# Patient Record
Sex: Female | Born: 1935 | Race: Black or African American | Hispanic: No | Smoking: Never smoker
Health system: Southern US, Community
[De-identification: ages and names within clinical notes are randomized; demographics above are authoritative.]

## PROBLEM LIST (undated history)

## (undated) DIAGNOSIS — I43 Cardiomyopathy in diseases classified elsewhere: Secondary | ICD-10-CM

## (undated) DIAGNOSIS — A048 Other specified bacterial intestinal infections: Secondary | ICD-10-CM

## (undated) DIAGNOSIS — M1990 Unspecified osteoarthritis, unspecified site: Secondary | ICD-10-CM

## (undated) DIAGNOSIS — I4891 Unspecified atrial fibrillation: Secondary | ICD-10-CM

## (undated) DIAGNOSIS — N289 Disorder of kidney and ureter, unspecified: Secondary | ICD-10-CM

## (undated) DIAGNOSIS — I519 Heart disease, unspecified: Secondary | ICD-10-CM

## (undated) DIAGNOSIS — E7800 Pure hypercholesterolemia, unspecified: Secondary | ICD-10-CM

## (undated) DIAGNOSIS — E785 Hyperlipidemia, unspecified: Secondary | ICD-10-CM

## (undated) DIAGNOSIS — G629 Polyneuropathy, unspecified: Secondary | ICD-10-CM

## (undated) DIAGNOSIS — K559 Vascular disorder of intestine, unspecified: Secondary | ICD-10-CM

## (undated) DIAGNOSIS — I872 Venous insufficiency (chronic) (peripheral): Secondary | ICD-10-CM

## (undated) DIAGNOSIS — K3184 Gastroparesis: Secondary | ICD-10-CM

## (undated) DIAGNOSIS — I10 Essential (primary) hypertension: Secondary | ICD-10-CM

## (undated) DIAGNOSIS — K5792 Diverticulitis of intestine, part unspecified, without perforation or abscess without bleeding: Secondary | ICD-10-CM

## (undated) DIAGNOSIS — E119 Type 2 diabetes mellitus without complications: Secondary | ICD-10-CM

## (undated) DIAGNOSIS — H409 Unspecified glaucoma: Secondary | ICD-10-CM

## (undated) DIAGNOSIS — H3500 Unspecified background retinopathy: Secondary | ICD-10-CM

## (undated) DIAGNOSIS — E11319 Type 2 diabetes mellitus with unspecified diabetic retinopathy without macular edema: Secondary | ICD-10-CM

## (undated) DIAGNOSIS — M8580 Other specified disorders of bone density and structure, unspecified site: Secondary | ICD-10-CM

## (undated) DIAGNOSIS — K5790 Diverticulosis of intestine, part unspecified, without perforation or abscess without bleeding: Secondary | ICD-10-CM

## (undated) DIAGNOSIS — I119 Hypertensive heart disease without heart failure: Secondary | ICD-10-CM

## (undated) DIAGNOSIS — I509 Heart failure, unspecified: Secondary | ICD-10-CM

## (undated) HISTORY — DX: Diverticulosis of intestine, part unspecified, without perforation or abscess without bleeding: K57.90

## (undated) HISTORY — DX: Hyperlipidemia, unspecified: E78.5

## (undated) HISTORY — DX: Unspecified osteoarthritis, unspecified site: M19.90

## (undated) HISTORY — DX: Other specified disorders of bone density and structure, unspecified site: M85.80

## (undated) HISTORY — DX: Unspecified glaucoma: H40.9

## (undated) HISTORY — PX: EXPLORATORY LAPAROTOMY W/ BOWEL RESECTION: SHX1544

## (undated) HISTORY — PX: CATARACT EXTRACTION, BILATERAL: SHX1313

## (undated) HISTORY — DX: Type 2 diabetes mellitus with unspecified diabetic retinopathy without macular edema: E11.319

## (undated) HISTORY — PX: TUBAL LIGATION: SHX77

## (undated) HISTORY — PX: VAGINAL HYSTERECTOMY: SUR661

## (undated) HISTORY — DX: Unspecified background retinopathy: H35.00

## (undated) HISTORY — DX: Diverticulitis of intestine, part unspecified, without perforation or abscess without bleeding: K57.92

## (undated) HISTORY — DX: Polyneuropathy, unspecified: G62.9

## (undated) HISTORY — DX: Disorder of kidney and ureter, unspecified: N28.9

## (undated) HISTORY — DX: Other specified bacterial intestinal infections: A04.8

## (undated) HISTORY — DX: Gastroparesis: K31.84

## (undated) HISTORY — DX: Venous insufficiency (chronic) (peripheral): I87.2

## (undated) HISTORY — DX: Hypertensive heart disease without heart failure: I11.9

## (undated) HISTORY — DX: Cardiomyopathy in diseases classified elsewhere: I43

## (undated) HISTORY — PX: OTHER SURGICAL HISTORY: SHX169

## (undated) HISTORY — PX: COLON SURGERY: SHX602

## (undated) HISTORY — DX: Vascular disorder of intestine, unspecified: K55.9

---

## 2016-07-15 ENCOUNTER — Encounter: Payer: Self-pay | Admitting: Family Medicine

## 2016-09-20 ENCOUNTER — Encounter: Payer: Self-pay | Admitting: Family Medicine

## 2016-09-20 LAB — HM DIABETES EYE EXAM

## 2016-12-23 ENCOUNTER — Ambulatory Visit (HOSPITAL_COMMUNITY)
Admission: EM | Admit: 2016-12-23 | Discharge: 2016-12-23 | Disposition: A | Discharge status: Home / Self Care | Payer: Medicare Other | Attending: Family Medicine | Admitting: Family Medicine

## 2016-12-23 ENCOUNTER — Encounter (HOSPITAL_COMMUNITY): Payer: Self-pay

## 2016-12-23 DIAGNOSIS — R059 Cough, unspecified: Secondary | ICD-10-CM

## 2016-12-23 DIAGNOSIS — J209 Acute bronchitis, unspecified: Secondary | ICD-10-CM

## 2016-12-23 DIAGNOSIS — R05 Cough: Secondary | ICD-10-CM | POA: Diagnosis not present

## 2016-12-23 HISTORY — DX: Essential (primary) hypertension: I10

## 2016-12-23 HISTORY — DX: Pure hypercholesterolemia, unspecified: E78.00

## 2016-12-23 HISTORY — DX: Heart disease, unspecified: I51.9

## 2016-12-23 MED ORDER — AZITHROMYCIN 250 MG PO TABS: 250.0000 mg | ORAL_TABLET | Freq: Every day | ORAL | 0 refills | Status: DC

## 2016-12-23 MED ORDER — PREDNISONE 10 MG PO TABS: 20.0000 mg | ORAL_TABLET | Freq: Every day | ORAL | 0 refills | Status: DC

## 2016-12-23 MED ORDER — BENZONATATE 100 MG PO CAPS: 100.0000 mg | ORAL_CAPSULE | Freq: Three times a day (TID) | ORAL | 0 refills | Status: DC

## 2016-12-23 NOTE — ED Provider Notes (Signed)
CSN: [id number]     Arrival date & time 12/23/16  1046 History   First MD Initiated Contact with Patient 12/23/16 1201     Chief Complaint  Patient presents with  . Cough   (Consider location/radiation/quality/duration/timing/severity/associated sxs/prior Treatment) Patient is having a cough, chills, and sore throat for 2 weeks.   The history is provided by the patient.  Cough  Cough characteristics:  Productive Sputum characteristics:  Yellow Severity:  Moderate Onset quality:  Sudden Duration:  2 weeks Timing:  Constant Progression:  Worsening Chronicity:  New Smoker: yes   Context: upper respiratory infection and weather changes   Relieved by:  Rest Worsened by:  Nothing Ineffective treatments:  None tried Associated symptoms: chills, sinus congestion and wheezing     Past Medical History:  Diagnosis Date  . Diabetes mellitus without complication (HCC)   . Heart disease   . Hypercholesteremia   . Hypertension    History reviewed. No pertinent surgical history. No family history on file. Social History  Substance Use Topics  . Smoking status: Never Smoker  . Smokeless tobacco: Never Used  . Alcohol use No   OB History    No data available     Review of Systems  Constitutional: Positive for chills.  Eyes: Negative.   Respiratory: Positive for cough and wheezing.   Cardiovascular: Negative.   Gastrointestinal: Negative.   Endocrine: Negative.   Genitourinary: Negative.   Musculoskeletal: Negative.   Allergic/Immunologic: Negative.   Neurological: Negative.   Hematological: Negative.   Psychiatric/Behavioral: Negative.     Allergies  Patient has no known allergies.  Home Medications   Prior to Admission medications   Medication Sig Start Date End Date Taking? Authorizing Provider  azithromycin (ZITHROMAX) 250 MG tablet Take 1 tablet (250 mg total) by mouth daily. Take first 2 tablets together, then 1 every day until finished. 12/23/16   Deatra CanterWilliam J  Arnett Duddy, FNP  benzonatate (TESSALON) 100 MG capsule Take 1 capsule (100 mg total) by mouth every 8 (eight) hours. 12/23/16   Deatra CanterWilliam J Lashaunda Schild, FNP  predniSONE (DELTASONE) 10 MG tablet Take 2 tablets (20 mg total) by mouth daily. 12/23/16   Deatra CanterWilliam J Chemeka Filice, FNP   Meds Ordered and Administered this Visit  Medications - No data to display  BP 135/75 (BP Location: Right Arm)   Pulse 79   Temp 98.1 F (36.7 C)   Resp 20   SpO2 99%  No data found.   Physical Exam  Constitutional: She appears well-developed and well-nourished.  HENT:  Head: Normocephalic and atraumatic.  Right Ear: External ear normal.  Left Ear: External ear normal.  Mouth/Throat: Oropharynx is clear and moist.  Eyes: Conjunctivae and EOM are normal. Pupils are equal, round, and reactive to light.  Neck: Normal range of motion. Neck supple.  Cardiovascular: Normal rate, regular rhythm and normal heart sounds.   Pulmonary/Chest: Effort normal. She has wheezes.  Abdominal: Soft. Bowel sounds are normal.  Nursing note and vitals reviewed.   Urgent Care Course     Procedures (including critical care time)  Labs Review Labs Reviewed - No data to display  Imaging Review No results found.   Visual Acuity Review  Right Eye Distance:   Left Eye Distance:   Bilateral Distance:    Right Eye Near:   Left Eye Near:    Bilateral Near:         MDM   1. Cough   2. Acute bronchitis, unspecified organism    Zpak  as directed Prednisone 10mg  2 po qd #10 Tessalon Perles 100mg  one po tid prn #20      Deatra Canter, FNP 12/23/16 1233    Deatra Canter, FNP 12/23/16 1234    Deatra Canter, FNP 12/23/16 1234

## 2016-12-23 NOTE — ED Triage Notes (Signed)
Pt having productive cough, chills, sore throat for about 2 weeks. No fever. Also having wheezing and SOB. Taking tussin.

## 2017-07-28 ENCOUNTER — Encounter: Payer: Self-pay | Admitting: Family Medicine

## 2018-02-26 ENCOUNTER — Encounter: Payer: Self-pay | Admitting: Family Medicine

## 2018-04-05 ENCOUNTER — Encounter: Payer: Self-pay | Admitting: Family Medicine

## 2018-04-16 ENCOUNTER — Encounter: Payer: Self-pay | Admitting: Family Medicine

## 2018-04-16 LAB — HM DIABETES EYE EXAM

## 2018-04-26 ENCOUNTER — Emergency Department (HOSPITAL_COMMUNITY)
Admission: EM | Admit: 2018-04-26 | Discharge: 2018-04-26 | Disposition: A | Discharge status: Home / Self Care | Payer: Medicare Other | Attending: Emergency Medicine | Admitting: Emergency Medicine

## 2018-04-26 ENCOUNTER — Emergency Department (HOSPITAL_COMMUNITY): Payer: Medicare Other

## 2018-04-26 ENCOUNTER — Encounter (HOSPITAL_COMMUNITY): Payer: Self-pay | Admitting: Emergency Medicine

## 2018-04-26 ENCOUNTER — Other Ambulatory Visit: Payer: Self-pay

## 2018-04-26 DIAGNOSIS — I10 Essential (primary) hypertension: Secondary | ICD-10-CM | POA: Insufficient documentation

## 2018-04-26 DIAGNOSIS — K5790 Diverticulosis of intestine, part unspecified, without perforation or abscess without bleeding: Secondary | ICD-10-CM

## 2018-04-26 DIAGNOSIS — K529 Noninfective gastroenteritis and colitis, unspecified: Secondary | ICD-10-CM

## 2018-04-26 DIAGNOSIS — E119 Type 2 diabetes mellitus without complications: Secondary | ICD-10-CM | POA: Insufficient documentation

## 2018-04-26 DIAGNOSIS — R1084 Generalized abdominal pain: Secondary | ICD-10-CM | POA: Insufficient documentation

## 2018-04-26 DIAGNOSIS — R109 Unspecified abdominal pain: Secondary | ICD-10-CM

## 2018-04-26 LAB — COMPREHENSIVE METABOLIC PANEL
ALT: 16 U/L (ref 14–54)
AST: 16 U/L (ref 15–41)
Albumin: 3.3 g/dL — ABNORMAL LOW (ref 3.5–5.0)
Alkaline Phosphatase: 40 U/L (ref 38–126)
Anion gap: 12 (ref 5–15)
BUN: 10 mg/dL (ref 6–20)
CHLORIDE: 99 mmol/L — AB (ref 101–111)
CO2: 26 mmol/L (ref 22–32)
Calcium: 9.3 mg/dL (ref 8.9–10.3)
Creatinine, Ser: 1.24 mg/dL — ABNORMAL HIGH (ref 0.44–1.00)
GFR calc Af Amer: 45 mL/min — ABNORMAL LOW (ref >60)
GFR calc non Af Amer: 39 mL/min — ABNORMAL LOW (ref >60)
GLUCOSE: 193 mg/dL — AB (ref 65–99)
POTASSIUM: 4.1 mmol/L (ref 3.5–5.1)
SODIUM: 137 mmol/L (ref 135–145)
Total Bilirubin: 1.5 mg/dL — ABNORMAL HIGH (ref 0.3–1.2)
Total Protein: 7.2 g/dL (ref 6.5–8.1)

## 2018-04-26 LAB — CBC
HEMATOCRIT: 39.3 % (ref 36.0–46.0)
Hemoglobin: 12.4 g/dL (ref 12.0–15.0)
MCH: 28.1 pg (ref 26.0–34.0)
MCHC: 31.6 g/dL (ref 30.0–36.0)
MCV: 89.1 fL (ref 78.0–100.0)
Platelets: 205 10*3/uL (ref 150–400)
RBC: 4.41 MIL/uL (ref 3.87–5.11)
RDW: 15 % (ref 11.5–15.5)
WBC: 12 10*3/uL — AB (ref 4.0–10.5)

## 2018-04-26 LAB — CBG MONITORING, ED: Glucose-Capillary: 135 mg/dL — ABNORMAL HIGH (ref 65–99)

## 2018-04-26 LAB — LIPASE, BLOOD: LIPASE: 30 U/L (ref 11–51)

## 2018-04-26 MED ORDER — IOHEXOL 300 MG/ML  SOLN
100.0000 mL | Freq: Once | INTRAMUSCULAR | Status: AC
  Administered 2018-04-26: 75 mL via INTRAVENOUS

## 2018-04-26 MED ORDER — CIPROFLOXACIN HCL 500 MG PO TABS: 500.0000 mg | ORAL_TABLET | Freq: Two times a day (BID) | ORAL | 0 refills | Status: AC

## 2018-04-26 MED ORDER — METRONIDAZOLE 500 MG PO TABS
500.0000 mg | ORAL_TABLET | Freq: Two times a day (BID) | ORAL | 0 refills | Status: AC
Start: 2018-04-26 — End: 2018-05-10

## 2018-04-26 MED ORDER — LACTATED RINGERS IV BOLUS
500.0000 mL | Freq: Once | INTRAVENOUS | Status: AC
  Administered 2018-04-26: 500 mL via INTRAVENOUS

## 2018-04-26 NOTE — ED Triage Notes (Signed)
Patient complains of nausea, diarrhea, and abdominal pain since yesterday. Denies vomiting. Reports generalized weakness since after onset of symptoms.

## 2018-04-26 NOTE — ED Notes (Signed)
Patient transported to CT 

## 2018-04-26 NOTE — Progress Notes (Signed)
IV extravasation after injection of 30 ml of saline; in left AC.  Patient had a 20 g Angiocath.  IV removed after injection

## 2018-04-26 NOTE — ED Provider Notes (Signed)
[facility] EMERGENCY DEPARTMENT Provider Note   CSN: [id number] Arrival date & time: 04/26/18  1238   History   Chief Complaint Chief Complaint  Patient presents with  . Abdominal Pain    HPI Chelsea Ward is a 84 y.o. female.  The history is provided by the patient and a relative.  Abdominal Pain   This is a new problem. The current episode started yesterday. The problem occurs constantly. The problem has been gradually worsening. Associated with: diarrhea. The pain is located in the generalized abdominal region. The quality of the pain is cramping. The pain is moderate. Associated symptoms include diarrhea and nausea. Pertinent negatives include fever, vomiting, dysuria, hematuria and arthralgias. Nothing aggravates the symptoms. Nothing relieves the symptoms. Past workup includes surgery (Reports abd surgery last year but unclear what for).    Past Medical History:  Diagnosis Date  . Diabetes mellitus without complication (HCC)   . Heart disease   . Hypercholesteremia   . Hypertension     There are no active problems to display for this patient.   History reviewed. No pertinent surgical history.   OB History   None      Home Medications    Prior to Admission medications   Medication Sig Start Date End Date Taking? Authorizing Provider  azithromycin (ZITHROMAX) 250 MG tablet Take 1 tablet (250 mg total) by mouth daily. Take first 2 tablets together, then 1 every day until finished. 12/23/16   Deatra Canter, FNP  benzonatate (TESSALON) 100 MG capsule Take 1 capsule (100 mg total) by mouth every 8 (eight) hours. 12/23/16   Deatra Canter, FNP  predniSONE (DELTASONE) 10 MG tablet Take 2 tablets (20 mg total) by mouth daily. 12/23/16   Deatra Canter, FNP    Family History No family history on file.  Social History Social History   Tobacco Use  . Smoking status: Never Smoker  . Smokeless tobacco: Never Used  Substance Use Topics  .  Alcohol use: No  . Drug use: Not on file     Allergies   Patient has no known allergies.   Review of Systems Review of Systems  Constitutional: Negative for chills and fever.  HENT: Negative for ear pain and sore throat.   Eyes: Negative for pain and visual disturbance.  Respiratory: Negative for cough and shortness of breath.   Cardiovascular: Negative for chest pain and palpitations.  Gastrointestinal: Positive for abdominal pain, diarrhea and nausea. Negative for vomiting.  Genitourinary: Negative for dysuria and hematuria.  Musculoskeletal: Negative for arthralgias and back pain.  Skin: Negative for color change and rash.  Neurological: Negative for seizures and syncope.  All other systems reviewed and are negative.    Physical Exam Updated Vital Signs BP 107/88   Pulse 98   Temp 97.9 F (36.6 C)   Resp (!) 22   SpO2 96%   Physical Exam  Constitutional: She appears well-developed and well-nourished. No distress.  HENT:  Head: Normocephalic and atraumatic.  Dry mucous membranes  Eyes: Conjunctivae are normal.  Neck: Neck supple.  Cardiovascular: Normal rate and regular rhythm.  No murmur heard. Pulmonary/Chest: Effort normal and breath sounds normal. No respiratory distress.  Abdominal: Soft. There is tenderness.  Mild diffuse abdominal tenderness  Musculoskeletal: She exhibits no edema.  Neurological: She is alert.  Skin: Skin is warm and dry.  Psychiatric: She has a normal mood and affect.  Nursing note and vitals reviewed.    ED Treatments /  Results  Labs (all labs ordered are listed, but only abnormal results are displayed) Labs Reviewed  COMPREHENSIVE METABOLIC PANEL - Abnormal; Notable for the following components:      Result Value   Chloride 99 (*)    Glucose, Bld 193 (*)    Creatinine, Ser 1.24 (*)    Albumin 3.3 (*)    Total Bilirubin 1.5 (*)    GFR calc non Af Amer 39 (*)    GFR calc Af Amer 45 (*)    All other components within normal  limits  CBC - Abnormal; Notable for the following components:   WBC 12.0 (*)    All other components within normal limits  LIPASE, BLOOD  URINALYSIS, ROUTINE W REFLEX MICROSCOPIC    EKG None  Radiology No results found.  Procedures Procedures (including critical care time)  Medications Ordered in ED Medications  lactated ringers bolus 500 mL (500 mLs Intravenous New Bag/Given 04/26/18 1352)     Initial Impression / Assessment and Plan / ED Course  I have reviewed the triage vital signs and the nursing notes.  Pertinent labs & imaging results that were available during my care of the patient were reviewed by me and considered in my medical decision making (see chart for details).    Patient is an 84 year old female with history of hypertension and hyperlipidemia and an unknown abdominal surgery about 1 year ago, who presents with 2 days of diarrhea and generalized abdominal pain.  She also endorses generalized weakness.  She is nauseated but has not had any vomiting.  Here, she is chronically ill-appearing.  She is in no distress.  She has mild diffuse abdominal pain.  Given her age and risk factors, CT abdomen ordered.  Labs are notable for mildly elevated white blood cell count and mildly elevated T bili.  Patient signed out to oncoming team at around 3 PM awaiting CT abdomen and pelvis.  Final Clinical Impressions(s) / ED Diagnoses   Final diagnoses:  Abdominal pain, unspecified abdominal location    ED Discharge Orders    None       Lennette Bihari, MD 04/26/18 1517    Loren Racer, MD 04/27/18 [phone]

## 2018-04-26 NOTE — Progress Notes (Signed)
  Called to CT scan to evaluate left arm for extravasation.  CT tech reports patient was complaining her IV was "stinging" after injection of 30 mL of Saline (NOT Contrast).  The injection was immediately stopped and IV removed.  The patient has some edema of the arm above the AC fossa.  It is soft, non-tender.  She has good ROM in the left hand and fingers.   Good pulses. Neurologically normal.  Recommend ice pack and elevation.  If patient is admitted, will check her arm again tomorrow.  Jermane Brayboy S Kalum Minner PA-C 04/26/2018 4:50 PM

## 2018-04-26 NOTE — ED Provider Notes (Signed)
Assume care from Dr. Alesia Banda at 1500, please see their documentation for complete history and physical.  I have reviewed documentation and agree with previous providers assessment.  Patient is a 84 y.o. with past medical history as below.   Plan is to f/u CT results  Patient CT revealed proctocolitis with diverticulosis.  Will discharge patient with prescription for Cipro Flagyl and instruct her to follow-up for further evaluation and care. Pt given appropriate f/u and return precautions. Pt voiced understanding and is agreeable to discharge at this time.    Past Medical History:  Diagnosis Date  . Diabetes mellitus without complication (HCC)   . Heart disease   . Hypercholesteremia   . Hypertension        Caren Griffins, MD 04/26/18 2354    Gerhard Munch, MD 04/28/18 Perlie Mayo

## 2018-05-15 ENCOUNTER — Emergency Department (HOSPITAL_COMMUNITY): Payer: Medicare Other

## 2018-05-15 ENCOUNTER — Inpatient Hospital Stay (HOSPITAL_COMMUNITY)
Admission: EM | Admit: 2018-05-15 | Discharge: 2018-05-23 | DRG: 371 | Disposition: A | Discharge status: Home / Self Care | Payer: Medicare Other | Attending: Family Medicine | Admitting: Family Medicine

## 2018-05-15 ENCOUNTER — Encounter (HOSPITAL_COMMUNITY): Payer: Self-pay | Admitting: Emergency Medicine

## 2018-05-15 DIAGNOSIS — R0902 Hypoxemia: Secondary | ICD-10-CM

## 2018-05-15 DIAGNOSIS — R531 Weakness: Secondary | ICD-10-CM | POA: Diagnosis not present

## 2018-05-15 DIAGNOSIS — A0472 Enterocolitis due to Clostridium difficile, not specified as recurrent: Secondary | ICD-10-CM | POA: Diagnosis present

## 2018-05-15 DIAGNOSIS — E119 Type 2 diabetes mellitus without complications: Secondary | ICD-10-CM

## 2018-05-15 DIAGNOSIS — R6510 Systemic inflammatory response syndrome (SIRS) of non-infectious origin without acute organ dysfunction: Secondary | ICD-10-CM | POA: Diagnosis present

## 2018-05-15 DIAGNOSIS — E785 Hyperlipidemia, unspecified: Secondary | ICD-10-CM | POA: Diagnosis present

## 2018-05-15 DIAGNOSIS — I493 Ventricular premature depolarization: Secondary | ICD-10-CM | POA: Diagnosis present

## 2018-05-15 DIAGNOSIS — N182 Chronic kidney disease, stage 2 (mild): Secondary | ICD-10-CM | POA: Diagnosis present

## 2018-05-15 DIAGNOSIS — E860 Dehydration: Secondary | ICD-10-CM | POA: Diagnosis present

## 2018-05-15 DIAGNOSIS — E1122 Type 2 diabetes mellitus with diabetic chronic kidney disease: Secondary | ICD-10-CM | POA: Diagnosis present

## 2018-05-15 DIAGNOSIS — I130 Hypertensive heart and chronic kidney disease with heart failure and stage 1 through stage 4 chronic kidney disease, or unspecified chronic kidney disease: Secondary | ICD-10-CM | POA: Diagnosis present

## 2018-05-15 DIAGNOSIS — K529 Noninfective gastroenteritis and colitis, unspecified: Secondary | ICD-10-CM

## 2018-05-15 DIAGNOSIS — Z833 Family history of diabetes mellitus: Secondary | ICD-10-CM

## 2018-05-15 DIAGNOSIS — R000 Tachycardia, unspecified: Secondary | ICD-10-CM | POA: Diagnosis not present

## 2018-05-15 DIAGNOSIS — I5023 Acute on chronic systolic (congestive) heart failure: Secondary | ICD-10-CM | POA: Diagnosis present

## 2018-05-15 DIAGNOSIS — R197 Diarrhea, unspecified: Secondary | ICD-10-CM | POA: Diagnosis present

## 2018-05-15 LAB — CBC WITH DIFFERENTIAL/PLATELET
ABS IMMATURE GRANULOCYTES: 0.1 10*3/uL (ref 0.0–0.1)
BASOS PCT: 0 %
Basophils Absolute: 0 10*3/uL (ref 0.0–0.1)
Eosinophils Absolute: 0 10*3/uL (ref 0.0–0.7)
Eosinophils Relative: 0 %
HCT: 40.9 % (ref 36.0–46.0)
HEMOGLOBIN: 12.5 g/dL (ref 12.0–15.0)
Immature Granulocytes: 1 %
Lymphocytes Relative: 7 %
Lymphs Abs: 0.9 10*3/uL (ref 0.7–4.0)
MCH: 28.5 pg (ref 26.0–34.0)
MCHC: 30.6 g/dL (ref 30.0–36.0)
MCV: 93.2 fL (ref 78.0–100.0)
Monocytes Absolute: 0.7 10*3/uL (ref 0.1–1.0)
Monocytes Relative: 5 %
Neutro Abs: 10.6 10*3/uL — ABNORMAL HIGH (ref 1.7–7.7)
Neutrophils Relative %: 87 %
PLATELETS: 191 10*3/uL (ref 150–400)
RBC: 4.39 MIL/uL (ref 3.87–5.11)
RDW: 16.1 % — ABNORMAL HIGH (ref 11.5–15.5)
WBC: 12.2 10*3/uL — ABNORMAL HIGH (ref 4.0–10.5)

## 2018-05-15 LAB — BASIC METABOLIC PANEL
Anion gap: 14 (ref 5–15)
BUN: 11 mg/dL (ref 6–20)
CHLORIDE: 103 mmol/L (ref 101–111)
CO2: 21 mmol/L — AB (ref 22–32)
CREATININE: 1.16 mg/dL — AB (ref 0.44–1.00)
Calcium: 8.3 mg/dL — ABNORMAL LOW (ref 8.9–10.3)
GFR calc Af Amer: 49 mL/min — ABNORMAL LOW (ref >60)
GFR calc non Af Amer: 42 mL/min — ABNORMAL LOW (ref >60)
Glucose, Bld: 176 mg/dL — ABNORMAL HIGH (ref 65–99)
Potassium: 3.4 mmol/L — ABNORMAL LOW (ref 3.5–5.1)
Sodium: 138 mmol/L (ref 135–145)

## 2018-05-15 LAB — I-STAT CG4 LACTIC ACID, ED: LACTIC ACID, VENOUS: 1.53 mmol/L (ref 0.5–1.9)

## 2018-05-15 LAB — I-STAT TROPONIN, ED: TROPONIN I, POC: 0.03 ng/mL (ref 0.00–0.08)

## 2018-05-15 LAB — I-STAT CHEM 8, ED
BUN: 11 mg/dL (ref 6–20)
Calcium, Ion: 0.9 mmol/L — ABNORMAL LOW (ref 1.15–1.40)
Chloride: 103 mmol/L (ref 101–111)
Creatinine, Ser: 1 mg/dL (ref 0.44–1.00)
GLUCOSE: 187 mg/dL — AB (ref 65–99)
HCT: 39 % (ref 36.0–46.0)
Hemoglobin: 13.3 g/dL (ref 12.0–15.0)
Potassium: 3.3 mmol/L — ABNORMAL LOW (ref 3.5–5.1)
SODIUM: 139 mmol/L (ref 135–145)
TCO2: 23 mmol/L (ref 22–32)

## 2018-05-15 LAB — CBG MONITORING, ED: Glucose-Capillary: 141 mg/dL — ABNORMAL HIGH (ref 65–99)

## 2018-05-15 MED ORDER — SODIUM CHLORIDE 0.9 % IV BOLUS
500.0000 mL | Freq: Once | INTRAVENOUS | Status: AC
  Administered 2018-05-15: 500 mL via INTRAVENOUS

## 2018-05-15 NOTE — ED Triage Notes (Signed)
Pt presents from home with [facility] for generalized weakness that began at around 4-5p; family states she was normal prior to this time; pt now c/o n/v; per family currrenly at baseline

## 2018-05-15 NOTE — ED Provider Notes (Signed)
[facility]   CSN: [id number] Arrival date & time: 05/15/18  2126     History   Chief Complaint Chief Complaint  Patient presents with  . Weakness    HPI Chelsea Ward is a 84 y.o. female.  Patient is a 84 year old female who presents with generalized weakness.  She has a history of diabetes, hypertension, hyperlipidemia.  She was recently seen at the end of May for abdominal pain and diagnosed with proctocolitis and diverticulitis.  She was started on Cipro and Flagyl.  She recently finished her course of those medications.  She was doing well until today.  Her daughter states that she was doing fine earlier this morning but this evening started having some chills and generalized weakness.  She was unable to stand unassisted.  She has had some episodes of vomiting which started since she has arrived in the emergency room.  She is complaining of some abdominal pain which also started today.  She denies any recent diarrhea.  She felt hot but they did not check her temperature.  She denies any urinary symptoms.     Past Medical History:  Diagnosis Date  . Diabetes mellitus without complication (HCC)   . Heart disease   . Hypercholesteremia   . Hypertension     There are no active problems to display for this patient.   History reviewed. No pertinent surgical history.   OB History   None      Home Medications    Prior to Admission medications   Medication Sig Start Date End Date Taking? Authorizing Provider  amoxicillin-clavulanate (AUGMENTIN) 875-125 MG tablet Take 1 tablet by mouth 2 (two) times daily. 04/11/18   [provider]  aspirin EC 81 MG tablet Take 81 mg by mouth daily.    [provider]  azithromycin (ZITHROMAX) 250 MG tablet Take 1 tablet (250 mg total) by mouth daily. Take first 2 tablets together, then 1 every day until finished. Patient not taking: Reported on 04/26/2018 12/23/16    Deatra Canterxford, William J, FNP  benzonatate (TESSALON) 100 MG capsule Take 1 capsule (100 mg total) by mouth every 8 (eight) hours. Patient not taking: Reported on 04/26/2018 12/23/16   Deatra Canterxford, William J, FNP  ezetimibe (ZETIA) 10 MG tablet Take 10 mg by mouth daily. 01/29/18   [provider]  furosemide (LASIX) 20 MG tablet Take 20 mg by mouth daily.    [provider]  JANUVIA 100 MG tablet Take 100 mg by mouth daily. 03/02/18   [provider]  Omega-3 Fatty Acids (FISH OIL) 500 MG CAPS Take 1 capsule by mouth 2 (two) times daily.    [provider]  omeprazole (PRILOSEC) 20 MG capsule Take 20 mg by mouth 2 (two) times daily.    [provider]  predniSONE (DELTASONE) 10 MG tablet Take 2 tablets (20 mg total) by mouth daily. Patient not taking: Reported on 04/26/2018 12/23/16   Deatra Canterxford, William J, FNP    Family History History reviewed. No pertinent family history.  Social History Social History   Tobacco Use  . Smoking status: Never Smoker  . Smokeless tobacco: Never Used  Substance Use Topics  . Alcohol use: No  . Drug use: Not on file     Allergies   Patient has no known allergies.   Review of Systems Review of Systems  Constitutional: Positive for fatigue and fever. Negative for chills and diaphoresis.  HENT: Negative for congestion, rhinorrhea and  sneezing.   Eyes: Negative.   Respiratory: Positive for cough. Negative for chest tightness and shortness of breath.   Cardiovascular: Negative for chest pain and leg swelling.  Gastrointestinal: Positive for abdominal pain, nausea and vomiting. Negative for blood in stool and diarrhea.  Genitourinary: Negative for difficulty urinating, flank pain, frequency and hematuria.  Musculoskeletal: Negative for arthralgias and back pain.  Skin: Negative for rash.  Neurological: Negative for dizziness, speech difficulty, weakness, numbness and headaches.     Physical Exam Updated Vital Signs BP  (!) 156/67   Pulse 94   Temp (!) 102.5 F (39.2 C) (Rectal)   Resp 20   SpO2 96%   Physical Exam  Constitutional: She is oriented to person, place, and time. She appears well-developed and well-nourished. She appears distressed.  HENT:  Head: Normocephalic and atraumatic.  Eyes: Pupils are equal, round, and reactive to light.  Neck: Normal range of motion. Neck supple.  Cardiovascular: Normal rate, regular rhythm and normal heart sounds.  Pulmonary/Chest: Effort normal and breath sounds normal. No respiratory distress. She has no wheezes. She has no rales. She exhibits no tenderness.  Abdominal: Soft. Bowel sounds are normal. There is tenderness (Mild generalized tenderness). There is no rebound and no guarding.  Musculoskeletal: Normal range of motion. She exhibits no edema.  Lymphadenopathy:    She has no cervical adenopathy.  Neurological: She is alert and oriented to person, place, and time.  Skin: Skin is warm and dry. No rash noted.  Psychiatric: She has a normal mood and affect.     ED Treatments / Results  Labs (all labs ordered are listed, but only abnormal results are displayed) Labs Reviewed  CBC WITH DIFFERENTIAL/PLATELET - Abnormal; Notable for the following components:      Result Value   WBC 12.2 (*)    RDW 16.1 (*)    Neutro Abs 10.6 (*)    All other components within normal limits  CBG MONITORING, ED - Abnormal; Notable for the following components:   Glucose-Capillary 141 (*)    All other components within normal limits  I-STAT CHEM 8, ED - Abnormal; Notable for the following components:   Potassium 3.3 (*)    Glucose, Bld 187 (*)    Calcium, Ion 0.90 (*)    All other components within normal limits  CULTURE, BLOOD (ROUTINE X 2)  CULTURE, BLOOD (ROUTINE X 2)  BASIC METABOLIC PANEL  URINALYSIS, ROUTINE W REFLEX MICROSCOPIC  I-STAT TROPONIN, ED  I-STAT CG4 LACTIC ACID, ED  I-STAT CG4 LACTIC ACID, ED    EKG EKG Interpretation  Date/Time:  Tuesday  May 15 2018 22:56:08 EDT Ventricular Rate:  93 PR Interval:    QRS Duration: 86 QT Interval:  324 QTC Calculation: 403 R Axis:   -36 Text Interpretation:  Sinus tachycardia Multiform ventricular premature complexes Abnormal R-wave progression, early transition Left ventricular hypertrophy since last tracing no significant change Confirmed by Rolan Bucco [phone]) on 05/15/2018 11:36:15 PM   Radiology Dg Chest 2 View  Result Date: 05/15/2018 CLINICAL DATA:  Generalized weakness. EXAM: CHEST - 2 VIEW COMPARISON:  None. FINDINGS: Mild cardiomegaly. The hila and mediastinum are unremarkable. No pulmonary nodules or masses. Mild opacity in left base is favored to be atelectasis. No suspicious infiltrate. No overt edema. IMPRESSION: Mild atelectasis in the left base. Electronically Signed   By: Gerome Sam III M.D   On: 05/15/2018 22:34    Procedures Procedures (including critical care time)  Medications Ordered in ED Medications  sodium chloride 0.9 % bolus 500 mL (500 mLs Intravenous New Bag/Given 05/15/18 2331)     Initial Impression / Assessment and Plan / ED Course  I have reviewed the triage vital signs and the nursing notes.  Pertinent labs & imaging results that were available during my care of the patient were reviewed by me and considered in my medical decision making (see chart for details).     Patient is a 84 year old female who presents with fever, generalized weakness and vomiting with abdominal pain.  Chest x-ray is clear without evidence of pneumonia.  Her temperature is 102.5.  Her lactate is normal.  She does not have other sirs criteria for sepsis.  Her urinalysis and remainder of her blood work are still pending.  CT scan of her abdomen pelvis has been ordered and is pending.  Dr. Judd Lien to take over care and follow-up on results.  Final Clinical Impressions(s) / ED Diagnoses   Final diagnoses:  None    ED Discharge Orders    None       Rolan Bucco,  MD 05/15/18 2354

## 2018-05-16 ENCOUNTER — Emergency Department (HOSPITAL_COMMUNITY): Payer: Medicare Other

## 2018-05-16 ENCOUNTER — Encounter (HOSPITAL_COMMUNITY): Payer: Self-pay | Admitting: Internal Medicine

## 2018-05-16 ENCOUNTER — Other Ambulatory Visit: Payer: Self-pay

## 2018-05-16 DIAGNOSIS — N182 Chronic kidney disease, stage 2 (mild): Secondary | ICD-10-CM | POA: Diagnosis present

## 2018-05-16 DIAGNOSIS — R531 Weakness: Secondary | ICD-10-CM | POA: Diagnosis present

## 2018-05-16 DIAGNOSIS — E860 Dehydration: Secondary | ICD-10-CM | POA: Diagnosis present

## 2018-05-16 DIAGNOSIS — E119 Type 2 diabetes mellitus without complications: Secondary | ICD-10-CM

## 2018-05-16 DIAGNOSIS — R0600 Dyspnea, unspecified: Secondary | ICD-10-CM | POA: Diagnosis not present

## 2018-05-16 DIAGNOSIS — I493 Ventricular premature depolarization: Secondary | ICD-10-CM | POA: Diagnosis present

## 2018-05-16 DIAGNOSIS — A09 Infectious gastroenteritis and colitis, unspecified: Secondary | ICD-10-CM

## 2018-05-16 DIAGNOSIS — R6510 Systemic inflammatory response syndrome (SIRS) of non-infectious origin without acute organ dysfunction: Secondary | ICD-10-CM | POA: Diagnosis present

## 2018-05-16 DIAGNOSIS — E785 Hyperlipidemia, unspecified: Secondary | ICD-10-CM | POA: Diagnosis present

## 2018-05-16 DIAGNOSIS — Z833 Family history of diabetes mellitus: Secondary | ICD-10-CM | POA: Diagnosis not present

## 2018-05-16 DIAGNOSIS — A0472 Enterocolitis due to Clostridium difficile, not specified as recurrent: Secondary | ICD-10-CM | POA: Diagnosis present

## 2018-05-16 DIAGNOSIS — I5023 Acute on chronic systolic (congestive) heart failure: Secondary | ICD-10-CM | POA: Diagnosis present

## 2018-05-16 DIAGNOSIS — R000 Tachycardia, unspecified: Secondary | ICD-10-CM | POA: Diagnosis not present

## 2018-05-16 DIAGNOSIS — R197 Diarrhea, unspecified: Secondary | ICD-10-CM

## 2018-05-16 DIAGNOSIS — I130 Hypertensive heart and chronic kidney disease with heart failure and stage 1 through stage 4 chronic kidney disease, or unspecified chronic kidney disease: Secondary | ICD-10-CM | POA: Diagnosis present

## 2018-05-16 DIAGNOSIS — K529 Noninfective gastroenteritis and colitis, unspecified: Secondary | ICD-10-CM | POA: Diagnosis not present

## 2018-05-16 DIAGNOSIS — E1122 Type 2 diabetes mellitus with diabetic chronic kidney disease: Secondary | ICD-10-CM | POA: Diagnosis present

## 2018-05-16 LAB — URINALYSIS, ROUTINE W REFLEX MICROSCOPIC
BILIRUBIN URINE: NEGATIVE
Glucose, UA: 50 mg/dL — AB
Hgb urine dipstick: NEGATIVE
KETONES UR: 20 mg/dL — AB
Nitrite: NEGATIVE
Protein, ur: 30 mg/dL — AB
Specific Gravity, Urine: 1.042 — ABNORMAL HIGH (ref 1.005–1.030)
pH: 5 (ref 5.0–8.0)

## 2018-05-16 LAB — HEPATIC FUNCTION PANEL
ALT: 27 U/L (ref 14–54)
AST: 30 U/L (ref 15–41)
Albumin: 2.9 g/dL — ABNORMAL LOW (ref 3.5–5.0)
Alkaline Phosphatase: 25 U/L — ABNORMAL LOW (ref 38–126)
Bilirubin, Direct: 0.1 mg/dL (ref 0.1–0.5)
Indirect Bilirubin: 0.7 mg/dL (ref 0.3–0.9)
Total Bilirubin: 0.8 mg/dL (ref 0.3–1.2)
Total Protein: 6.7 g/dL (ref 6.5–8.1)

## 2018-05-16 LAB — GLUCOSE, CAPILLARY: Glucose-Capillary: 158 mg/dL — ABNORMAL HIGH (ref 65–99)

## 2018-05-16 LAB — CBC WITH DIFFERENTIAL/PLATELET
Abs Immature Granulocytes: 0 K/uL (ref 0.0–0.1)
Basophils Absolute: 0 K/uL (ref 0.0–0.1)
Basophils Relative: 0 %
Eosinophils Absolute: 0.2 K/uL (ref 0.0–0.7)
Eosinophils Relative: 2 %
HCT: 36.2 % (ref 36.0–46.0)
Hemoglobin: 11.5 g/dL — ABNORMAL LOW (ref 12.0–15.0)
Immature Granulocytes: 0 %
Lymphocytes Relative: 9 %
Lymphs Abs: 0.9 K/uL (ref 0.7–4.0)
MCH: 28.7 pg (ref 26.0–34.0)
MCHC: 31.8 g/dL (ref 30.0–36.0)
MCV: 90.3 fL (ref 78.0–100.0)
Monocytes Absolute: 0.8 K/uL (ref 0.1–1.0)
Monocytes Relative: 9 %
Neutro Abs: 7.4 K/uL (ref 1.7–7.7)
Neutrophils Relative %: 80 %
Platelets: 179 K/uL (ref 150–400)
RBC: 4.01 MIL/uL (ref 3.87–5.11)
RDW: 15.9 % — ABNORMAL HIGH (ref 11.5–15.5)
WBC: 9.3 K/uL (ref 4.0–10.5)

## 2018-05-16 LAB — TSH: TSH: 1.232 u[IU]/mL (ref 0.350–4.500)

## 2018-05-16 LAB — C DIFFICILE QUICK SCREEN W PCR REFLEX
C DIFFICILE (CDIFF) TOXIN: POSITIVE — AB
C Diff antigen: POSITIVE — AB
C Diff interpretation: DETECTED

## 2018-05-16 LAB — BASIC METABOLIC PANEL
Anion gap: 14 (ref 5–15)
BUN: 9 mg/dL (ref 6–20)
CO2: 23 mmol/L (ref 22–32)
Calcium: 8.1 mg/dL — ABNORMAL LOW (ref 8.9–10.3)
Chloride: 103 mmol/L (ref 101–111)
Creatinine, Ser: 1.08 mg/dL — ABNORMAL HIGH (ref 0.44–1.00)
GFR calc Af Amer: 53 mL/min — ABNORMAL LOW (ref >60)
GFR calc non Af Amer: 46 mL/min — ABNORMAL LOW (ref >60)
Glucose, Bld: 212 mg/dL — ABNORMAL HIGH (ref 65–99)
Potassium: 3.3 mmol/L — ABNORMAL LOW (ref 3.5–5.1)
Sodium: 140 mmol/L (ref 135–145)

## 2018-05-16 LAB — LACTIC ACID, PLASMA
LACTIC ACID, VENOUS: 1.6 mmol/L (ref 0.5–1.9)
Lactic Acid, Venous: 1.8 mmol/L (ref 0.5–1.9)

## 2018-05-16 LAB — TROPONIN I: Troponin I: 0.08 ng/mL

## 2018-05-16 MED ORDER — VANCOMYCIN 50 MG/ML ORAL SOLUTION
125.0000 mg | Freq: Four times a day (QID) | ORAL | Status: DC
  Administered 2018-05-16 – 2018-05-23 (×28): 125 mg via ORAL
  Filled 2018-05-16 (×33): qty 2.5

## 2018-05-16 MED ORDER — ONDANSETRON HCL 4 MG PO TABS: 4.0000 mg | ORAL_TABLET | Freq: Four times a day (QID) | ORAL | Status: DC | PRN

## 2018-05-16 MED ORDER — SODIUM CHLORIDE 0.9 % IV SOLN
INTRAVENOUS | Status: DC
  Administered 2018-05-16 (×2): via INTRAVENOUS

## 2018-05-16 MED ORDER — CIPROFLOXACIN IN D5W 400 MG/200ML IV SOLN
400.0000 mg | Freq: Once | INTRAVENOUS | Status: AC
  Administered 2018-05-16: 400 mg via INTRAVENOUS
  Filled 2018-05-16: qty 200

## 2018-05-16 MED ORDER — METRONIDAZOLE IN NACL 5-0.79 MG/ML-% IV SOLN
500.0000 mg | Freq: Three times a day (TID) | INTRAVENOUS | Status: DC
  Administered 2018-05-16: 500 mg via INTRAVENOUS
  Filled 2018-05-16: qty 100

## 2018-05-16 MED ORDER — METRONIDAZOLE IN NACL 5-0.79 MG/ML-% IV SOLN
500.0000 mg | Freq: Once | INTRAVENOUS | Status: AC
  Administered 2018-05-16: 500 mg via INTRAVENOUS
  Filled 2018-05-16: qty 100

## 2018-05-16 MED ORDER — CIPROFLOXACIN IN D5W 400 MG/200ML IV SOLN: 400.0000 mg | Freq: Two times a day (BID) | INTRAVENOUS | Status: DC

## 2018-05-16 MED ORDER — PANTOPRAZOLE SODIUM 40 MG PO TBEC
40.0000 mg | DELAYED_RELEASE_TABLET | Freq: Every day | ORAL | Status: DC
  Administered 2018-05-16: 40 mg via ORAL
  Filled 2018-05-16: qty 1

## 2018-05-16 MED ORDER — ONDANSETRON HCL 4 MG/2ML IJ SOLN: 4.0000 mg | Freq: Four times a day (QID) | INTRAMUSCULAR | Status: DC | PRN

## 2018-05-16 MED ORDER — ACETAMINOPHEN 325 MG PO TABS
650.0000 mg | ORAL_TABLET | Freq: Four times a day (QID) | ORAL | Status: DC | PRN
  Administered 2018-05-16 – 2018-05-22 (×5): 650 mg via ORAL
  Filled 2018-05-16 (×5): qty 2

## 2018-05-16 MED ORDER — OMEGA-3-ACID ETHYL ESTERS 1 G PO CAPS
1.0000 g | ORAL_CAPSULE | Freq: Every day | ORAL | Status: DC
  Administered 2018-05-16 – 2018-05-23 (×8): 1 g via ORAL
  Filled 2018-05-16 (×8): qty 1

## 2018-05-16 MED ORDER — ONDANSETRON HCL 4 MG/2ML IJ SOLN
4.0000 mg | Freq: Once | INTRAMUSCULAR | Status: AC
  Administered 2018-05-16: 4 mg via INTRAVENOUS
  Filled 2018-05-16: qty 2

## 2018-05-16 MED ORDER — ENOXAPARIN SODIUM 40 MG/0.4ML ~~LOC~~ SOLN
40.0000 mg | Freq: Every day | SUBCUTANEOUS | Status: DC
  Administered 2018-05-16 – 2018-05-23 (×8): 40 mg via SUBCUTANEOUS
  Filled 2018-05-16 (×8): qty 0.4

## 2018-05-16 MED ORDER — ACETAMINOPHEN 650 MG RE SUPP: 650.0000 mg | Freq: Four times a day (QID) | RECTAL | Status: DC | PRN

## 2018-05-16 MED ORDER — EZETIMIBE 10 MG PO TABS
10.0000 mg | ORAL_TABLET | Freq: Every day | ORAL | Status: DC
  Administered 2018-05-16 – 2018-05-23 (×8): 10 mg via ORAL
  Filled 2018-05-16 (×8): qty 1

## 2018-05-16 MED ORDER — ASPIRIN EC 81 MG PO TBEC
81.0000 mg | DELAYED_RELEASE_TABLET | Freq: Every day | ORAL | Status: DC
  Administered 2018-05-16 – 2018-05-23 (×8): 81 mg via ORAL
  Filled 2018-05-16 (×8): qty 1

## 2018-05-16 MED ORDER — IOHEXOL 300 MG/ML  SOLN
100.0000 mL | Freq: Once | INTRAMUSCULAR | Status: AC | PRN
  Administered 2018-05-16: 100 mL via INTRAVENOUS

## 2018-05-16 MED ORDER — LINAGLIPTIN 5 MG PO TABS
5.0000 mg | ORAL_TABLET | Freq: Every day | ORAL | Status: DC
  Administered 2018-05-16 – 2018-05-23 (×8): 5 mg via ORAL
  Filled 2018-05-16 (×8): qty 1

## 2018-05-16 NOTE — ED Notes (Signed)
Pt Son Chelsea Ward [phone]

## 2018-05-16 NOTE — ED Notes (Signed)
Heart Healthy/Carb Modified diet dinner tray ordered. 

## 2018-05-16 NOTE — ED Notes (Signed)
Amion text sent to admitting about positive c-diff result

## 2018-05-16 NOTE — ED Notes (Signed)
Did a complete bed change, due to incontinent episode of feces.

## 2018-05-16 NOTE — H&P (Signed)
History and Physical    Chelsea Ward WUJ:[id number] DOB: 1933-11-28 DOA: 05/15/2018  PCP: Patient, No Pcp Per  Patient coming from: Home.  Chief Complaint: Weakness.  HPI: [name] is a 84 y.o. female with history of diabetes mellitus type 2, hyperlipidemia presents to the ER with complaints of having weakness with some subjective feeling of fever chills and abdominal pain.  Patient had come to the ER on Apr 26, 2018 3 weeks ago with abdominal pain and diarrhea at the time was diagnosed with colitis and was placed on Flagyl and Cipro and discharged home.  Patient states since discharge patient diarrhea stopped in 4 days.  Patient felt better following that.  Last 24 hours patient started developing weakness with abdominal discomfort mostly in the left lower quadrant and subjective feeling of fever chills.  Denies any chest pain or shortness of breath.  ED Course: In the ER patient had 4 episodes of watery diarrhea.  CT abdomen and pelvis done shows chronic colitis.  Patient was febrile tachycardic and was placed on IV fluids but culture sent and started on Cipro and Flagyl.  Review of Systems: As per HPI, rest all negative.   Past Medical History:  Diagnosis Date  . Diabetes mellitus without complication (HCC)   . Heart disease   . Hypercholesteremia   . Hypertension     Past Surgical History:  Procedure Laterality Date  . COLON SURGERY    . EYE SURGERY       reports that she has never smoked. She has never used smokeless tobacco. She reports that she does not drink alcohol. Her drug history is not on file.  No Known Allergies  Family History  Problem Relation Age of Onset  . Diabetes Mellitus II Son     Prior to Admission medications   Medication Sig Start Date End Date Taking? Authorizing Provider  aspirin EC 81 MG tablet Take 81 mg by mouth daily.   Yes [provider]  ezetimibe (ZETIA) 10 MG tablet Take 10 mg by mouth daily. 01/29/18  Yes [provider]  furosemide (LASIX) 20 MG tablet Take 20 mg by mouth daily.   Yes [provider]  JANUVIA 100 MG tablet Take 100 mg by mouth daily. 03/02/18  Yes [provider]  Omega-3 Fatty Acids (FISH OIL) 500 MG CAPS Take 1 capsule by mouth 2 (two) times daily.   Yes [provider]  omeprazole (PRILOSEC) 20 MG capsule Take 20 mg by mouth 2 (two) times daily.   Yes [provider]  azithromycin (ZITHROMAX) 250 MG tablet Take 1 tablet (250 mg total) by mouth daily. Take first 2 tablets together, then 1 every day until finished. Patient not taking: Reported on 04/26/2018 12/23/16   Deatra Canter, FNP  benzonatate (TESSALON) 100 MG capsule Take 1 capsule (100 mg total) by mouth every 8 (eight) hours. Patient not taking: Reported on 04/26/2018 12/23/16   Deatra Canter, FNP  predniSONE (DELTASONE) 10 MG tablet Take 2 tablets (20 mg total) by mouth daily. Patient not taking: Reported on 04/26/2018 12/23/16   Deatra Canter, FNP    Physical Exam: Vitals:   05/16/18 0230 05/16/18 0245 05/16/18 0300 05/16/18 0456  BP:  (!) 148/81 135/82 (!) 144/92  Pulse: (!) 118 (!) 110 89 (!) 102  Resp: (!) 22 20 (!) 22 (!) 24  Temp:      TempSrc:      SpO2: 97% 96% 94% 94%  Constitutional: Moderately built and nourished. Vitals:   05/16/18 0230 05/16/18 0245 05/16/18 0300 05/16/18 0456  BP:  (!) 148/81 135/82 (!) 144/92  Pulse: (!) 118 (!) 110 89 (!) 102  Resp: (!) 22 20 (!) 22 (!) 24  Temp:      TempSrc:      SpO2: 97% 96% 94% 94%   Eyes: Anicteric no pallor. ENMT: No discharge from the ears eyes nose or mouth. Neck: No mass felt.  No JVD appreciated. Respiratory: No rhonchi or crepitations. Cardiovascular: S1-S2 heard no murmurs appreciated.  Abdomen: Mild tenderness in the left lower quadrant.  No guarding or rigidity. Musculoskeletal: No edema.  No joint effusion. Skin: No rash. Neurologic: Alert awake oriented to time place and person.  Moves  all extremities. Psychiatric: Appears normal.  Normal affect.   Labs on Admission: I have personally reviewed following labs and imaging studies  CBC: Recent Labs  Lab 05/15/18 2327 05/15/18 2333  WBC 12.2*  --   NEUTROABS 10.6*  --   HGB 12.5 13.3  HCT 40.9 39.0  MCV 93.2  --   PLT 191  --    Basic Metabolic Panel: Recent Labs  Lab 05/15/18 2327 05/15/18 2333  NA 138 139  K 3.4* 3.3*  CL 103 103  CO2 21*  --   GLUCOSE 176* 187*  BUN 11 11  CREATININE 1.16* 1.00  CALCIUM 8.3*  --    GFR: CrCl cannot be calculated (Unknown ideal weight.). Liver Function Tests: No results for input(s): AST, ALT, ALKPHOS, BILITOT, PROT, ALBUMIN in the last 168 hours. No results for input(s): LIPASE, AMYLASE in the last 168 hours. No results for input(s): AMMONIA in the last 168 hours. Coagulation Profile: No results for input(s): INR, PROTIME in the last 168 hours. Cardiac Enzymes: No results for input(s): CKTOTAL, CKMB, CKMBINDEX, TROPONINI in the last 168 hours. BNP (last 3 results) No results for input(s): PROBNP in the last 8760 hours. HbA1C: No results for input(s): HGBA1C in the last 72 hours. CBG: Recent Labs  Lab 05/15/18 2145  GLUCAP 141*   Lipid Profile: No results for input(s): CHOL, HDL, LDLCALC, TRIG, CHOLHDL, LDLDIRECT in the last 72 hours. Thyroid Function Tests: No results for input(s): TSH, T4TOTAL, FREET4, T3FREE, THYROIDAB in the last 72 hours. Anemia Panel: No results for input(s): VITAMINB12, FOLATE, FERRITIN, TIBC, IRON, RETICCTPCT in the last 72 hours. Urine analysis:    Component Value Date/Time   COLORURINE AMBER (A) 05/16/2018 0311   APPEARANCEUR HAZY (A) 05/16/2018 0311   LABSPEC 1.042 (H) 05/16/2018 0311   PHURINE 5.0 05/16/2018 0311   GLUCOSEU 50 (A) 05/16/2018 0311   HGBUR NEGATIVE 05/16/2018 0311   BILIRUBINUR NEGATIVE 05/16/2018 0311   KETONESUR 20 (A) 05/16/2018 0311   PROTEINUR 30 (A) 05/16/2018 0311   NITRITE NEGATIVE 05/16/2018  0311   LEUKOCYTESUR MODERATE (A) 05/16/2018 0311   Sepsis Labs: @LABRCNTIP (procalcitonin:4,lacticidven:4) )No results found for this or any previous visit (from the past 240 hour(s)).   Radiological Exams on Admission: Dg Chest 2 View  Result Date: 05/15/2018 CLINICAL DATA:  Generalized weakness. EXAM: CHEST - 2 VIEW COMPARISON:  None. FINDINGS: Mild cardiomegaly. The hila and mediastinum are unremarkable. No pulmonary nodules or masses. Mild opacity in left base is favored to be atelectasis. No suspicious infiltrate. No overt edema. IMPRESSION: Mild atelectasis in the left base. Electronically Signed   By: Gerome Samavid  Williams III M.D   On: 05/15/2018 22:34   Ct Abdomen Pelvis W Contrast  Result Date: 05/16/2018  CLINICAL DATA:  Generalized weakness starting at 4-5 p.m. EXAM: CT ABDOMEN AND PELVIS WITH CONTRAST TECHNIQUE: Multidetector CT imaging of the abdomen and pelvis was performed using the standard protocol following bolus administration of intravenous contrast. CONTRAST:  OMNIPAQUE IOHEXOL 300 MG/ML  SOLN COMPARISON:  04/26/2018 FINDINGS: Lower chest: Mild cardiomegaly without pericardial effusion or thickening. Dependent atelectasis at the lung bases. Hepatobiliary: Calcifications in the right hepatic dome compatible with old granulomatous disease. No biliary dilatation or space-occupying mass. Tiny too small to characterize hypodensity in the left hepatic lobe consistent with a cyst or hemangioma. This measures 5 mm. Gallbladder is free of stones and wall thickening. No biliary dilatation is noted. Pancreas: Normal Spleen: Normal Adrenals/Urinary Tract: Normal bilateral adrenal glands. Minimal cortical thinning and scarring of the upper poles of both kidneys. Tiny too small to characterize hypodensities within both kidneys are statistically consistent with cysts. No enhancing renal mass lesions, nephrolithiasis nor obstructive uropathy. Nondistended urinary bladder with partial opacification  of the ureter presumably from excretion of IV contrast. Stomach/Bowel: Decompressed stomach. Mild fluid-filled distention of jejunal loops in the left hemiabdomen may represent a mild enteritis. No mechanical bowel obstruction is seen. Patent small bowel anastomosis at the level of the umbilicus. The colon is decompressed in appearance and there is scattered colonic diverticulosis without acute diverticulitis noted. Once again there is mild diffuse transmural thickening of the sigmoid and rectum with moderate fecal retention compatible with chronic mild proctocolitis. Vascular/Lymphatic: Moderate aortoiliac and branch vessel atherosclerosis without adenopathy. Reproductive: Status post hysterectomy. No adnexal masses. Other: No abdominal wall hernia or abnormality. No abdominopelvic ascites. Musculoskeletal: Thoracolumbar spondylosis and facet arthropathy. No aggressive osseous lesions. IMPRESSION: 1. 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 2. Cardiomegaly without active pulmonary disease. 3. Left-sided colonic diverticulosis without acute diverticulitis. 4. Hepatic granulomata at the dome of the right hepatic lobe. 5. Tiny too small to characterize hepatic and renal cysts as before. Electronically Signed   By: Tollie Eth M.D.   On: 05/16/2018 02:21    EKG: Independently reviewed.  Sinus tachycardia with multiple PVCs.  Assessment/Plan Active Problems:   SIRS (systemic inflammatory response syndrome) (HCC)   Diarrhea   Diabetes mellitus type 2 in nonobese (HCC)   HLD (hyperlipidemia)    1. SIRS with possible developing sepsis likely source could be intra-abdominal colitis seen in the CAT scan with patient having persistent diarrhea -patient was on antibiotics prior to last ER visit for eye surgery.  Will check stool for C. difficile and GI pathogen panel.  Continue with hydration Cipro and Flagyl check lactic acid levels follow blood cultures.  UA is pending. 2. Diabetes mellitus  type 2 on Januvia. 3. Hyperlipidemia on Zetia. 4. Chronic kidney disease stage II.  Follow metabolic panel.   DVT prophylaxis: Lovenox. Code Status: Full code. Family Communication: Patient's son. Disposition Plan: Home. Consults called: None. Admission status: Inpatient.     Eduard Clos MD [facility] Pager [phone].  If 7PM-7AM, please contact night-coverage [email or web address] Password [facility]  05/16/2018, 5:16 AM

## 2018-05-16 NOTE — ED Notes (Addendum)
Pt has loose stool, cleaned pt up and fresh pads under pt.

## 2018-05-16 NOTE — ED Notes (Signed)
Lunch tray ordered 

## 2018-05-16 NOTE — ED Notes (Signed)
Attempted to call report

## 2018-05-16 NOTE — Progress Notes (Signed)
Patient seen and examined, admitted earlier this morning by Dr. Toniann FailKakrakandy  Chelsea Ward is an 84 year old African-American female with history of type 2 diabetes, hypertension, dyslipidemia was seen in the emergency room on 5/23 with abdominal pain and diarrhea, found to have colitis discharged home on oral ciprofloxacin and Flagyl with modest clinical improvement, she completed her antibiotic course a few days ago with recurrence of diarrhea presented to the ER overnight with 5+ episodes of diarrhea, abdominal pain, fever, tachycardia, C. Difficile PCR toxin positive. 1. C. Difficile colitis -Start oral vancomycin -Stop ciprofloxacin and Flagyl -IV fluids, supportive care -DC Protonix  Rest of her chronic medical problems are stable  Chelsea CovePreetha Wretha Laris, MD

## 2018-05-17 DIAGNOSIS — A0472 Enterocolitis due to Clostridium difficile, not specified as recurrent: Secondary | ICD-10-CM | POA: Diagnosis present

## 2018-05-17 DIAGNOSIS — K529 Noninfective gastroenteritis and colitis, unspecified: Secondary | ICD-10-CM

## 2018-05-17 LAB — BASIC METABOLIC PANEL
Anion gap: 12 (ref 5–15)
BUN: 13 mg/dL (ref 6–20)
CHLORIDE: 103 mmol/L (ref 101–111)
CO2: 24 mmol/L (ref 22–32)
CREATININE: 1.11 mg/dL — AB (ref 0.44–1.00)
Calcium: 7.9 mg/dL — ABNORMAL LOW (ref 8.9–10.3)
GFR calc Af Amer: 52 mL/min — ABNORMAL LOW (ref >60)
GFR calc non Af Amer: 45 mL/min — ABNORMAL LOW (ref >60)
Glucose, Bld: 122 mg/dL — ABNORMAL HIGH (ref 65–99)
Potassium: 2.8 mmol/L — ABNORMAL LOW (ref 3.5–5.1)
Sodium: 139 mmol/L (ref 135–145)

## 2018-05-17 LAB — GLUCOSE, CAPILLARY
GLUCOSE-CAPILLARY: 130 mg/dL — AB (ref 65–99)
GLUCOSE-CAPILLARY: 120 mg/dL — AB (ref 65–99)
Glucose-Capillary: 112 mg/dL — ABNORMAL HIGH (ref 65–99)
Glucose-Capillary: 161 mg/dL — ABNORMAL HIGH (ref 65–99)

## 2018-05-17 MED ORDER — POTASSIUM CHLORIDE CRYS ER 20 MEQ PO TBCR
40.0000 meq | EXTENDED_RELEASE_TABLET | ORAL | Status: AC
  Administered 2018-05-17 (×3): 40 meq via ORAL
  Filled 2018-05-17 (×3): qty 2

## 2018-05-17 MED ORDER — POLYVINYL ALCOHOL 1.4 % OP SOLN
1.0000 [drp] | Freq: Two times a day (BID) | OPHTHALMIC | Status: DC
  Administered 2018-05-18 – 2018-05-23 (×12): 1 [drp] via OPHTHALMIC
  Filled 2018-05-17: qty 15

## 2018-05-17 NOTE — Progress Notes (Signed)
Patient still having multiple loose bowel movements.

## 2018-05-17 NOTE — Plan of Care (Signed)
  Problem: Education: Goal: Knowledge of General Education information will improve Outcome: Progressing   Problem: Health Behavior/Discharge Planning: Goal: Ability to manage health-related needs will improve Outcome: Progressing   Problem: Activity: Goal: Risk for activity intolerance will decrease Outcome: Progressing   Problem: Nutrition: Goal: Adequate nutrition will be maintained Outcome: Progressing   Problem: Elimination: Goal: Will not experience complications related to bowel motility Outcome: Progressing   Problem: Safety: Goal: Ability to remain free from injury will improve Outcome: Progressing   Problem: Skin Integrity: Goal: Risk for impaired skin integrity will decrease Outcome: Progressing

## 2018-05-17 NOTE — Progress Notes (Signed)
PROGRESS NOTE    Chelsea Ward  OZH:[id number] DOB: 1933-01-19 DOA: 05/15/2018 PCP: Patient, No Pcp Per  Brief Narrative:Chelsea Ward is an 84 year old African-American female with history of type 2 diabetes, hypertension, dyslipidemia was seen in the emergency room on 5/23 with abdominal pain and diarrhea, found to have colitis discharged home on oral ciprofloxacin and Flagyl with modest clinical improvement, she completed her antibiotic course a few days ago with recurrence of diarrhea presented to the ER  with 5+ episodes of diarrhea, abdominal pain, fever, tachycardia, C. Difficile PCR toxin positive.  Assessment & Plan:   C. Difficile colitis -continue oral vancomycin-day 2 -continues to have diarrhea today -Stopped ciprofloxacin and Flagyl -supportive care -stopped Protonix  Diabetes mellitus type 2  -continuing at Linagliptin, CBG stable  Hyperlipidemia -Continue Zetia.  Chronic kidney disease stage II -stable, creatinine at baseline  DVT prophylaxis:Lovenox Code Status: full code Family Communication:per family at bedside Disposition Plan:home in 24-48 hours   Consultants:      Procedures:   Antimicrobials:    Subjective: -continues to have diarrhea, starting to feel little better today  Objective: Vitals:   05/16/18 2127 05/16/18 2129 05/17/18 0538 05/17/18 1145  BP:  140/65 116/60 (!) 114/59  Pulse:  81 98 79  Resp:  18 18   Temp:  99.8 F (37.7 C) 98.2 F (36.8 C) (!) 97.5 F (36.4 C)  TempSrc:  Oral Oral Oral  SpO2:  95% 96% 98%  Weight: 67.8 kg (149 lb 8 oz)  67.8 kg (149 lb 8 oz)   Height: 5\' 4"  (1.626 m)       Intake/Output Summary (Last 24 hours) at 05/17/2018 1358 Last data filed at 05/17/2018 0600 Gross per 24 hour  Intake 2560 ml  Output 2 ml  Net 2558 ml   Filed Weights   05/16/18 2127 05/17/18 0538  Weight: 67.8 kg (149 lb 8 oz) 67.8 kg (149 lb 8 oz)    Examination:  General exam: Appears calm and comfortable  Respiratory  system: Clear to auscultation. Respiratory effort normal. Cardiovascular system: S1 & S2 heard, RRR. No JVD, murmurs Gastrointestinal system: Abdomen is nondistended, soft and  Less tender.Normal bowel sounds heard. Central nervous system: Alert and oriented. No focal neurological deficits. Extremities: Symmetric 5 x 5 power. Skin: No rashes, lesions or ulcers Psychiatry: Judgement and insight appear normal. Mood & affect appropriate.     Data Reviewed:   CBC: Recent Labs  Lab 05/15/18 2327 05/15/18 2333 05/16/18 0543  WBC 12.2*  --  9.3  NEUTROABS 10.6*  --  7.4  HGB 12.5 13.3 11.5*  HCT 40.9 39.0 36.2  MCV 93.2  --  90.3  PLT 191  --  179   Basic Metabolic Panel: Recent Labs  Lab 05/15/18 2327 05/15/18 2333 05/16/18 0543 05/17/18 0623  NA 138 139 140 139  K 3.4* 3.3* 3.3* 2.8*  CL 103 103 103 103  CO2 21*  --  23 24  GLUCOSE 176* 187* 212* 122*  BUN 11 11 9 13   CREATININE 1.16* 1.00 1.08* 1.11*  CALCIUM 8.3*  --  8.1* 7.9*   GFR: Estimated Creatinine Clearance: 36.3 mL/min (A) (by C-G formula based on SCr of 1.11 mg/dL (H)). Liver Function Tests: Recent Labs  Lab 05/16/18 0543  AST 30  ALT 27  ALKPHOS 25*  BILITOT 0.8  PROT 6.7  ALBUMIN 2.9*   No results for input(s): LIPASE, AMYLASE in the last 168 hours. No results for input(s): AMMONIA in the last  168 hours. Coagulation Profile: No results for input(s): INR, PROTIME in the last 168 hours. Cardiac Enzymes: Recent Labs  Lab 05/16/18 0543  TROPONINI 0.08*   BNP (last 3 results) No results for input(s): PROBNP in the last 8760 hours. HbA1C: No results for input(s): HGBA1C in the last 72 hours. CBG: Recent Labs  Lab 05/15/18 2145 05/16/18 2144 05/17/18 0744 05/17/18 1143  GLUCAP 141* 158* 112* 161*   Lipid Profile: No results for input(s): CHOL, HDL, LDLCALC, TRIG, CHOLHDL, LDLDIRECT in the last 72 hours. Thyroid Function Tests: Recent Labs    05/16/18 0544  TSH 1.232   Anemia  Panel: No results for input(s): VITAMINB12, FOLATE, FERRITIN, TIBC, IRON, RETICCTPCT in the last 72 hours. Urine analysis:    Component Value Date/Time   COLORURINE AMBER (A) 05/16/2018 0311   APPEARANCEUR HAZY (A) 05/16/2018 0311   LABSPEC 1.042 (H) 05/16/2018 0311   PHURINE 5.0 05/16/2018 0311   GLUCOSEU 50 (A) 05/16/2018 0311   HGBUR NEGATIVE 05/16/2018 0311   BILIRUBINUR NEGATIVE 05/16/2018 0311   KETONESUR 20 (A) 05/16/2018 0311   PROTEINUR 30 (A) 05/16/2018 0311   NITRITE NEGATIVE 05/16/2018 0311   LEUKOCYTESUR MODERATE (A) 05/16/2018 0311   Sepsis Labs: @LABRCNTIP (procalcitonin:4,lacticidven:4)  ) Recent Results (from the past 240 hour(s))  Culture, blood (routine x 2)     Status: None (Preliminary result)   Collection Time: 05/15/18 11:37 PM  Result Value Ref Range Status   Specimen Description BLOOD RIGHT HAND  Final   Special Requests   Final    BOTTLES DRAWN AEROBIC AND ANAEROBIC Blood Culture adequate volume   Culture   Final    NO GROWTH 1 DAY Performed at [facility], [address] [place], [place] [address]    Report Status PENDING  Incomplete  Culture, blood (routine x 2)     Status: None (Preliminary result)   Collection Time: 05/16/18  3:54 AM  Result Value Ref Range Status   Specimen Description BLOOD RIGHT ARM  Final   Special Requests   Final    BOTTLES DRAWN AEROBIC ONLY Blood Culture adequate volume   Culture   Final    NO GROWTH 1 DAY Performed at [facility], [address] [place], [place] [address]    Report Status PENDING  Incomplete  C difficile quick scan w PCR reflex     Status: Abnormal   Collection Time: 05/16/18  9:37 AM  Result Value Ref Range Status   C Diff antigen POSITIVE (A) NEGATIVE Final   C Diff toxin POSITIVE (A) NEGATIVE Final   C Diff interpretation Toxin producing C. difficile detected.  Final    Comment: CRITICAL RESULT CALLED TO, READ BACK BY AND VERIFIED WITH: Micah FlesherK. Pelkey RN 10:45 05/16/18  ([email or web address]) Performed at [facility], [address] [place], [place] [address]          Radiology Studies: Dg Chest 2 View  Result Date: 05/15/2018 CLINICAL DATA:  Generalized weakness. EXAM: CHEST - 2 VIEW COMPARISON:  None. FINDINGS: Mild cardiomegaly. The hila and mediastinum are unremarkable. No pulmonary nodules or masses. Mild opacity in left base is favored to be atelectasis. No suspicious infiltrate. No overt edema. IMPRESSION: Mild atelectasis in the left base. Electronically Signed   By: Gerome Samavid  Williams III M.D   On: 05/15/2018 22:34   Ct Abdomen Pelvis W Contrast  Result Date: 05/16/2018 CLINICAL DATA:  Generalized weakness starting at 4-5 p.m. EXAM: CT ABDOMEN AND PELVIS WITH CONTRAST  TECHNIQUE: Multidetector CT imaging of the abdomen and pelvis was performed using the standard protocol following bolus administration of intravenous contrast. CONTRAST:  OMNIPAQUE IOHEXOL 300 MG/ML  SOLN COMPARISON:  04/26/2018 FINDINGS: Lower chest: Mild cardiomegaly without pericardial effusion or thickening. Dependent atelectasis at the lung bases. Hepatobiliary: Calcifications in the right hepatic dome compatible with old granulomatous disease. No biliary dilatation or space-occupying mass. Tiny too small to characterize hypodensity in the left hepatic lobe consistent with a cyst or hemangioma. This measures 5 mm. Gallbladder is free of stones and wall thickening. No biliary dilatation is noted. Pancreas: Normal Spleen: Normal Adrenals/Urinary Tract: Normal bilateral adrenal glands. Minimal cortical thinning and scarring of the upper poles of both kidneys. Tiny too small to characterize hypodensities within both kidneys are statistically consistent with cysts. No enhancing renal mass lesions, nephrolithiasis nor obstructive uropathy. Nondistended urinary bladder with partial opacification of the ureter presumably from excretion of IV contrast. Stomach/Bowel: Decompressed stomach. Mild  fluid-filled distention of jejunal loops in the left hemiabdomen may represent a mild enteritis. No mechanical bowel obstruction is seen. Patent small bowel anastomosis at the level of the umbilicus. The colon is decompressed in appearance and there is scattered colonic diverticulosis without acute diverticulitis noted. Once again there is mild diffuse transmural thickening of the sigmoid and rectum with moderate fecal retention compatible with chronic mild proctocolitis. Vascular/Lymphatic: Moderate aortoiliac and branch vessel atherosclerosis without adenopathy. Reproductive: Status post hysterectomy. No adnexal masses. Other: No abdominal wall hernia or abnormality. No abdominopelvic ascites. Musculoskeletal: Thoracolumbar spondylosis and facet arthropathy. No aggressive osseous lesions. IMPRESSION: 1. 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 2. Cardiomegaly without active pulmonary disease. 3. Left-sided colonic diverticulosis without acute diverticulitis. 4. Hepatic granulomata at the dome of the right hepatic lobe. 5. Tiny too small to characterize hepatic and renal cysts as before. Electronically Signed   By: Tollie Eth M.D.   On: 05/16/2018 02:21        Scheduled Meds: . aspirin EC  81 mg Oral Daily  . enoxaparin (LOVENOX) injection  40 mg Subcutaneous Daily  . ezetimibe  10 mg Oral Daily  . linagliptin  5 mg Oral Daily  . omega-3 acid ethyl esters  1 g Oral Daily  . vancomycin  125 mg Oral QID   Continuous Infusions:   LOS: 1 day    Time spent:    Zannie Cove, MD [facility] Page via [email or web address], password [email or web address] After 7PM please contact night-coverage  05/17/2018, 1:58 PM

## 2018-05-17 NOTE — Plan of Care (Signed)
  Problem: Health Behavior/Discharge Planning: Goal: Ability to manage health-related needs will improve Outcome: Progressing   Problem: Clinical Measurements: Goal: Ability to maintain clinical measurements within normal limits will improve Outcome: Progressing   Problem: Elimination: Goal: Will not experience complications related to bowel motility Outcome: Progressing   Problem: Pain Managment: Goal: General experience of comfort will improve Outcome: Progressing   Problem: Skin Integrity: Goal: Risk for impaired skin integrity will decrease Outcome: Progressing

## 2018-05-17 NOTE — Care Management Note (Signed)
Case Management Note  Patient Details  Name: Lorina RabonOdessa Calandro MRN: [id number] Date of Birth: June 17, 1933  Subjective/Objective:      SIRS             Action/Plan: Patient lives at home; has private insurance with [facility] [employer] with prescription drug coverage; CM following for progression of care.  Expected Discharge Date:  05/16/18               Expected Discharge Plan:  Home/Self Care  Discharge planning Services  CM Consult  Status of Service:  In process, will continue to follow  Reola MosherChandler, Trana Ressler L, RN,MHA,BSN [phone] 05/17/2018, 3:38 PM

## 2018-05-18 LAB — GASTROINTESTINAL PANEL BY PCR, STOOL (REPLACES STOOL CULTURE)
Adenovirus F40/41: NOT DETECTED
Astrovirus: NOT DETECTED
CYCLOSPORA CAYETANENSIS: NOT DETECTED
Campylobacter species: NOT DETECTED
Cryptosporidium: NOT DETECTED
Entamoeba histolytica: NOT DETECTED
Enteroaggregative E coli (EAEC): DETECTED — AB
Enteropathogenic E coli (EPEC): NOT DETECTED
Enterotoxigenic E coli (ETEC): NOT DETECTED
Giardia lamblia: NOT DETECTED
Norovirus GI/GII: NOT DETECTED
Plesimonas shigelloides: NOT DETECTED
Rotavirus A: NOT DETECTED
SALMONELLA SPECIES: NOT DETECTED
SAPOVIRUS (I, II, IV, AND V): NOT DETECTED
SHIGA LIKE TOXIN PRODUCING E COLI (STEC): NOT DETECTED
SHIGELLA/ENTEROINVASIVE E COLI (EIEC): NOT DETECTED
VIBRIO SPECIES: NOT DETECTED
Vibrio cholerae: NOT DETECTED
YERSINIA ENTEROCOLITICA: NOT DETECTED

## 2018-05-18 LAB — GLUCOSE, CAPILLARY
GLUCOSE-CAPILLARY: 100 mg/dL — AB (ref 65–99)
Glucose-Capillary: 130 mg/dL — ABNORMAL HIGH (ref 65–99)
Glucose-Capillary: 119 mg/dL — ABNORMAL HIGH (ref 65–99)
Glucose-Capillary: 125 mg/dL — ABNORMAL HIGH (ref 65–99)

## 2018-05-18 LAB — BASIC METABOLIC PANEL
Anion gap: 8 (ref 5–15)
BUN: 11 mg/dL (ref 6–20)
CHLORIDE: 108 mmol/L (ref 101–111)
CO2: 24 mmol/L (ref 22–32)
Calcium: 8 mg/dL — ABNORMAL LOW (ref 8.9–10.3)
Creatinine, Ser: 0.98 mg/dL (ref 0.44–1.00)
GFR calc Af Amer: >60 mL/min (ref >60)
GFR calc non Af Amer: 52 mL/min — ABNORMAL LOW (ref >60)
Glucose, Bld: 89 mg/dL (ref 65–99)
Potassium: 4.3 mmol/L (ref 3.5–5.1)
SODIUM: 140 mmol/L (ref 135–145)

## 2018-05-18 MED ORDER — FUROSEMIDE 10 MG/ML IJ SOLN
20.0000 mg | Freq: Once | INTRAMUSCULAR | Status: AC
  Administered 2018-05-18: 20 mg via INTRAVENOUS
  Filled 2018-05-18: qty 2

## 2018-05-18 MED ORDER — POTASSIUM CHLORIDE CRYS ER 20 MEQ PO TBCR
40.0000 meq | EXTENDED_RELEASE_TABLET | Freq: Two times a day (BID) | ORAL | Status: AC
  Administered 2018-05-18 (×2): 40 meq via ORAL
  Filled 2018-05-18 (×2): qty 2

## 2018-05-18 NOTE — Progress Notes (Signed)
Occupational Therapy Note  Full OT eval completed, full eval to follow.  Recommend 24 hour supervision and HHOT at discharge.  Jeani HawkingWendi Demitra Danley, OTR/L [phone]

## 2018-05-18 NOTE — Progress Notes (Signed)
Pt continues to have discomfort of GI symptoms but is mobile enough to potentially go home.  However if her family cannot care for her may need SNF stay.  Her plan is to continue on with acute therapy for strength and gait, continue short walks as at home and progress to hopefully return there with HHPT to see for same.        05/17/18 2200  PT Visit Information  Last PT Received On 05/17/18  Assistance Needed +1  History of Present Illness 84 yo female was admitted after having SIRS with sepsis, Flat troponin, atelectasis and pericardial effusion.  Has (+) c-diff tox screen, and will be sent to SNF if family cannot care for her.  PMHx:  DM, HTN, CKD 2  Subjective Data  Patient Stated Goal none stated  Precautions  Precautions Fall (teleemtry)  Restrictions  Weight Bearing Restrictions No  Pain Assessment  Pain Assessment No/denies pain  Cognition  Arousal/Alertness Awake/alert  Behavior During Therapy WFL for tasks assessed/performed  Overall Cognitive Status Within Functional Limits for tasks assessed  General Comments pt is familiar with most history and son is there for any details  Bed Mobility  Overal bed mobility Needs Assistance  Bed Mobility Supine to Sit;Sit to Supine  Supine to sit Min assist  Sit to supine Mod assist  Transfers  Overall transfer level Modified independent  Equipment used Rolling walker (2 wheeled);1 person hand held assist  General transfer comment reminders to use bed to push up  Ambulation/Gait  Ambulation/Gait assistance Min assist  Gait Distance (Feet) 30 Feet  Assistive device Rolling walker (2 wheeled)  Gait Pattern/deviations Step-through pattern;Decreased stride length;Wide base of support;Trunk flexed  Gait velocity reduced  Gait velocity interpretation <1.8 ft/sec, indicate of risk for recurrent falls  Balance  Overall balance assessment Needs assistance  Sitting-balance support Feet supported  Sitting balance-Leahy Scale Fair   Standing balance support Bilateral upper extremity supported;During functional activity  Standing balance-Leahy Scale Poor  Exercises  Exercises Other exercises (Pt had 4- to 4 LE strength)  PT - End of Session  Equipment Utilized During Treatment Gait belt  Activity Tolerance Patient tolerated treatment well;Patient limited by fatigue  Patient left in bed;with call bell/phone within reach;with bed alarm set  Nurse Communication Mobility status;Other (comment) (GI issues)   PT - Assessment/Plan  PT Visit Diagnosis Unsteadiness on feet (R26.81);Other abnormalities of gait and mobility (R26.89);Muscle weakness (generalized) (M62.81);Ataxic gait (R26.0);Difficulty in walking, not elsewhere classified (R26.2);Adult, failure to thrive (R62.7)  PT Frequency (ACUTE ONLY) Min 3X/week  Follow Up Recommendations Home health PT (providing family can care for her)  PT equipment Rolling walker with 5" wheels  AM-PAC PT "6 Clicks" Daily Activity Outcome Measure  Difficulty turning over in bed (including adjusting bedclothes, sheets and blankets)? 1  Difficulty moving from lying on back to sitting on the side of the bed?  1  Difficulty sitting down on and standing up from a chair with arms (e.g., wheelchair, bedside commode, etc,.)? 1  Help needed moving to and from a bed to chair (including a wheelchair)? 3  Help needed walking in hospital room? 3  Help needed climbing 3-5 steps with a railing?  2  6 Click Score 11  Mobility G Code  CL  Acute Rehab PT Goals  PT Goal Formulation With patient  Time For Goal Achievement 05/31/18  Potential to Achieve Goals Good  PT Time Calculation  PT Start Time (ACUTE ONLY) 1425  PT Stop Time (ACUTE ONLY)  1458  PT Time Calculation (min) (ACUTE ONLY) 33 min  PT G-Codes **NOT FOR INPATIENT CLASS**  Functional Assessment Tool Used AM-PAC 6 Clicks Basic Mobility  PT General Charges  $$ ACUTE PT VISIT 1 Visit  PT Evaluation  $PT Eval Moderate Complexity 1 Mod   PT Treatments  $Gait Training 8-22 mins    Chelsea Ward, PT MS Acute Rehab Dept. Number: [facility] [id number] and [facility] [phone]

## 2018-05-18 NOTE — Progress Notes (Signed)
11:300am- Patient's HR 150's non sustained while working with OT.

## 2018-05-18 NOTE — Progress Notes (Signed)
Physical Therapy Treatment Patient Details Name: Chelsea RabonOdessa Ward MRN: [id number] DOB: 1933/10/14 Today's Date: 05/18/2018    History of Present Illness 84 yo female was admitted after having SIRS with sepsis, Flat troponin, atelectasis and pericardial effusion.  Has (+) c-diff tox screen, and will be sent to SNF if family cannot care for her.  PMHx:  DM, HTN, CKD 2    PT Comments    Pt was seen for ther exercises and declined gait due to fatigue from having just returned to bed.  Her plan is to continue mobility and progress gait and balance skills as able to decrease fall risk.  Her hope is to return home with son, and will continue to require HHPT intervention.  Will plan to focus on these goals as planned.  Follow Up Recommendations  Home health PT     Equipment Recommendations  Rolling walker with 5" wheels    Recommendations for Other Services       Precautions / Restrictions Precautions Precautions: Fall Restrictions Weight Bearing Restrictions: No    Mobility  Bed Mobility                  Transfers                    Ambulation/Gait             General Gait Details: declined   Stairs             Wheelchair Mobility    Modified Rankin (Stroke Patients Only)       Balance Overall balance assessment: Needs assistance Sitting-balance support: Feet supported Sitting balance-Leahy Scale: Fair                                      Cognition Arousal/Alertness: Awake/alert Behavior During Therapy: WFL for tasks assessed/performed Overall Cognitive Status: No family/caregiver present to determine baseline cognitive functioning                                        Exercises General Exercises - Lower Extremity Ankle Circles/Pumps: AAROM;Both;5 reps Quad Sets: AROM;Both;15 reps Gluteal Sets: AROM;Both;15 reps Heel Slides: AROM;Left;AAROM;15 reps Hip ABduction/ADduction: AROM;Both;20 reps Straight  Leg Raises: AAROM;Both;10 reps    General Comments        Pertinent Vitals/Pain Pain Assessment: Faces Faces Pain Scale: Hurts little more Pain Location: abdomen Pain Descriptors / Indicators: Discomfort;Cramping Pain Intervention(s): Limited activity within patient's tolerance;Monitored during session;Premedicated before session;Repositioned;Patient requesting pain meds-RN notified;Relaxation    Home Living                      Prior Function            PT Goals (current goals can now be found in the care plan section) Acute Rehab PT Goals Patient Stated Goal: to go home with son  PT Goal Formulation: With patient    Frequency    Min 3X/week      PT Plan Current plan remains appropriate    Co-evaluation              AM-PAC PT "6 Clicks" Daily Activity  Outcome Measure  Difficulty turning over in bed (including adjusting bedclothes, sheets and blankets)?: Unable Difficulty moving from lying on back to sitting on the side of  the bed? : Unable Difficulty sitting down on and standing up from a chair with arms (e.g., wheelchair, bedside commode, etc,.)?: Unable Help needed moving to and from a bed to chair (including a wheelchair)?: A Little Help needed walking in hospital room?: A Little Help needed climbing 3-5 steps with a railing? : A Little 6 Click Score: 12    End of Session   Activity Tolerance: Patient tolerated treatment well Patient left: in bed;with call bell/phone within reach;with bed alarm set Nurse Communication: Mobility status PT Visit Diagnosis: Unsteadiness on feet (R26.81);Other abnormalities of gait and mobility (R26.89);Muscle weakness (generalized) (M62.81);Ataxic gait (R26.0);Difficulty in walking, not elsewhere classified (R26.2);Adult, failure to thrive (R62.7)     Time: 1610-9604 PT Time Calculation (min) (ACUTE ONLY): 16 min  Charges:  $Therapeutic Exercise: 8-22 mins                    G Codes:  Functional Assessment  Tool Used: AM-PAC 6 Clicks Basic Mobility     Ivar Drape 05/18/2018, 10:28 PM   Samul Dada, PT MS Acute Rehab Dept. Number: [facility] [id number] and [facility] [phone]

## 2018-05-18 NOTE — Evaluation (Signed)
Occupational Therapy Evaluation Patient Details Name: Chelsea Ward MRN: [id number] DOB: 1933/01/01 Today's Date: 05/18/2018    History of Present Illness 84 yo female was admitted after having SIRS with sepsis, Flat troponin, atelectasis and pericardial effusion.  Has (+) c-diff tox screen, and will be sent to SNF if family cannot care for her.  PMHx:  DM, HTN, CKD 2   Clinical Impression   Pt admitted with above. She demonstrates the below listed deficits and will benefit from continued OT to maximize safety and independence with BADLs.  Pt presents to OT with generalized weakness, decreased activity tolerance, impaired balance. She is slow to process info and to initiate activity.  She currently requires min guard - min A for ADLs.  She reports she lives with son, who is able to provide necessary level of assist at discharge.  Pt with dizziness with sit to stand - see below re; BP readings.   RN alerted OT to increased HR.  Recommend 24 hour assist at discharge.        Follow Up Recommendations  Home health OT;Supervision/Assistance - 24 hour    Equipment Recommendations  None recommended by OT    Recommendations for Other Services       Precautions / Restrictions Precautions Precautions: Fall      Mobility Bed Mobility Overal bed mobility: Needs Assistance Bed Mobility: Supine to Sit     Supine to sit: Min guard;HOB elevated     General bed mobility comments: requires increased time and cues to progress   Transfers Overall transfer level: Needs assistance Equipment used: Rolling walker (2 wheeled) Transfers: Sit to/from [employer] Sit to Stand: Min guard Stand pivot transfers: Min guard       General transfer comment: pt with posterior bias when moving sit to stand - leans back on heels, but is able to self correct when given opportunity     Balance Overall balance assessment: Needs assistance Sitting-balance support: Feet supported Sitting  balance-Leahy Scale: Fair     Standing balance support: Single extremity supported;During functional activity Standing balance-Leahy Scale: Poor Standing balance comment: reliant on UE support                            ADL either performed or assessed with clinical judgement   ADL Overall ADL's : Needs assistance/impaired Eating/Feeding: Independent   Grooming: Wash/dry hands;Oral care;Min guard;Standing   Upper Body Bathing: Set up;Sitting   Lower Body Bathing: Minimal assistance;Sit to/from stand Lower Body Bathing Details (indicate cue type and reason): difficulty accessing feet  Upper Body Dressing : Set up;Sitting   Lower Body Dressing: Moderate assistance;Sit to/from stand Lower Body Dressing Details (indicate cue type and reason): unable to don Rt sock due to abdominal cramping  Toilet Transfer: Min guard;Ambulation;Comfort height toilet;Grab bars;RW   Toileting- Clothing Manipulation and Hygiene: Minimal assistance;Sit to/from stand Toileting - Clothing Manipulation Details (indicate cue type and reason): assist for thoroughness      Functional mobility during ADLs: Min guard;Rolling walker       Vision         Perception     Praxis      Pertinent Vitals/Pain Pain Assessment: Faces Faces Pain Scale: Hurts little more Pain Location: abdomen  Pain Descriptors / Indicators: Cramping;Grimacing;Guarding Pain Intervention(s): Monitored during session;Repositioned;Limited activity within patient's tolerance     Hand Dominance Right   Extremity/Trunk Assessment Upper Extremity Assessment Upper Extremity Assessment: Overall WFL for tasks  assessed   Lower Extremity Assessment Lower Extremity Assessment: Defer to PT evaluation   Cervical / Trunk Assessment Cervical / Trunk Assessment: Normal   Communication Communication Communication: HOH   Cognition Arousal/Alertness: Awake/alert Behavior During Therapy: WFL for tasks  assessed/performed Overall Cognitive Status: No family/caregiver present to determine baseline cognitive functioning                                 General Comments: Pt is slow to respond and to engage  Unsure of her baseline, and if this may be due to hearing loss    General Comments  HR to 143 with activity.  Pt with complaint of dizziness when standing. BP seated 119/73; standing 107/75.  RN aware     Exercises     Shoulder Instructions      Home Living Family/patient expects to be discharged to:: Private residence Living Arrangements: Children Available Help at Discharge: Family Type of Home: House Home Access: Stairs to enter Secretary/administrator of Steps: 6 Entrance Stairs-Rails: Right;Left Home Layout: One level     Bathroom Shower/Tub: Chief Strategy Officer: Standard     Home Equipment: Environmental consultant - 4 wheels;Cane - single point;Grab bars - tub/shower;Grab bars - toilet   Additional Comments: Pt lives with son.  Per pt report, son is with her most of the day      Prior Functioning/Environment Level of Independence: Independent with assistive device(s);Needs assistance        Comments: Pt reports she ambulates with a SPC.  Son assists with transfers into tub and occasionally with socks/stockings.  She reports she goes to church on sundays and bible study on [address].         OT Problem List: Decreased strength;Decreased activity tolerance;Impaired balance (sitting and/or standing);Decreased knowledge of use of DME or AE      OT Treatment/Interventions: Self-care/ADL training;Therapeutic exercise;DME and/or AE instruction;Therapeutic activities;Balance training;Patient/family education    OT Goals(Current goals can be found in the care plan section) Acute Rehab OT Goals Patient Stated Goal: to go home with son  OT Goal Formulation: With patient Time For Goal Achievement: 06/01/18 Potential to Achieve Goals: Good ADL Goals Pt Will  Perform Grooming: with supervision;standing Pt Will Perform Upper Body Bathing: with set-up;with supervision;sitting Pt Will Perform Lower Body Bathing: with supervision;sit to/from stand Pt Will Perform Upper Body Dressing: with supervision;standing Pt Will Perform Lower Body Dressing: with supervision;sit to/from stand Pt Will Transfer to Toilet: with supervision;ambulating;bedside commode;grab bars Pt Will Perform Toileting - Clothing Manipulation and hygiene: sit to/from stand Pt Will Perform Tub/Shower Transfer: with min guard assist;ambulating;shower seat;rolling walker;grab bars  OT Frequency: Min 2X/week   Barriers to D/C:            Co-evaluation              AM-PAC PT "6 Clicks" Daily Activity     Outcome Measure Help from another person eating meals?: None Help from another person taking care of personal grooming?: A Little Help from another person toileting, which includes using toliet, bedpan, or urinal?: A Little Help from another person bathing (including washing, rinsing, drying)?: A Little Help from another person to put on and taking off regular upper body clothing?: A Little Help from another person to put on and taking off regular lower body clothing?: A Little 6 Click Score: 19   End of Session Equipment Utilized During Treatment: Rolling walker  Activity Tolerance: Patient tolerated treatment well Patient left: in chair;with call bell/phone within reach  OT Visit Diagnosis: Unsteadiness on feet (R26.81)                Time: [id number] OT Time Calculation (min): 37 min Charges:  OT General Charges $OT Visit: 1 Visit OT Evaluation $OT Eval Moderate Complexity: 1 Mod OT Treatments $Self Care/Home Management : 8-22 mins G-Codes:     Reynolds AmericanWendi Joleen Stuckert, OTR/L [phone]   Jeani HawkingConarpe, Daleiza Bacchi M 05/18/2018, 4:09 PM

## 2018-05-18 NOTE — Progress Notes (Signed)
PROGRESS NOTE    Rhiannon Sassaman  ZOX:[id number] DOB: Jan 22, 1933 DOA: 05/15/2018 PCP: Patient, No Pcp Per  Brief Narrative:Ms. Kindel is an 84 year old African-American female with history of type 2 diabetes, hypertension, dyslipidemia was seen in the emergency room on 5/23 with abdominal pain and diarrhea, found to have colitis discharged home on oral ciprofloxacin and Flagyl with modest clinical improvement, she completed her antibiotic course a few days ago with recurrence of diarrhea presented to the ER  with 5+ episodes of diarrhea, abdominal pain, fever, tachycardia, C. Difficile PCR toxin positive.  Assessment & Plan:   C. Difficile colitis -continue oral vancomycin-day 3 -continues to have some diarrhea however severity is improving -with Cipro and Flagyl were discontinued on admission, GI pathogen panel positive for anteroapical irrigated Escherichia coli- clinical significance of this is unclear in the current setting, called and d/w ID Dr.Comer -likley false positive will not Rx this -supportive care -stopped Protonix  Diabetes mellitus type 2  -continuing at Linagliptin, CBG stable  Hyperlipidemia -Continue Zetia.  Chronic kidney disease stage II -stable, creatinine at baseline  Volume overloaded -likely iatrogenic from IV fluid administration due to diarrhea and dehydration initially -Lasix 1 today with potassium -Monitor clinical response  DVT prophylaxis:Lovenox Code Status: full code Family Communication:per family at bedside Disposition Plan:home in 24-48 hours   Consultants:      Procedures:   Antimicrobials:    Subjective: -continues to have diarrhea, starting to feel little better today  Objective: Vitals:   05/18/18 0525 05/18/18 0621 05/18/18 0830 05/18/18 1124  BP: 137/77  138/86 103/63  Pulse: 72  72 83  Resp: 20  14   Temp: 98.3 F (36.8 C)   (!) 97.5 F (36.4 C)  TempSrc: Oral   Oral  SpO2: 99%   100%  Weight:  65.9 kg (145 lb 4.5  oz)    Height:        Intake/Output Summary (Last 24 hours) at 05/18/2018 1340 Last data filed at 05/18/2018 1224 Gross per 24 hour  Intake 560 ml  Output 1027 ml  Net -467 ml   Filed Weights   05/16/18 2127 05/17/18 0538 05/18/18 0621  Weight: 67.8 kg (149 lb 8 oz) 67.8 kg (149 lb 8 oz) 65.9 kg (145 lb 4.5 oz)    Examination:  General exam: Appears calm and comfortable  Respiratory system: Clear to auscultation. Respiratory effort normal. Cardiovascular system: S1 & S2 heard, RRR. No JVD, murmurs Gastrointestinal system: Abdomen is nondistended, soft and  Less tender.Normal bowel sounds heard. Central nervous system: Alert and oriented. No focal neurological deficits. Extremities: Symmetric 5 x 5 power. Skin: No rashes, lesions or ulcers Psychiatry: Judgement and insight appear normal. Mood & affect appropriate.     Data Reviewed:   CBC: Recent Labs  Lab 05/15/18 2327 05/15/18 2333 05/16/18 0543  WBC 12.2*  --  9.3  NEUTROABS 10.6*  --  7.4  HGB 12.5 13.3 11.5*  HCT 40.9 39.0 36.2  MCV 93.2  --  90.3  PLT 191  --  179   Basic Metabolic Panel: Recent Labs  Lab 05/15/18 2327 05/15/18 2333 05/16/18 0543 05/17/18 0623 05/18/18 0513  NA 138 139 140 139 140  K 3.4* 3.3* 3.3* 2.8* 4.3  CL 103 103 103 103 108  CO2 21*  --  23 24 24   GLUCOSE 176* 187* 212* 122* 89  BUN 11 11 9 13 11   CREATININE 1.16* 1.00 1.08* 1.11* 0.98  CALCIUM 8.3*  --  8.1* 7.9*  8.0*   GFR: Estimated Creatinine Clearance: 40.6 mL/min (by C-G formula based on SCr of 0.98 mg/dL). Liver Function Tests: Recent Labs  Lab 05/16/18 0543  AST 30  ALT 27  ALKPHOS 25*  BILITOT 0.8  PROT 6.7  ALBUMIN 2.9*   No results for input(s): LIPASE, AMYLASE in the last 168 hours. No results for input(s): AMMONIA in the last 168 hours. Coagulation Profile: No results for input(s): INR, PROTIME in the last 168 hours. Cardiac Enzymes: Recent Labs  Lab 05/16/18 0543  TROPONINI 0.08*   BNP (last 3  results) No results for input(s): PROBNP in the last 8760 hours. HbA1C: No results for input(s): HGBA1C in the last 72 hours. CBG: Recent Labs  Lab 05/17/18 1143 05/17/18 1651 05/17/18 2123 05/18/18 0727 05/18/18 1117  GLUCAP 161* 130* 120* 100* 130*   Lipid Profile: No results for input(s): CHOL, HDL, LDLCALC, TRIG, CHOLHDL, LDLDIRECT in the last 72 hours. Thyroid Function Tests: Recent Labs    05/16/18 0544  TSH 1.232   Anemia Panel: No results for input(s): VITAMINB12, FOLATE, FERRITIN, TIBC, IRON, RETICCTPCT in the last 72 hours. Urine analysis:    Component Value Date/Time   COLORURINE AMBER (A) 05/16/2018 0311   APPEARANCEUR HAZY (A) 05/16/2018 0311   LABSPEC 1.042 (H) 05/16/2018 0311   PHURINE 5.0 05/16/2018 0311   GLUCOSEU 50 (A) 05/16/2018 0311   HGBUR NEGATIVE 05/16/2018 0311   BILIRUBINUR NEGATIVE 05/16/2018 0311   KETONESUR 20 (A) 05/16/2018 0311   PROTEINUR 30 (A) 05/16/2018 0311   NITRITE NEGATIVE 05/16/2018 0311   LEUKOCYTESUR MODERATE (A) 05/16/2018 0311   Sepsis Labs: @LABRCNTIP (procalcitonin:4,lacticidven:4)  ) Recent Results (from the past 240 hour(s))  Culture, blood (routine x 2)     Status: None (Preliminary result)   Collection Time: 05/15/18 11:37 PM  Result Value Ref Range Status   Specimen Description BLOOD RIGHT HAND  Final   Special Requests   Final    BOTTLES DRAWN AEROBIC AND ANAEROBIC Blood Culture adequate volume   Culture   Final    NO GROWTH 1 DAY Performed at [facility], [address] [place], [place] [address]    Report Status PENDING  Incomplete  Culture, blood (routine x 2)     Status: None (Preliminary result)   Collection Time: 05/16/18  3:54 AM  Result Value Ref Range Status   Specimen Description BLOOD RIGHT ARM  Final   Special Requests   Final    BOTTLES DRAWN AEROBIC ONLY Blood Culture adequate volume   Culture   Final    NO GROWTH 1 DAY Performed at [facility], [address],  [place], [place] [address]    Report Status PENDING  Incomplete  C difficile quick scan w PCR reflex     Status: Abnormal   Collection Time: 05/16/18  9:37 AM  Result Value Ref Range Status   C Diff antigen POSITIVE (A) NEGATIVE Final   C Diff toxin POSITIVE (A) NEGATIVE Final   C Diff interpretation Toxin producing C. difficile detected.  Final    Comment: CRITICAL RESULT CALLED TO, READ BACK BY AND VERIFIED WITH: Micah Flesher RN 10:45 05/16/18 ([email or web address]) Performed at [facility], [address] [place], [place] [address]   Gastrointestinal Panel by PCR , Stool     Status: Abnormal   Collection Time: 05/16/18  9:37 AM  Result Value Ref Range Status   Campylobacter species NOT DETECTED NOT DETECTED Final   Plesimonas shigelloides NOT  DETECTED NOT DETECTED Final   Salmonella species NOT DETECTED NOT DETECTED Final   Yersinia enterocolitica NOT DETECTED NOT DETECTED Final   Vibrio species NOT DETECTED NOT DETECTED Final   Vibrio cholerae NOT DETECTED NOT DETECTED Final   Enteroaggregative E coli (EAEC) DETECTED (A) NOT DETECTED Final    Comment: RESULT CALLED TO, READ BACK BY AND VERIFIED WITH: GRAVWES SISON AT 1040 ON 05/18/2018 JJB    Enteropathogenic E coli (EPEC) NOT DETECTED NOT DETECTED Final   Enterotoxigenic E coli (ETEC) NOT DETECTED NOT DETECTED Final   Shiga like toxin producing E coli (STEC) NOT DETECTED NOT DETECTED Final   Shigella/Enteroinvasive E coli (EIEC) NOT DETECTED NOT DETECTED Final   Cryptosporidium NOT DETECTED NOT DETECTED Final   Cyclospora cayetanensis NOT DETECTED NOT DETECTED Final   Entamoeba histolytica NOT DETECTED NOT DETECTED Final   Giardia lamblia NOT DETECTED NOT DETECTED Final   Adenovirus F40/41 NOT DETECTED NOT DETECTED Final   Astrovirus NOT DETECTED NOT DETECTED Final   Norovirus GI/GII NOT DETECTED NOT DETECTED Final   Rotavirus A NOT DETECTED NOT DETECTED Final   Sapovirus (I, II, IV, and V) NOT DETECTED NOT DETECTED Final    Comment:  Performed at [facility], [address] [place], [place] [address]         Radiology Studies: No results found.      Scheduled Meds: . aspirin EC  81 mg Oral Daily  . enoxaparin (LOVENOX) injection  40 mg Subcutaneous Daily  . ezetimibe  10 mg Oral Daily  . linagliptin  5 mg Oral Daily  . omega-3 acid ethyl esters  1 g Oral Daily  . polyvinyl alcohol  1 drop Both Eyes BID  . potassium chloride  40 mEq Oral BID  . vancomycin  125 mg Oral QID   Continuous Infusions:   LOS: 2 days    Time spent: 35min    Zannie CovePreetha Laterra Lubinski, MD [facility] Page via [email or web address], password [email or web address] After 7PM please contact night-coverage  05/18/2018, 1:40 PM

## 2018-05-18 NOTE — Progress Notes (Signed)
Patient and son says she uses 2 eye drops at home, son brought in home eye drops. NP paged.

## 2018-05-18 NOTE — Progress Notes (Signed)
CRITICAL VALUE ALERT  Critical Value:  GI Panel (+) Date & Time Notied:  05/18/18 1045am Provider Notified: Dr. Jomarie LongsJoseph Orders Received/Actions taken: no new orders

## 2018-05-19 LAB — BASIC METABOLIC PANEL
Anion gap: 7 (ref 5–15)
BUN: 11 mg/dL (ref 6–20)
CALCIUM: 8 mg/dL — AB (ref 8.9–10.3)
CO2: 25 mmol/L (ref 22–32)
CREATININE: 0.96 mg/dL (ref 0.44–1.00)
Chloride: 106 mmol/L (ref 101–111)
GFR calc non Af Amer: 53 mL/min — ABNORMAL LOW (ref >60)
GLUCOSE: 121 mg/dL — AB (ref 65–99)
Potassium: 4.8 mmol/L (ref 3.5–5.1)
Sodium: 138 mmol/L (ref 135–145)

## 2018-05-19 LAB — GLUCOSE, CAPILLARY
GLUCOSE-CAPILLARY: 122 mg/dL — AB (ref 65–99)
GLUCOSE-CAPILLARY: 144 mg/dL — AB (ref 65–99)
Glucose-Capillary: 114 mg/dL — ABNORMAL HIGH (ref 65–99)
Glucose-Capillary: 130 mg/dL — ABNORMAL HIGH (ref 65–99)

## 2018-05-19 LAB — CBC
HEMATOCRIT: 34 % — AB (ref 36.0–46.0)
Hemoglobin: 10.5 g/dL — ABNORMAL LOW (ref 12.0–15.0)
MCH: 27.6 pg (ref 26.0–34.0)
MCHC: 30.9 g/dL (ref 30.0–36.0)
MCV: 89.2 fL (ref 78.0–100.0)
Platelets: 183 10*3/uL (ref 150–400)
RBC: 3.81 MIL/uL — ABNORMAL LOW (ref 3.87–5.11)
RDW: 15.8 % — AB (ref 11.5–15.5)
WBC: 8 10*3/uL (ref 4.0–10.5)

## 2018-05-19 MED ORDER — GUAIFENESIN 100 MG/5ML PO SOLN
5.0000 mL | ORAL | Status: DC | PRN
  Administered 2018-05-19 (×2): 100 mg via ORAL
  Filled 2018-05-19 (×2): qty 5

## 2018-05-19 NOTE — Progress Notes (Signed)
PROGRESS NOTE    Chelsea Ward  ZOX:[id number] DOB: 1933-06-12 DOA: 05/15/2018 PCP: Patient, No Pcp Per  Brief Narrative:Ms. Barajas is an 84 year old African-American female with history of type 2 diabetes, hypertension, dyslipidemia was seen in the emergency room on 5/23 with abdominal pain and diarrhea, found to have colitis discharged home on oral ciprofloxacin and Flagyl with modest clinical improvement, she completed her antibiotic course a few days ago with recurrence of diarrhea presented to the ER  with 5+ episodes of diarrhea, abdominal pain, fever, tachycardia, C. Difficile PCR toxin positive.  Assessment & Plan:   C. Difficile colitis -Slowly improving on oral vancomycin day 4, continues to have diffuse pain and cramps -GI pathogen panel positive for enteroaggregative Escherichia coli- clinical significance of this is unclear in the current setting, called and d/w ID Dr.Comer -likley false positive will not Rx this -continue supportive care -stopped Protonix -ambulate, physical therapy  Diabetes mellitus type 2  -continuing at Linagliptin, CBG stable  Hyperlipidemia -Continue Zetia.  Chronic kidney disease stage II -stable, creatinine at baseline  Volume overloaded -likely iatrogenic from IV fluid administration due to diarrhea and dehydration initially -improved after Lasix, off IV fluids  DVT prophylaxis:Lovenox Code Status: full code Family Communication:son at bedside Disposition Plan:home tomorrow if stable   Consultants:      Procedures:   Antimicrobials:    Subjective: -continues to have some abdominal cramps and diarrhea, overall improving, breathing much better  Objective: Vitals:   05/18/18 1942 05/19/18 0553 05/19/18 0921 05/19/18 1229  BP: 134/89 (!) 146/94 126/87 114/78  Pulse: 78 76 90 67  Resp: 18 18 14 18   Temp: 98.4 F (36.9 C) 98 F (36.7 C)  98.2 F (36.8 C)  TempSrc: Oral Oral  Oral  SpO2: 100% 100% 100% 100%  Weight:  66.4 kg  (146 lb 6.2 oz)    Height:        Intake/Output Summary (Last 24 hours) at 05/19/2018 1239 Last data filed at 05/18/2018 2200 Gross per 24 hour  Intake 300 ml  Output 200 ml  Net 100 ml   Filed Weights   05/17/18 0538 05/18/18 0621 05/19/18 0553  Weight: 67.8 kg (149 lb 8 oz) 65.9 kg (145 lb 4.5 oz) 66.4 kg (146 lb 6.2 oz)    Examination:  General exam: Appears calm and comfortable  Respiratory system: Clear to auscultation. Respiratory effort normal. Cardiovascular system: S1 & S2 heard, RRR. No JVD, murmurs Gastrointestinal system: Abdomen is nondistended, soft and  Less tender.Normal bowel sounds heard. Central nervous system: Alert and oriented. No focal neurological deficits. Extremities: Symmetric 5 x 5 power. Skin: No rashes, lesions or ulcers Psychiatry: Judgement and insight appear normal. Mood & affect appropriate.     Data Reviewed:   CBC: Recent Labs  Lab 05/15/18 2327 05/15/18 2333 05/16/18 0543 05/19/18 0350  WBC 12.2*  --  9.3 8.0  NEUTROABS 10.6*  --  7.4  --   HGB 12.5 13.3 11.5* 10.5*  HCT 40.9 39.0 36.2 34.0*  MCV 93.2  --  90.3 89.2  PLT 191  --  179 183   Basic Metabolic Panel: Recent Labs  Lab 05/15/18 2327 05/15/18 2333 05/16/18 0543 05/17/18 0623 05/18/18 0513 05/19/18 0350  NA 138 139 140 139 140 138  K 3.4* 3.3* 3.3* 2.8* 4.3 4.8  CL 103 103 103 103 108 106  CO2 21*  --  23 24 24 25   GLUCOSE 176* 187* 212* 122* 89 121*  BUN 11 11 9  13  11 11  CREATININE 1.16* 1.00 1.08* 1.11* 0.98 0.96  CALCIUM 8.3*  --  8.1* 7.9* 8.0* 8.0*   GFR: Estimated Creatinine Clearance: 41.6 mL/min (by C-G formula based on SCr of 0.96 mg/dL). Liver Function Tests: Recent Labs  Lab 05/16/18 0543  AST 30  ALT 27  ALKPHOS 25*  BILITOT 0.8  PROT 6.7  ALBUMIN 2.9*   No results for input(s): LIPASE, AMYLASE in the last 168 hours. No results for input(s): AMMONIA in the last 168 hours. Coagulation Profile: No results for input(s): INR, PROTIME in  the last 168 hours. Cardiac Enzymes: Recent Labs  Lab 05/16/18 0543  TROPONINI 0.08*   BNP (last 3 results) No results for input(s): PROBNP in the last 8760 hours. HbA1C: No results for input(s): HGBA1C in the last 72 hours. CBG: Recent Labs  Lab 05/18/18 1117 05/18/18 1645 05/18/18 2117 05/19/18 0737 05/19/18 1227  GLUCAP 130* 119* 125* 114* 130*   Lipid Profile: No results for input(s): CHOL, HDL, LDLCALC, TRIG, CHOLHDL, LDLDIRECT in the last 72 hours. Thyroid Function Tests: No results for input(s): TSH, T4TOTAL, FREET4, T3FREE, THYROIDAB in the last 72 hours. Anemia Panel: No results for input(s): VITAMINB12, FOLATE, FERRITIN, TIBC, IRON, RETICCTPCT in the last 72 hours. Urine analysis:    Component Value Date/Time   COLORURINE AMBER (A) 05/16/2018 0311   APPEARANCEUR HAZY (A) 05/16/2018 0311   LABSPEC 1.042 (H) 05/16/2018 0311   PHURINE 5.0 05/16/2018 0311   GLUCOSEU 50 (A) 05/16/2018 0311   HGBUR NEGATIVE 05/16/2018 0311   BILIRUBINUR NEGATIVE 05/16/2018 0311   KETONESUR 20 (A) 05/16/2018 0311   PROTEINUR 30 (A) 05/16/2018 0311   NITRITE NEGATIVE 05/16/2018 0311   LEUKOCYTESUR MODERATE (A) 05/16/2018 0311   Sepsis Labs: @LABRCNTIP (procalcitonin:4,lacticidven:4)  ) Recent Results (from the past 240 hour(s))  Culture, blood (routine x 2)     Status: None (Preliminary result)   Collection Time: 05/15/18 11:37 PM  Result Value Ref Range Status   Specimen Description BLOOD RIGHT HAND  Final   Special Requests   Final    BOTTLES DRAWN AEROBIC AND ANAEROBIC Blood Culture adequate volume   Culture   Final    NO GROWTH 3 DAYS Performed at [facility], [address] [place], [place] [address]    Report Status PENDING  Incomplete  Culture, blood (routine x 2)     Status: None (Preliminary result)   Collection Time: 05/16/18  3:54 AM  Result Value Ref Range Status   Specimen Description BLOOD RIGHT ARM  Final   Special Requests   Final    BOTTLES  DRAWN AEROBIC ONLY Blood Culture adequate volume   Culture   Final    NO GROWTH 3 DAYS Performed at [facility], [address] [place], [place] [address]    Report Status PENDING  Incomplete  C difficile quick scan w PCR reflex     Status: Abnormal   Collection Time: 05/16/18  9:37 AM  Result Value Ref Range Status   C Diff antigen POSITIVE (A) NEGATIVE Final   C Diff toxin POSITIVE (A) NEGATIVE Final   C Diff interpretation Toxin producing C. difficile detected.  Final    Comment: CRITICAL RESULT CALLED TO, READ BACK BY AND VERIFIED WITH: Micah Flesher RN 10:45 05/16/18 ([email or web address]) Performed at [facility], [address], [place], [place] [address]   Gastrointestinal Panel by PCR , Stool     Status: Abnormal   Collection Time: 05/16/18  9:37  AM  Result Value Ref Range Status   Campylobacter species NOT DETECTED NOT DETECTED Final   Plesimonas shigelloides NOT DETECTED NOT DETECTED Final   Salmonella species NOT DETECTED NOT DETECTED Final   Yersinia enterocolitica NOT DETECTED NOT DETECTED Final   Vibrio species NOT DETECTED NOT DETECTED Final   Vibrio cholerae NOT DETECTED NOT DETECTED Final   Enteroaggregative E coli (EAEC) DETECTED (A) NOT DETECTED Final    Comment: RESULT CALLED TO, READ BACK BY AND VERIFIED WITH: GRAVWES SISON AT 1040 ON 05/18/2018 JJB    Enteropathogenic E coli (EPEC) NOT DETECTED NOT DETECTED Final   Enterotoxigenic E coli (ETEC) NOT DETECTED NOT DETECTED Final   Shiga like toxin producing E coli (STEC) NOT DETECTED NOT DETECTED Final   Shigella/Enteroinvasive E coli (EIEC) NOT DETECTED NOT DETECTED Final   Cryptosporidium NOT DETECTED NOT DETECTED Final   Cyclospora cayetanensis NOT DETECTED NOT DETECTED Final   Entamoeba histolytica NOT DETECTED NOT DETECTED Final   Giardia lamblia NOT DETECTED NOT DETECTED Final   Adenovirus F40/41 NOT DETECTED NOT DETECTED Final   Astrovirus NOT DETECTED NOT DETECTED Final   Norovirus GI/GII NOT DETECTED  NOT DETECTED Final   Rotavirus A NOT DETECTED NOT DETECTED Final   Sapovirus (I, II, IV, and V) NOT DETECTED NOT DETECTED Final    Comment: Performed at [facility], [address] [place], [place] [address]         Radiology Studies: No results found.      Scheduled Meds: . aspirin EC  81 mg Oral Daily  . enoxaparin (LOVENOX) injection  40 mg Subcutaneous Daily  . ezetimibe  10 mg Oral Daily  . linagliptin  5 mg Oral Daily  . omega-3 acid ethyl esters  1 g Oral Daily  . polyvinyl alcohol  1 drop Both Eyes BID  . vancomycin  125 mg Oral QID   Continuous Infusions:   LOS: 3 days    Time spent: 25min    Zannie CovePreetha Gethsemane Fischler, MD [facility] Page via [email or web address], password [email or web address] After 7PM please contact night-coverage  05/19/2018, 12:39 PM

## 2018-05-20 LAB — CBC
HCT: 36.1 % (ref 36.0–46.0)
Hemoglobin: 11.2 g/dL — ABNORMAL LOW (ref 12.0–15.0)
MCH: 27.9 pg (ref 26.0–34.0)
MCHC: 31 g/dL (ref 30.0–36.0)
MCV: 90 fL (ref 78.0–100.0)
PLATELETS: 202 10*3/uL (ref 150–400)
RBC: 4.01 MIL/uL (ref 3.87–5.11)
RDW: 15.7 % — AB (ref 11.5–15.5)
WBC: 8.3 10*3/uL (ref 4.0–10.5)

## 2018-05-20 LAB — GLUCOSE, CAPILLARY
GLUCOSE-CAPILLARY: 116 mg/dL — AB (ref 65–99)
Glucose-Capillary: 132 mg/dL — ABNORMAL HIGH (ref 65–99)
Glucose-Capillary: 135 mg/dL — ABNORMAL HIGH (ref 65–99)

## 2018-05-20 LAB — BASIC METABOLIC PANEL
ANION GAP: 10 (ref 5–15)
BUN: 11 mg/dL (ref 6–20)
CALCIUM: 8.2 mg/dL — AB (ref 8.9–10.3)
CO2: 24 mmol/L (ref 22–32)
Chloride: 106 mmol/L (ref 101–111)
Creatinine, Ser: 0.95 mg/dL (ref 0.44–1.00)
GFR, EST NON AFRICAN AMERICAN: 54 mL/min — AB (ref >60)
GLUCOSE: 123 mg/dL — AB (ref 65–99)
Potassium: 4.2 mmol/L (ref 3.5–5.1)
Sodium: 140 mmol/L (ref 135–145)

## 2018-05-20 MED ORDER — POTASSIUM CHLORIDE CRYS ER 20 MEQ PO TBCR
20.0000 meq | EXTENDED_RELEASE_TABLET | Freq: Once | ORAL | Status: AC
  Administered 2018-05-20: 20 meq via ORAL
  Filled 2018-05-20: qty 1

## 2018-05-20 MED ORDER — FUROSEMIDE 10 MG/ML IJ SOLN
20.0000 mg | Freq: Once | INTRAMUSCULAR | Status: AC
  Administered 2018-05-20: 20 mg via INTRAVENOUS
  Filled 2018-05-20: qty 2

## 2018-05-20 MED ORDER — HYOSCYAMINE SULFATE ER 0.375 MG PO TB12
0.3750 mg | ORAL_TABLET | Freq: Two times a day (BID) | ORAL | Status: AC
  Administered 2018-05-20 (×2): 0.375 mg via ORAL
  Filled 2018-05-20 (×2): qty 1

## 2018-05-20 NOTE — Care Management Note (Addendum)
Case Management Note  Patient Details  Name: Chelsea RabonOdessa Ward MRN: [id number] Date of Birth: [birth year]  Subjective/Objective:   Pt presents for SIRS.  Pt from home with son who is available 24/7.  Pt son drives her to appointments and pharmacy.  Pt has not used HH and has no DME.                Action/Plan: Pt requests RW as recommended by PT.  [facility]  Pt has no preference of HH agency.  [facility]  [facility]   Expected Discharge Date:  05/16/18               Expected Discharge Plan:  Home/Self Care  In-House Referral:     Discharge planning Services  CM Consult  Post Acute Care Choice:  Durable Medical Equipment, Home Health Choice offered to:  Patient  DME Arranged:  [place] [facility] DME Agency:  [facility]  HH Arranged[facility]  [facility]  [facility]  Status of Service:  Completed, signed off  If discussed at Long Length of Stay Meetings, dates discussed:    Additional Comments: 1700- Spoke with son, Chelsea Ward, regarding PCP.  Chelsea Ward states that patient's MD is in [place] as they have not yet found one in [place] since the patient's move.  MD is Dr[facility] Lawernce [facility]byn Brewer at [facility]- [phone].  Info relayed to [place] [facility].    Deveron FurlongAshley  Orlandis Sanden, RN 05/20/2018, 11:41 AM

## 2018-05-20 NOTE — Progress Notes (Signed)
PROGRESS NOTE    Chelsea Ward  ZOX:[id number] DOB: 1933-01-30 DOA: 05/15/2018 PCP: Patient, No Pcp Per  Brief Narrative:Ms. Hokenson is an 84 year old African-American female with history of type 2 diabetes, hypertension, dyslipidemia was seen in the emergency room on 5/23 with abdominal pain and diarrhea, found to have colitis discharged home on oral ciprofloxacin and Flagyl with modest clinical improvement, she completed her antibiotic course a few days ago with recurrence of diarrhea presented to the ER  with 5+ episodes of diarrhea, abdominal pain, fever, tachycardia, C. Difficile PCR toxin positive.  Assessment & Plan:   C. Difficile colitis -very slow improvement, continue oral vancomycin day 5, will add hyoscyamine for cramps -GI pathogen panel positive for enteroaggregative Escherichia coli- clinical significance of this is unclear in the current setting, called and d/w ID Dr.Comer -likley false positive will not Rx this -levsin, antiemetics -stopped Protonix -ambulate, physical therapy  Diabetes mellitus type 2  -continuing at Linagliptin, CBG stable  Hyperlipidemia -Continue Zetia.  Chronic kidney disease stage II -stable, creatinine at baseline  Volume overloaded -likely iatrogenic from IV fluid administration due to diarrhea and dehydration initially -was given single dose of IV Lasix 2 days ago, gave another dose now  DVT prophylaxis:Lovenox Code Status: full code Family Communication:son at bedside Disposition Plan: hopefully home 6/17 if diarrhea and abdominal symptoms improving  Consultants:      Procedures:   Antimicrobials:    Subjective: -reports productive cough, continues to have diarrhea and abdominal cramps but slowly improving  Objective: Vitals:   05/19/18 1229 05/19/18 1938 05/20/18 0604 05/20/18 1155  BP: 114/78 119/66 139/73 130/87  Pulse: 67 66 76 66  Resp: 18 18 18 18   Temp: 98.2 F (36.8 C) 98.2 F (36.8 C) 98 F (36.7 C) 98 F  (36.7 C)  TempSrc: Oral Oral Oral Oral  SpO2: 100% 100% 100% 99%  Weight:   64.8 kg (142 lb 13.7 oz)   Height:        Intake/Output Summary (Last 24 hours) at 05/20/2018 1157 Last data filed at 05/20/2018 0500 Gross per 24 hour  Intake 360 ml  Output 1002 ml  Net -642 ml   Filed Weights   05/18/18 0621 05/19/18 0553 05/20/18 0604  Weight: 65.9 kg (145 lb 4.5 oz) 66.4 kg (146 lb 6.2 oz) 64.8 kg (142 lb 13.7 oz)    Examination:  General exam: Appears calm and comfortable, no distress Respiratory system: fine basilar crackles Cardiovascular system: S1 & S2 heard, RRR. No JVD, murmurs Gastrointestinal system: Abdomen is nondistended, soft and  Less tender.Normal bowel sounds heard. Central nervous system: Alert and oriented. No focal neurological deficits. Extremities: Symmetric 5 x 5 power. Skin: No rashes, lesions or ulcers Psychiatry: Judgement and insight appear normal. Mood & affect appropriate.     Data Reviewed:   CBC: Recent Labs  Lab 05/15/18 2327 05/15/18 2333 05/16/18 0543 05/19/18 0350 05/20/18 0825  WBC 12.2*  --  9.3 8.0 8.3  NEUTROABS 10.6*  --  7.4  --   --   HGB 12.5 13.3 11.5* 10.5* 11.2*  HCT 40.9 39.0 36.2 34.0* 36.1  MCV 93.2  --  90.3 89.2 90.0  PLT 191  --  179 183 202   Basic Metabolic Panel: Recent Labs  Lab 05/16/18 0543 05/17/18 0623 05/18/18 0513 05/19/18 0350 05/20/18 0825  NA 140 139 140 138 140  K 3.3* 2.8* 4.3 4.8 4.2  CL 103 103 108 106 106  CO2 23 24 24 25  24  GLUCOSE 212* 122* 89 121* 123*  BUN 9 13 11 11 11   CREATININE 1.08* 1.11* 0.98 0.96 0.95  CALCIUM 8.1* 7.9* 8.0* 8.0* 8.2*   GFR: Estimated Creatinine Clearance: 38.7 mL/min (by C-G formula based on SCr of 0.95 mg/dL). Liver Function Tests: Recent Labs  Lab 05/16/18 0543  AST 30  ALT 27  ALKPHOS 25*  BILITOT 0.8  PROT 6.7  ALBUMIN 2.9*   No results for input(s): LIPASE, AMYLASE in the last 168 hours. No results for input(s): AMMONIA in the last 168  hours. Coagulation Profile: No results for input(s): INR, PROTIME in the last 168 hours. Cardiac Enzymes: Recent Labs  Lab 05/16/18 0543  TROPONINI 0.08*   BNP (last 3 results) No results for input(s): PROBNP in the last 8760 hours. HbA1C: No results for input(s): HGBA1C in the last 72 hours. CBG: Recent Labs  Lab 05/19/18 1227 05/19/18 1611 05/19/18 2100 05/20/18 0722 05/20/18 1156  GLUCAP 130* 122* 144* 132* 135*   Lipid Profile: No results for input(s): CHOL, HDL, LDLCALC, TRIG, CHOLHDL, LDLDIRECT in the last 72 hours. Thyroid Function Tests: No results for input(s): TSH, T4TOTAL, FREET4, T3FREE, THYROIDAB in the last 72 hours. Anemia Panel: No results for input(s): VITAMINB12, FOLATE, FERRITIN, TIBC, IRON, RETICCTPCT in the last 72 hours. Urine analysis:    Component Value Date/Time   COLORURINE AMBER (A) 05/16/2018 0311   APPEARANCEUR HAZY (A) 05/16/2018 0311   LABSPEC 1.042 (H) 05/16/2018 0311   PHURINE 5.0 05/16/2018 0311   GLUCOSEU 50 (A) 05/16/2018 0311   HGBUR NEGATIVE 05/16/2018 0311   BILIRUBINUR NEGATIVE 05/16/2018 0311   KETONESUR 20 (A) 05/16/2018 0311   PROTEINUR 30 (A) 05/16/2018 0311   NITRITE NEGATIVE 05/16/2018 0311   LEUKOCYTESUR MODERATE (A) 05/16/2018 0311   Sepsis Labs: @LABRCNTIP (procalcitonin:4,lacticidven:4)  ) Recent Results (from the past 240 hour(s))  Culture, blood (routine x 2)     Status: None (Preliminary result)   Collection Time: 05/15/18 11:37 PM  Result Value Ref Range Status   Specimen Description BLOOD RIGHT HAND  Final   Special Requests   Final    BOTTLES DRAWN AEROBIC AND ANAEROBIC Blood Culture adequate volume   Culture   Final    NO GROWTH 4 DAYS Performed at [facility], [address] [place], [place] [address]    Report Status PENDING  Incomplete  Culture, blood (routine x 2)     Status: None (Preliminary result)   Collection Time: 05/16/18  3:54 AM  Result Value Ref Range Status   Specimen  Description BLOOD RIGHT ARM  Final   Special Requests   Final    BOTTLES DRAWN AEROBIC ONLY Blood Culture adequate volume   Culture   Final    NO GROWTH 4 DAYS Performed at [facility], [address] [place], [place] [address]    Report Status PENDING  Incomplete  C difficile quick scan w PCR reflex     Status: Abnormal   Collection Time: 05/16/18  9:37 AM  Result Value Ref Range Status   C Diff antigen POSITIVE (A) NEGATIVE Final   C Diff toxin POSITIVE (A) NEGATIVE Final   C Diff interpretation Toxin producing C. difficile detected.  Final    Comment: CRITICAL RESULT CALLED TO, READ BACK BY AND VERIFIED WITH: Micah Flesher RN 10:45 05/16/18 ([email or web address]) Performed at [facility], [address] [place], [place] [address]   Gastrointestinal Panel by PCR , Stool     Status: Abnormal  Collection Time: 05/16/18  9:37 AM  Result Value Ref Range Status   Campylobacter species NOT DETECTED NOT DETECTED Final   Plesimonas shigelloides NOT DETECTED NOT DETECTED Final   Salmonella species NOT DETECTED NOT DETECTED Final   Yersinia enterocolitica NOT DETECTED NOT DETECTED Final   Vibrio species NOT DETECTED NOT DETECTED Final   Vibrio cholerae NOT DETECTED NOT DETECTED Final   Enteroaggregative E coli (EAEC) DETECTED (A) NOT DETECTED Final    Comment: RESULT CALLED TO, READ BACK BY AND VERIFIED WITH: GRAVWES SISON AT 1040 ON 05/18/2018 JJB    Enteropathogenic E coli (EPEC) NOT DETECTED NOT DETECTED Final   Enterotoxigenic E coli (ETEC) NOT DETECTED NOT DETECTED Final   Shiga like toxin producing E coli (STEC) NOT DETECTED NOT DETECTED Final   Shigella/Enteroinvasive E coli (EIEC) NOT DETECTED NOT DETECTED Final   Cryptosporidium NOT DETECTED NOT DETECTED Final   Cyclospora cayetanensis NOT DETECTED NOT DETECTED Final   Entamoeba histolytica NOT DETECTED NOT DETECTED Final   Giardia lamblia NOT DETECTED NOT DETECTED Final   Adenovirus F40/41 NOT DETECTED NOT DETECTED Final    Astrovirus NOT DETECTED NOT DETECTED Final   Norovirus GI/GII NOT DETECTED NOT DETECTED Final   Rotavirus A NOT DETECTED NOT DETECTED Final   Sapovirus (I, II, IV, and V) NOT DETECTED NOT DETECTED Final    Comment: Performed at [facility], [address] [place] [address] [place], [place] [address]         Radiology Studies: No results found.      Scheduled Meds: . aspirin EC  81 mg Oral Daily  . enoxaparin (LOVENOX) injection  40 mg Subcutaneous Daily  . ezetimibe  10 mg Oral Daily  . furosemide  20 mg Intravenous Once  . hyoscyamine  0.375 mg Oral Q12H  . linagliptin  5 mg Oral Daily  . omega-3 acid ethyl esters  1 g Oral Daily  . polyvinyl alcohol  1 drop Both Eyes BID  . potassium chloride  20 mEq Oral Once  . vancomycin  125 mg Oral QID   Continuous Infusions:   LOS: 4 days    Time spent: 25min    Zannie CovePreetha Manning Luna, MD [facility] Page via [email or web address], password [email or web address] After 7PM please contact night-coverage  05/20/2018, 11:57 AM

## 2018-05-20 NOTE — Plan of Care (Signed)
  Problem: Health Behavior/Discharge Planning: Goal: Ability to manage health-related needs will improve Outcome: Adequate for Discharge   

## 2018-05-21 ENCOUNTER — Inpatient Hospital Stay (HOSPITAL_COMMUNITY): Payer: Medicare Other

## 2018-05-21 LAB — GLUCOSE, CAPILLARY
GLUCOSE-CAPILLARY: 126 mg/dL — AB (ref 65–99)
GLUCOSE-CAPILLARY: 144 mg/dL — AB (ref 65–99)
GLUCOSE-CAPILLARY: 107 mg/dL — AB (ref 65–99)
Glucose-Capillary: 139 mg/dL — ABNORMAL HIGH (ref 65–99)
Glucose-Capillary: 124 mg/dL — ABNORMAL HIGH (ref 65–99)

## 2018-05-21 LAB — CBC
HEMATOCRIT: 35.8 % — AB (ref 36.0–46.0)
Hemoglobin: 11.3 g/dL — ABNORMAL LOW (ref 12.0–15.0)
MCH: 27.9 pg (ref 26.0–34.0)
MCHC: 31.6 g/dL (ref 30.0–36.0)
MCV: 88.4 fL (ref 78.0–100.0)
Platelets: 216 10*3/uL (ref 150–400)
RBC: 4.05 MIL/uL (ref 3.87–5.11)
RDW: 15.7 % — ABNORMAL HIGH (ref 11.5–15.5)
WBC: 9.2 10*3/uL (ref 4.0–10.5)

## 2018-05-21 LAB — BASIC METABOLIC PANEL
Anion gap: 9 (ref 5–15)
BUN: 11 mg/dL (ref 6–20)
CALCIUM: 8.4 mg/dL — AB (ref 8.9–10.3)
CHLORIDE: 105 mmol/L (ref 101–111)
CO2: 26 mmol/L (ref 22–32)
CREATININE: 1.03 mg/dL — AB (ref 0.44–1.00)
GFR calc Af Amer: 57 mL/min — ABNORMAL LOW (ref >60)
GFR calc non Af Amer: 49 mL/min — ABNORMAL LOW (ref >60)
GLUCOSE: 135 mg/dL — AB (ref 65–99)
Potassium: 4.3 mmol/L (ref 3.5–5.1)
Sodium: 140 mmol/L (ref 135–145)

## 2018-05-21 LAB — CULTURE, BLOOD (ROUTINE X 2)
Culture: NO GROWTH
Culture: NO GROWTH
Special Requests: ADEQUATE
Special Requests: ADEQUATE

## 2018-05-21 LAB — TSH: TSH: 2.472 u[IU]/mL (ref 0.350–4.500)

## 2018-05-21 LAB — T4, FREE: Free T4: 1.35 ng/dL (ref 0.82–1.77)

## 2018-05-21 MED ORDER — METOPROLOL TARTRATE 25 MG PO TABS
25.0000 mg | ORAL_TABLET | Freq: Two times a day (BID) | ORAL | Status: DC
  Administered 2018-05-21 – 2018-05-22 (×4): 25 mg via ORAL
  Filled 2018-05-21 (×4): qty 1

## 2018-05-21 MED ORDER — FUROSEMIDE 40 MG PO TABS
40.0000 mg | ORAL_TABLET | Freq: Every day | ORAL | Status: DC
  Administered 2018-05-21 – 2018-05-22 (×2): 40 mg via ORAL
  Filled 2018-05-21 (×2): qty 1

## 2018-05-21 MED ORDER — HYDROCORTISONE 0.5 % EX CREA
1.0000 "application " | TOPICAL_CREAM | Freq: Four times a day (QID) | CUTANEOUS | Status: DC | PRN
  Administered 2018-05-22: 1 via TOPICAL
  Filled 2018-05-21 (×2): qty 28.35

## 2018-05-21 NOTE — Progress Notes (Addendum)
PROGRESS NOTE    Chelsea Ward  ZOX:[id number] DOB: Nov 09, 1933 DOA: 05/15/2018 PCP: Patient, No Pcp Per  Brief Narrative:Chelsea Ward is an 84 year old African-American female with history of type 2 diabetes, hypertension, dyslipidemia was seen in the emergency room on 5/23 with abdominal pain and diarrhea, found to have colitis discharged home on oral ciprofloxacin and Flagyl with modest clinical improvement, she completed her antibiotic course a few days ago with recurrence of diarrhea presented to the ER  with 5+ episodes of diarrhea, abdominal pain, fever, tachycardia, C. Difficile PCR toxin positive.  Assessment & Plan:   C. Difficile colitis -very slow improvement, continue oral vancomycin day 6 -GI pathogen panel positive for enteroaggregative Escherichia coli- clinical significance of this is unclear in the current setting, called and d/w ID Dr.Comer -likley false positive will not Rx this -continue supportive care, antiemetics -stopped Protonix -ambulate, physical therapy  Tachycardia -overnight has remained tachycardic, no chest pain, dyspnea or hypoxia -EKG with sinus tachycardia and PVCs -Check chest x-ray today, TSH, ECHO  Diabetes mellitus type 2  -continuing at Linagliptin, CBG stable  Hyperlipidemia -Continue Zetia.  Chronic kidney disease stage II -stable, creatinine at baseline  Acute and chronic CHF -was volume overloaded 2 days ago, given IV Lasix 2 days -Appears euvolemic now -Check echocardiogram, cardiac history unknown, no records in our system and very poor health literacy, son is unable to give me any information, apparently was hospitalized in [place] for CHF many times -Start oral Lasix today, add beta blocker -appears euvolemic now, this -request records from [facility] in [place]  DVT prophylaxis:Lovenox Code Status: full code Family Communication:No family at bedside Disposition Plan: home in 1-2 days pending evaluation of CHF,  improvement in tachycardia  Consultants:      Procedures:   Antimicrobials:  Antibiotics Given (last 72 hours)    Date/Time Action Medication Dose   05/18/18 1656 Given   vancomycin (VANCOCIN) 50 mg/mL oral solution 125 mg 125 mg   05/18/18 2209 Given   vancomycin (VANCOCIN) 50 mg/mL oral solution 125 mg 125 mg   05/19/18 0917 Given   vancomycin (VANCOCIN) 50 mg/mL oral solution 125 mg 125 mg   05/19/18 1351 Given   vancomycin (VANCOCIN) 50 mg/mL oral solution 125 mg 125 mg   05/19/18 1634 Given   vancomycin (VANCOCIN) 50 mg/mL oral solution 125 mg 125 mg   05/19/18 2253 Given   vancomycin (VANCOCIN) 50 mg/mL oral solution 125 mg 125 mg   05/20/18 1031 Given   vancomycin (VANCOCIN) 50 mg/mL oral solution 125 mg 125 mg   05/20/18 1306 Given   vancomycin (VANCOCIN) 50 mg/mL oral solution 125 mg 125 mg   05/20/18 1804 Given   vancomycin (VANCOCIN) 50 mg/mL oral solution 125 mg 125 mg   05/20/18 2309 Given   vancomycin (VANCOCIN) 50 mg/mL oral solution 125 mg 125 mg   05/21/18 1118 Given   vancomycin (VANCOCIN) 50 mg/mL oral solution 125 mg 125 mg      Subjective: -complains of cough, slight dizziness, no chest pain, no shortness of breath  Objective: Vitals:   05/21/18 0509 05/21/18 0514 05/21/18 0909 05/21/18 1231  BP:  104/83 120/70 111/77  Pulse:  83 (!) 54 89  Resp:  18    Temp:  98.4 F (36.9 C)  98.1 F (36.7 C)  TempSrc:  Oral  Oral  SpO2:  99% 98% 100%  Weight: 63.6 kg (140 lb 4.8 oz)     Height:  Intake/Output Summary (Last 24 hours) at 05/21/2018 1359 Last data filed at 05/21/2018 1004 Gross per 24 hour  Intake 600 ml  Output 750 ml  Net -150 ml   Filed Weights   05/19/18 0553 05/20/18 0604 05/21/18 0509  Weight: 66.4 kg (146 lb 6.2 oz) 64.8 kg (142 lb 13.7 oz) 63.6 kg (140 lb 4.8 oz)    Examination:  Gen: Awake, Alert, Oriented X 3, no distress HEENT: PERRLA, Neck supple, no JVD Lungs: good air movement, few rhonchi at the left CVS:  RRR,No Gallops,Rubs or new Murmurs Abd: soft, Non tender, non distended, BS present Extremities: No Cyanosis, Clubbing or edema Skin: no new rashes Psychiatry: Judgement and insight appear normal. Mood & affect appropriate.     Data Reviewed:   CBC: Recent Labs  Lab 05/15/18 2327 05/15/18 2333 05/16/18 0543 05/19/18 0350 05/20/18 0825 05/21/18 0453  WBC 12.2*  --  9.3 8.0 8.3 9.2  NEUTROABS 10.6*  --  7.4  --   --   --   HGB 12.5 13.3 11.5* 10.5* 11.2* 11.3*  HCT 40.9 39.0 36.2 34.0* 36.1 35.8*  MCV 93.2  --  90.3 89.2 90.0 88.4  PLT 191  --  179 183 202 216   Basic Metabolic Panel: Recent Labs  Lab 05/17/18 0623 05/18/18 0513 05/19/18 0350 05/20/18 0825 05/21/18 0453  NA 139 140 138 140 140  K 2.8* 4.3 4.8 4.2 4.3  CL 103 108 106 106 105  CO2 24 24 25 24 26   GLUCOSE 122* 89 121* 123* 135*  BUN 13 11 11 11 11   CREATININE 1.11* 0.98 0.96 0.95 1.03*  CALCIUM 7.9* 8.0* 8.0* 8.2* 8.4*   GFR: Estimated Creatinine Clearance: 35.7 mL/min (A) (by C-G formula based on SCr of 1.03 mg/dL (H)). Liver Function Tests: Recent Labs  Lab 05/16/18 0543  AST 30  ALT 27  ALKPHOS 25*  BILITOT 0.8  PROT 6.7  ALBUMIN 2.9*   No results for input(s): LIPASE, AMYLASE in the last 168 hours. No results for input(s): AMMONIA in the last 168 hours. Coagulation Profile: No results for input(s): INR, PROTIME in the last 168 hours. Cardiac Enzymes: Recent Labs  Lab 05/16/18 0543  TROPONINI 0.08*   BNP (last 3 results) No results for input(s): PROBNP in the last 8760 hours. HbA1C: No results for input(s): HGBA1C in the last 72 hours. CBG: Recent Labs  Lab 05/20/18 0722 05/20/18 1156 05/20/18 1618 05/21/18 0805 05/21/18 1227  GLUCAP 132* 135* 116* 139* 124*   Lipid Profile: No results for input(s): CHOL, HDL, LDLCALC, TRIG, CHOLHDL, LDLDIRECT in the last 72 hours. Thyroid Function Tests: No results for input(s): TSH, T4TOTAL, FREET4, T3FREE, THYROIDAB in the last 72  hours. Anemia Panel: No results for input(s): VITAMINB12, FOLATE, FERRITIN, TIBC, IRON, RETICCTPCT in the last 72 hours. Urine analysis:    Component Value Date/Time   COLORURINE AMBER (A) 05/16/2018 0311   APPEARANCEUR HAZY (A) 05/16/2018 0311   LABSPEC 1.042 (H) 05/16/2018 0311   PHURINE 5.0 05/16/2018 0311   GLUCOSEU 50 (A) 05/16/2018 0311   HGBUR NEGATIVE 05/16/2018 0311   BILIRUBINUR NEGATIVE 05/16/2018 0311   KETONESUR 20 (A) 05/16/2018 0311   PROTEINUR 30 (A) 05/16/2018 0311   NITRITE NEGATIVE 05/16/2018 0311   LEUKOCYTESUR MODERATE (A) 05/16/2018 0311   Sepsis Labs: @LABRCNTIP (procalcitonin:4,lacticidven:4)  ) Recent Results (from the past 240 hour(s))  Culture, blood (routine x 2)     Status: None   Collection Time: 05/15/18 11:37 PM  Result Value  Ref Range Status   Specimen Description BLOOD RIGHT HAND  Final   Special Requests   Final    BOTTLES DRAWN AEROBIC AND ANAEROBIC Blood Culture adequate volume   Culture   Final    NO GROWTH 5 DAYS Performed at [facility], [address] [place], [place] [address]    Report Status 05/21/2018 FINAL  Final  Culture, blood (routine x 2)     Status: None   Collection Time: 05/16/18  3:54 AM  Result Value Ref Range Status   Specimen Description BLOOD RIGHT ARM  Final   Special Requests   Final    BOTTLES DRAWN AEROBIC ONLY Blood Culture adequate volume   Culture   Final    NO GROWTH 5 DAYS Performed at [facility], [address], [place], [place] [address]    Report Status 05/21/2018 FINAL  Final  C difficile quick scan w PCR reflex     Status: Abnormal   Collection Time: 05/16/18  9:37 AM  Result Value Ref Range Status   C Diff antigen POSITIVE (A) NEGATIVE Final   C Diff toxin POSITIVE (A) NEGATIVE Final   C Diff interpretation Toxin producing C. difficile detected.  Final    Comment: CRITICAL RESULT CALLED TO, READ BACK BY AND VERIFIED WITH: Micah Flesher RN 10:45 05/16/18 ([email or web address]) Performed at [facility], [address], [place], [place] [address]   Gastrointestinal Panel by PCR , Stool     Status: Abnormal   Collection Time: 05/16/18  9:37 AM  Result Value Ref Range Status   Campylobacter species NOT DETECTED NOT DETECTED Final   Plesimonas shigelloides NOT DETECTED NOT DETECTED Final   Salmonella species NOT DETECTED NOT DETECTED Final   Yersinia enterocolitica NOT DETECTED NOT DETECTED Final   Vibrio species NOT DETECTED NOT DETECTED Final   Vibrio cholerae NOT DETECTED NOT DETECTED Final   Enteroaggregative E coli (EAEC) DETECTED (A) NOT DETECTED Final    Comment: RESULT CALLED TO, READ BACK BY AND VERIFIED WITH: GRAVWES SISON AT 1040 ON 05/18/2018 JJB    Enteropathogenic E coli (EPEC) NOT DETECTED NOT DETECTED Final   Enterotoxigenic E coli (ETEC) NOT DETECTED NOT DETECTED Final   Shiga like toxin producing E coli (STEC) NOT DETECTED NOT DETECTED Final   Shigella/Enteroinvasive E coli (EIEC) NOT DETECTED NOT DETECTED Final   Cryptosporidium NOT DETECTED NOT DETECTED Final   Cyclospora cayetanensis NOT DETECTED NOT DETECTED Final   Entamoeba histolytica NOT DETECTED NOT DETECTED Final   Giardia lamblia NOT DETECTED NOT DETECTED Final   Adenovirus F40/41 NOT DETECTED NOT DETECTED Final   Astrovirus NOT DETECTED NOT DETECTED Final   Norovirus GI/GII NOT DETECTED NOT DETECTED Final   Rotavirus A NOT DETECTED NOT DETECTED Final   Sapovirus (I, II, IV, and V) NOT DETECTED NOT DETECTED Final    Comment: Performed at [facility], [address], [place], [place] [address]         Radiology Studies: Dg Chest 2 View  Result Date: 05/21/2018 CLINICAL DATA:  Hypoxia, follow-up, diabetes mellitus, hypertension, heart disease EXAM: CHEST - 2 VIEW COMPARISON:  05/15/2018 FINDINGS: Enlargement of cardiac silhouette. Atherosclerotic calcification aorta. Mediastinal contours and pulmonary vascularity normal. LEFT basilar subsegmental atelectasis. Lungs otherwise clear.  No pulmonary infiltrate, pleural effusion or pneumothorax. Bones demineralized. IMPRESSION: Enlargement of cardiac silhouette with LEFT basilar subsegmental atelectasis. Electronically Signed   By: Ulyses Southward M.D.   On: 05/21/2018 13:28  Scheduled Meds: . aspirin EC  81 mg Oral Daily  . enoxaparin (LOVENOX) injection  40 mg Subcutaneous Daily  . ezetimibe  10 mg Oral Daily  . linagliptin  5 mg Oral Daily  . metoprolol tartrate  25 mg Oral BID  . omega-3 acid ethyl esters  1 g Oral Daily  . polyvinyl alcohol  1 drop Both Eyes BID  . vancomycin  125 mg Oral QID   Continuous Infusions:   LOS: 5 days    Time spent:    Zannie Cove, MD [facility] Page via [email or web address], password [email or web address] After 7PM please contact night-coverage  05/21/2018, 1:59 PM

## 2018-05-21 NOTE — Progress Notes (Signed)
Physical Therapy Treatment Patient Details Name: Chelsea Ward MRN: [id number] DOB: [birth year] Today's Date: 05/21/2018    History of Present Illness 84 yo female was admitted after having SIRS with sepsis, Flat troponin, atelectasis and pericardial effusion.  Has (+) c-diff tox screen, and will be sent to SNF if family cannot care for her.  PMHx:  DM, HTN, CKD 2    PT Comments    Pt distance limited by tachycardia and dizziness. She ambulated 40 ft in hall with RW and min guard for safety. Will continue to follow acutely to maximize pt's activity tolerance and safety with mobility.    Follow Up Recommendations  Home health PT     Equipment Recommendations  Rolling walker with 5" wheels    Recommendations for Other Services       Precautions / Restrictions Precautions Precautions: Fall Restrictions Weight Bearing Restrictions: No    Mobility  Bed Mobility Overal bed mobility: Needs Assistance Bed Mobility: Supine to Sit;Sit to Supine     Supine to sit: HOB elevated;Supervision Sit to supine: Min assist;HOB elevated   General bed mobility comments: requires increased time and cues to progress. Assist for LE management on return to supine.  Transfers Overall transfer level: Needs assistance Equipment used: Rolling walker (2 wheeled) Transfers: Sit to/from Stand Sit to Stand: Min guard         General transfer comment: min guard for safety to rise from EOB. Cues for hand placement.  Ambulation/Gait Ambulation/Gait assistance: Min guard Gait Distance (Feet): 60 Feet(5440ft, 3510ft x2 ) Assistive device: Rolling walker (2 wheeled) Gait Pattern/deviations: Step-through pattern;Decreased stride length;Wide base of support;Trunk flexed Gait velocity: reduced   General Gait Details: pt ambulated to and from bathroom for toileting and agreeable to continue ambulation in hall after seated rest break. In hall pt's HR increased from mid 110's to 151. On return to room, pt with  c/o dizziness.   Stairs             Wheelchair Mobility    Modified Rankin (Stroke Patients Only)       Balance Overall balance assessment: Needs assistance Sitting-balance support: Feet supported Sitting balance-Leahy Scale: Fair     Standing balance support: Single extremity supported;During functional activity Standing balance-Leahy Scale: Poor Standing balance comment: reliant on UE support                             Cognition Arousal/Alertness: Awake/alert Behavior During Therapy: WFL for tasks assessed/performed Overall Cognitive Status: No family/caregiver present to determine baseline cognitive functioning                                 General Comments: Pt is slow to respond and to engage  Unsure of her baseline, and if this may be due to hearing loss       Exercises      General Comments General comments (skin integrity, edema, etc.): Pt able to stand with RW to support for peri care. Unable to reach behind to wipe.      Pertinent Vitals/Pain Pain Assessment: Faces Faces Pain Scale: Hurts a little bit Pain Location: abdomen Pain Descriptors / Indicators: Discomfort;Cramping Pain Intervention(s): Monitored during session;Limited activity within patient's tolerance    Home Living                      Prior Function  PT Goals (current goals can now be found in the care plan section) Acute Rehab PT Goals Patient Stated Goal: to go home with son  PT Goal Formulation: With patient Time For Goal Achievement: 05/31/18 Potential to Achieve Goals: Good Progress towards PT goals: Progressing toward goals    Frequency    Min 3X/week      PT Plan Current plan remains appropriate    Co-evaluation              AM-PAC PT "6 Clicks" Daily Activity  Outcome Measure  Difficulty turning over in bed (including adjusting bedclothes, sheets and blankets)?: A Little Difficulty moving from lying on  back to sitting on the side of the bed? : Unable Difficulty sitting down on and standing up from a chair with arms (e.g., wheelchair, bedside commode, etc,.)?: Unable Help needed moving to and from a bed to chair (including a wheelchair)?: A Little Help needed walking in hospital room?: A Little Help needed climbing 3-5 steps with a railing? : A Little 6 Click Score: 14    End of Session Equipment Utilized During Treatment: Gait belt Activity Tolerance: Patient tolerated treatment well Patient left: in bed;with call bell/phone within reach Nurse Communication: Mobility status PT Visit Diagnosis: Unsteadiness on feet (R26.81);Other abnormalities of gait and mobility (R26.89);Muscle weakness (generalized) (M62.81);Ataxic gait (R26.0);Difficulty in walking, not elsewhere classified (R26.2);Adult, failure to thrive (R62.7)     Time: 8657-8469 PT Time Calculation (min) (ACUTE ONLY): 26 min  Charges:  $Gait Training: 8-22 mins $Therapeutic Activity: 8-22 mins                    G Codes:       Chelsea Ward, Chelsea Ward Pager [phone] Acute Rehab   Chelsea Ward 05/21/2018, 4:09 PM

## 2018-05-21 NOTE — Progress Notes (Signed)
Doctor placed order for lasix.  Provided education regarding restarting lasix with patient.  Per patient- doctor had stop lasix due to heart issue.  Paged doctor to find out if I should give lasix or not.

## 2018-05-21 NOTE — Progress Notes (Signed)
PT Cancellation Note  Patient Details Name: Chelsea Ward MRN: [id number] DOB: 08-19-1933   Cancelled Treatment:    Reason Eval/Treat Not Completed: Patient at procedure or test/unavailable. Pt in x-ray. Will check back as time allows.  Kallie LocksHannah Emmerie Battaglia, PTA Pager [phone] Acute Rehab   Sheral ApleyHannah E Sima Lindenberger 05/21/2018, 1:08 PM

## 2018-05-22 ENCOUNTER — Inpatient Hospital Stay (HOSPITAL_COMMUNITY): Payer: Medicare Other

## 2018-05-22 DIAGNOSIS — R0600 Dyspnea, unspecified: Secondary | ICD-10-CM

## 2018-05-22 LAB — BASIC METABOLIC PANEL
ANION GAP: 9 (ref 5–15)
BUN: 12 mg/dL (ref 6–20)
CALCIUM: 8.3 mg/dL — AB (ref 8.9–10.3)
CO2: 26 mmol/L (ref 22–32)
Chloride: 103 mmol/L (ref 101–111)
Creatinine, Ser: 1.11 mg/dL — ABNORMAL HIGH (ref 0.44–1.00)
GFR calc Af Amer: 52 mL/min — ABNORMAL LOW (ref >60)
GFR calc non Af Amer: 45 mL/min — ABNORMAL LOW (ref >60)
Glucose, Bld: 128 mg/dL — ABNORMAL HIGH (ref 65–99)
POTASSIUM: 3.8 mmol/L (ref 3.5–5.1)
Sodium: 138 mmol/L (ref 135–145)

## 2018-05-22 LAB — ECHOCARDIOGRAM COMPLETE
Height: 64 in
Weight: 2235.2 oz

## 2018-05-22 LAB — GLUCOSE, CAPILLARY
GLUCOSE-CAPILLARY: 173 mg/dL — AB (ref 65–99)
GLUCOSE-CAPILLARY: 172 mg/dL — AB (ref 65–99)
Glucose-Capillary: 124 mg/dL — ABNORMAL HIGH (ref 65–99)

## 2018-05-22 LAB — CBC
HEMATOCRIT: 35.8 % — AB (ref 36.0–46.0)
Hemoglobin: 11.1 g/dL — ABNORMAL LOW (ref 12.0–15.0)
MCH: 27.8 pg (ref 26.0–34.0)
MCHC: 31 g/dL (ref 30.0–36.0)
MCV: 89.5 fL (ref 78.0–100.0)
PLATELETS: 214 10*3/uL (ref 150–400)
RBC: 4 MIL/uL (ref 3.87–5.11)
RDW: 15.8 % — AB (ref 11.5–15.5)
WBC: 9.9 10*3/uL (ref 4.0–10.5)

## 2018-05-22 MED ORDER — FUROSEMIDE 20 MG PO TABS: 20.0000 mg | ORAL_TABLET | Freq: Every day | ORAL | Status: DC

## 2018-05-22 NOTE — Care Management Important Message (Signed)
Important Message  Patient Details  Name: Chelsea RabonOdessa Ward MRN: [id number] Date of Birth: [birth year]   Medicare Important Message Given:  Yes    Lawerance Sabalebbie Jood Retana, RN 05/22/2018, 9:52 AM

## 2018-05-22 NOTE — Progress Notes (Signed)
Patient still feeling dizzy/lightheaded & SOB once up and ambulating.  Standing BP 106/74, HR 100, O2 97 RA.   Had similar episode with PT.   Paged MD

## 2018-05-22 NOTE — Progress Notes (Signed)
  Echocardiogram 2D Echocardiogram has been performed.  Roosvelt MaserLane, Savina Olshefski F 05/22/2018, 3:26 PM

## 2018-05-22 NOTE — Progress Notes (Signed)
Occupational Therapy Treatment Patient Details Name: Chelsea RabonOdessa Ward MRN: [id number] DOB: 02-Dec-1933 Today's Date: 05/22/2018    History of present illness 84 yo female was admitted after having SIRS with sepsis, Flat troponin, atelectasis and pericardial effusion.  Has (+) c-diff tox screen, and will be sent to SNF if family cannot care for her.  PMHx:  DM, HTN, CKD 2   OT comments  Pt progressing towards established OT goals. Pt performing grooming at sink with close supervision for safety. Pt reporting feeling light headed and needs to use restroom. Pt requiring Min Guard-Min A for toilet transfer and Min Guard A for toilet hygiene. Pt continue to present with decreased activity tolerance as seen by fatigue and weakness. Continue to recommend dc with HHOT and will continue to follow acutely as admitted.    Follow Up Recommendations  Home health OT;Supervision/Assistance - 24 hour    Equipment Recommendations  None recommended by OT    Recommendations for Other Services      Precautions / Restrictions Precautions Precautions: Fall Restrictions Weight Bearing Restrictions: No       Mobility Bed Mobility Overal bed mobility: Needs Assistance Bed Mobility: Supine to Sit;Sit to Supine     Supine to sit: Supervision;HOB elevated Sit to supine: Supervision;HOB elevated   General bed mobility comments: supervision for safety. increased time  Transfers Overall transfer level: Needs assistance Equipment used: Rolling walker (2 wheeled) Transfers: Sit to/from Stand Sit to Stand: Supervision         General transfer comment: supervision for safety    Balance Overall balance assessment: Needs assistance Sitting-balance support: Feet supported Sitting balance-Leahy Scale: Fair     Standing balance support: Single extremity supported;During functional activity Standing balance-Leahy Scale: Fair Standing balance comment: Able to maintain standing without UE support                            ADL either performed or assessed with clinical judgement   ADL Overall ADL's : Needs assistance/impaired     Grooming: Wash/dry face;Wash/dry hands;Oral care;Set up;Supervision/safety;Standing Grooming Details (indicate cue type and reason): Pt performing grooming tasks at sink with supervision for safety. Pt requiring increased time for processing and is slow moving.                  Toilet Transfer: Minimal assistance;Regular Toilet;RW;Grab bars;Ambulation Toilet Transfer Details (indicate cue type and reason): Min A to power up into standing from toilet surface Toileting- Clothing Manipulation and Hygiene: Min guard;Sit to/from stand Toileting - Clothing Manipulation Details (indicate cue type and reason): Min Guard A for safety     Functional mobility during ADLs: Min guard;Rolling walker General ADL Comments: Pt continues to present with fatigue and generalized weakness. Pt fatiguing quickly and becoming light headed during grooming. BP after activity 120/81     Vision       Perception     Praxis      Cognition Arousal/Alertness: Awake/alert Behavior During Therapy: WFL for tasks assessed/performed Overall Cognitive Status: No family/caregiver present to determine baseline cognitive functioning                                 General Comments: Pt is slow to respond and to engage  Unsure of her baseline, and if this may be due to hearing loss         Exercises  Shoulder Instructions       General Comments RN present at begining of session. Pt back itchy and RN applying lotion.    Pertinent Vitals/ Pain       Pain Assessment: Faces Faces Pain Scale: No hurt Pain Intervention(s): Monitored during session  Home Living                                          Prior Functioning/Environment              Frequency  Min 2X/week        Progress Toward Goals  OT Goals(current goals  can now be found in the care plan section)  Progress towards OT goals: Progressing toward goals  Acute Rehab OT Goals Patient Stated Goal: to go home with son  OT Goal Formulation: With patient Time For Goal Achievement: 06/01/18 Potential to Achieve Goals: Good ADL Goals Pt Will Perform Grooming: with supervision;standing Pt Will Perform Upper Body Bathing: with set-up;with supervision;sitting Pt Will Perform Lower Body Bathing: with supervision;sit to/from stand Pt Will Perform Upper Body Dressing: with supervision;standing Pt Will Perform Lower Body Dressing: with supervision;sit to/from stand Pt Will Transfer to Toilet: with supervision;ambulating;bedside commode;grab bars Pt Will Perform Toileting - Clothing Manipulation and hygiene: sit to/from stand Pt Will Perform Tub/Shower Transfer: with min guard assist;ambulating;shower seat;rolling walker;grab bars  Plan Discharge plan remains appropriate    Co-evaluation                 AM-PAC PT "6 Clicks" Daily Activity     Outcome Measure   Help from another person eating meals?: None Help from another person taking care of personal grooming?: A Little Help from another person toileting, which includes using toliet, bedpan, or urinal?: A Little Help from another person bathing (including washing, rinsing, drying)?: A Little Help from another person to put on and taking off regular upper body clothing?: A Little Help from another person to put on and taking off regular lower body clothing?: A Little 6 Click Score: 19    End of Session Equipment Utilized During Treatment: Rolling walker  OT Visit Diagnosis: Unsteadiness on feet (R26.81)   Activity Tolerance Patient tolerated treatment well   Patient Left with call bell/phone within reach;in bed   Nurse Communication Mobility status        Time: 1033-1100 OT Time Calculation (min): 27 min  Charges: OT General Charges $OT Visit: 1 Visit OT Treatments $Self  Care/Home Management : 23-37 mins  Chelsea Ward MSOT, OTR/L Acute Rehab Pager: [phone] Office: [phone]   Chelsea Ward Chelsea Ward 05/22/2018, 1:10 PM

## 2018-05-22 NOTE — Progress Notes (Signed)
PROGRESS NOTE    Chelsea Ward  ZOX:[id number] DOB: November 01, 1933 DOA: 05/15/2018 PCP: Patient, No Pcp Per  Brief Narrative:Chelsea Ward is an 84 year old African-American female with history of type 2 diabetes, hypertension, dyslipidemia was seen in the emergency room on 5/23 with abdominal pain and diarrhea, found to have colitis discharged home on oral ciprofloxacin and Flagyl with modest clinical improvement, she completed her antibiotic course a few days ago with recurrence of diarrhea presented to the ER  with 5+ episodes of diarrhea, abdominal pain, fever, tachycardia, C. Difficile PCR toxin positive. -started PO Vancomycin, diarrhea improving -hospitalization complicated by Volume overload, tachycardia -FU ECHO  Assessment & Plan:   C. Difficile colitis -very slow improvement, continue oral vancomycin day 7 -GI pathogen panel positive for enteroaggregative Escherichia coli- clinical significance of this is unclear in the current setting, called and d/w ID Dr.Comer -likley false positive will not Rx this -continue supportive care, antiemetics -stopped Protonix -ambulate, physical therapy  Acute and chronic CHF -EF unknown, has been admitted in [place] many times for this -was volume overloaded 2 days ago, given IV Lasix 2 days -Appears euvolemic now, hold PO lasix, dizzy -Check echocardiogram, cardiac history unknown, no records in our system and very poor health literacy, son is unable to give me any information -started beta blocker -appears euvolemic now, request records from [facility] in [place]  Tachycardia -improved, tachycardic yesterday-suspect due to volume overload, known h/o CHF, added low dose BB - no chest pain, dyspnea or hypoxia -EKG with sinus tachycardia and PVCs -Chest x-ray with atx, TSH-ok, ECHO-pending -if develops, hypoxia or chest pain, needs CTA chest, will FU ECHO, if RV dilated or decraesed RV function will check CTA chest to r/o  PE  Diabetes mellitus type 2  -continuing at Linagliptin, CBG stable  Hyperlipidemia -Continue Zetia.  Chronic kidney disease stage II -stable, creatinine at baseline  DVT prophylaxis:Lovenox Code Status: full code Family Communication:son at bedside Disposition Plan: home in 1-2 days pending evaluation of CHF, improvement in tachycardia  Consultants:      Procedures:   Antimicrobials:  Antibiotics Given (last 72 hours)    Date/Time Action Medication Dose   05/19/18 1634 Given   vancomycin (VANCOCIN) 50 mg/mL oral solution 125 mg 125 mg   05/19/18 2253 Given   vancomycin (VANCOCIN) 50 mg/mL oral solution 125 mg 125 mg   05/20/18 1031 Given   vancomycin (VANCOCIN) 50 mg/mL oral solution 125 mg 125 mg   05/20/18 1306 Given   vancomycin (VANCOCIN) 50 mg/mL oral solution 125 mg 125 mg   05/20/18 1804 Given   vancomycin (VANCOCIN) 50 mg/mL oral solution 125 mg 125 mg   05/20/18 2309 Given   vancomycin (VANCOCIN) 50 mg/mL oral solution 125 mg 125 mg   05/21/18 1118 Given   vancomycin (VANCOCIN) 50 mg/mL oral solution 125 mg 125 mg   05/21/18 1407 Given   vancomycin (VANCOCIN) 50 mg/mL oral solution 125 mg 125 mg   05/21/18 1803 Given   vancomycin (VANCOCIN) 50 mg/mL oral solution 125 mg 125 mg   05/21/18 2113 Given   vancomycin (VANCOCIN) 50 mg/mL oral solution 125 mg 125 mg   05/22/18 1033 Given   vancomycin (VANCOCIN) 50 mg/mL oral solution 125 mg 125 mg   05/22/18 1536 Given   vancomycin (VANCOCIN) 50 mg/mL oral solution 125 mg 125 mg      Subjective: -dizzy this am, dyspnea better , diarrhea improving  Objective: Vitals:   05/22/18 1034 05/22/18 1054 05/22/18 1141 05/22/18  1249  BP: 104/62 120/81 92/72 106/75  Pulse: (!) 56 (!) 102 (!) 58   Resp:      Temp:   97.6 F (36.4 C)   TempSrc:   Oral   SpO2: 97%  99% 97%  Weight:      Height:        Intake/Output Summary (Last 24 hours) at 05/22/2018 1633 Last data filed at 05/22/2018 1618 Gross per 24  hour  Intake 240 ml  Output 100 ml  Net 140 ml   Filed Weights   05/20/18 0604 05/21/18 0509 05/22/18 0434  Weight: 64.8 kg (142 lb 13.7 oz) 63.6 kg (140 lb 4.8 oz) 63.4 kg (139 lb 11.2 oz)    Examination:  Gen: Awake, Alert, Oriented X 3, elderly, frail, chronically ill appearing HEENT: PERRLA, Neck supple, no JVD Lungs: decreased BS at both bases CVS: S1S2/RRR Abd: soft, Non tender, non distended, BS present Extremities: No Cyanosis, Clubbing or edema Skin: no new rashes Psychiatry: Judgement and insight appear normal. Mood & affect appropriate.     Data Reviewed:   CBC: Recent Labs  Lab 05/15/18 2327  05/16/18 0543 05/19/18 0350 05/20/18 0825 05/21/18 0453 05/22/18 0431  WBC 12.2*  --  9.3 8.0 8.3 9.2 9.9  NEUTROABS 10.6*  --  7.4  --   --   --   --   HGB 12.5   < > 11.5* 10.5* 11.2* 11.3* 11.1*  HCT 40.9   < > 36.2 34.0* 36.1 35.8* 35.8*  MCV 93.2  --  90.3 89.2 90.0 88.4 89.5  PLT 191  --  179 183 202 216 214   < > = values in this interval not displayed.   Basic Metabolic Panel: Recent Labs  Lab 05/18/18 0513 05/19/18 0350 05/20/18 0825 05/21/18 0453 05/22/18 0431  NA 140 138 140 140 138  K 4.3 4.8 4.2 4.3 3.8  CL 108 106 106 105 103  CO2 24 25 24 26 26   GLUCOSE 89 121* 123* 135* 128*  BUN 11 11 11 11 12   CREATININE 0.98 0.96 0.95 1.03* 1.11*  CALCIUM 8.0* 8.0* 8.2* 8.4* 8.3*   GFR: Estimated Creatinine Clearance: 33.2 mL/min (A) (by C-G formula based on SCr of 1.11 mg/dL (H)). Liver Function Tests: Recent Labs  Lab 05/16/18 0543  AST 30  ALT 27  ALKPHOS 25*  BILITOT 0.8  PROT 6.7  ALBUMIN 2.9*   No results for input(s): LIPASE, AMYLASE in the last 168 hours. No results for input(s): AMMONIA in the last 168 hours. Coagulation Profile: No results for input(s): INR, PROTIME in the last 168 hours. Cardiac Enzymes: Recent Labs  Lab 05/16/18 0543  TROPONINI 0.08*   BNP (last 3 results) No results for input(s): PROBNP in the last 8760  hours. HbA1C: No results for input(s): HGBA1C in the last 72 hours. CBG: Recent Labs  Lab 05/21/18 1621 05/21/18 2011 05/21/18 2321 05/22/18 0743 05/22/18 1139  GLUCAP 126* 144* 107* 124* 173*   Lipid Profile: No results for input(s): CHOL, HDL, LDLCALC, TRIG, CHOLHDL, LDLDIRECT in the last 72 hours. Thyroid Function Tests: Recent Labs    05/21/18 1405  TSH 2.472  FREET4 1.35   Anemia Panel: No results for input(s): VITAMINB12, FOLATE, FERRITIN, TIBC, IRON, RETICCTPCT in the last 72 hours. Urine analysis:    Component Value Date/Time   COLORURINE AMBER (A) 05/16/2018 0311   APPEARANCEUR HAZY (A) 05/16/2018 0311   LABSPEC 1.042 (H) 05/16/2018 0311   PHURINE 5.0 05/16/2018 1610  GLUCOSEU 50 (A) 05/16/2018 0311   HGBUR NEGATIVE 05/16/2018 0311   BILIRUBINUR NEGATIVE 05/16/2018 0311   KETONESUR 20 (A) 05/16/2018 0311   PROTEINUR 30 (A) 05/16/2018 0311   NITRITE NEGATIVE 05/16/2018 0311   LEUKOCYTESUR MODERATE (A) 05/16/2018 0311   Sepsis Labs: @LABRCNTIP (procalcitonin:4,lacticidven:4)  ) Recent Results (from the past 240 hour(s))  Culture, blood (routine x 2)     Status: None   Collection Time: 05/15/18 11:37 PM  Result Value Ref Range Status   Specimen Description BLOOD RIGHT HAND  Final   Special Requests   Final    BOTTLES DRAWN AEROBIC AND ANAEROBIC Blood Culture adequate volume   Culture   Final    NO GROWTH 5 DAYS Performed at [facility], [address] [place], [place] [address]    Report Status 05/21/2018 FINAL  Final  Culture, blood (routine x 2)     Status: None   Collection Time: 05/16/18  3:54 AM  Result Value Ref Range Status   Specimen Description BLOOD RIGHT ARM  Final   Special Requests   Final    BOTTLES DRAWN AEROBIC ONLY Blood Culture adequate volume   Culture   Final    NO GROWTH 5 DAYS Performed at [facility], [address] [place], [place] [address]    Report Status 05/21/2018 FINAL  Final  C difficile quick scan  w PCR reflex     Status: Abnormal   Collection Time: 05/16/18  9:37 AM  Result Value Ref Range Status   C Diff antigen POSITIVE (A) NEGATIVE Final   C Diff toxin POSITIVE (A) NEGATIVE Final   C Diff interpretation Toxin producing C. difficile detected.  Final    Comment: CRITICAL RESULT CALLED TO, READ BACK BY AND VERIFIED WITH: Micah FlesherK. Pelkey RN 10:45 05/16/18 ([email or web address]) Performed at [facility], [address] [place], [place] [address]   Gastrointestinal Panel by PCR , Stool     Status: Abnormal   Collection Time: 05/16/18  9:37 AM  Result Value Ref Range Status   Campylobacter species NOT DETECTED NOT DETECTED Final   Plesimonas shigelloides NOT DETECTED NOT DETECTED Final   Salmonella species NOT DETECTED NOT DETECTED Final   Yersinia enterocolitica NOT DETECTED NOT DETECTED Final   Vibrio species NOT DETECTED NOT DETECTED Final   Vibrio cholerae NOT DETECTED NOT DETECTED Final   Enteroaggregative E coli (EAEC) DETECTED (A) NOT DETECTED Final    Comment: RESULT CALLED TO, READ BACK BY AND VERIFIED WITH: GRAVWES SISON AT 1040 ON 05/18/2018 JJB    Enteropathogenic E coli (EPEC) NOT DETECTED NOT DETECTED Final   Enterotoxigenic E coli (ETEC) NOT DETECTED NOT DETECTED Final   Shiga like toxin producing E coli (STEC) NOT DETECTED NOT DETECTED Final   Shigella/Enteroinvasive E coli (EIEC) NOT DETECTED NOT DETECTED Final   Cryptosporidium NOT DETECTED NOT DETECTED Final   Cyclospora cayetanensis NOT DETECTED NOT DETECTED Final   Entamoeba histolytica NOT DETECTED NOT DETECTED Final   Giardia lamblia NOT DETECTED NOT DETECTED Final   Adenovirus F40/41 NOT DETECTED NOT DETECTED Final   Astrovirus NOT DETECTED NOT DETECTED Final   Norovirus GI/GII NOT DETECTED NOT DETECTED Final   Rotavirus A NOT DETECTED NOT DETECTED Final   Sapovirus (I, II, IV, and V) NOT DETECTED NOT DETECTED Final    Comment: Performed at [facility], [address] [place], [place] [address]          Radiology Studies: Dg Chest 2  View  Result Date: 05/21/2018 CLINICAL DATA:  Hypoxia, follow-up, diabetes mellitus, hypertension, heart disease EXAM: CHEST - 2 VIEW COMPARISON:  05/15/2018 FINDINGS: Enlargement of cardiac silhouette. Atherosclerotic calcification aorta. Mediastinal contours and pulmonary vascularity normal. LEFT basilar subsegmental atelectasis. Lungs otherwise clear. No pulmonary infiltrate, pleural effusion or pneumothorax. Bones demineralized. IMPRESSION: Enlargement of cardiac silhouette with LEFT basilar subsegmental atelectasis. Electronically Signed   By: Ulyses Southward M.D.   On: 05/21/2018 13:28        Scheduled Meds: . aspirin EC  81 mg Oral Daily  . enoxaparin (LOVENOX) injection  40 mg Subcutaneous Daily  . ezetimibe  10 mg Oral Daily  . [START ON 05/23/2018] furosemide  20 mg Oral Daily  . linagliptin  5 mg Oral Daily  . metoprolol tartrate  25 mg Oral BID  . omega-3 acid ethyl esters  1 g Oral Daily  . polyvinyl alcohol  1 drop Both Eyes BID  . vancomycin  125 mg Oral QID   Continuous Infusions:   LOS: 6 days    Time spent:    Zannie Cove, MD [facility] Page via [email or web address], password [email or web address] After 7PM please contact night-coverage  05/22/2018, 4:33 PM

## 2018-05-22 NOTE — Progress Notes (Signed)
Lia HoppingGreenlee, [name]  Jerzie Bieri, RN        # 2.  [facility] [employer] [facility] # [phone]     1. VANCOMYCIN 50 MG/ML ORAL SOLUTION 125 MG 4 X A DAY  COVER- YES  CO-PAY- $ 3.40  TIER- 4 DRUG  PRIOR APPROVAL- NO    2. VANCOCIN  COVER- NONE FORMULARY  PRIOR APPROVAL- YES # [phone] FOR EXCEPTION     PREFERRED PHARMACY : YES [facility]

## 2018-05-23 DIAGNOSIS — A0472 Enterocolitis due to Clostridium difficile, not specified as recurrent: Principal | ICD-10-CM

## 2018-05-23 DIAGNOSIS — I5023 Acute on chronic systolic (congestive) heart failure: Secondary | ICD-10-CM

## 2018-05-23 DIAGNOSIS — E119 Type 2 diabetes mellitus without complications: Secondary | ICD-10-CM

## 2018-05-23 DIAGNOSIS — R6510 Systemic inflammatory response syndrome (SIRS) of non-infectious origin without acute organ dysfunction: Secondary | ICD-10-CM

## 2018-05-23 LAB — CBC
HCT: 36.1 % (ref 36.0–46.0)
Hemoglobin: 11.4 g/dL — ABNORMAL LOW (ref 12.0–15.0)
MCH: 28.1 pg (ref 26.0–34.0)
MCHC: 31.6 g/dL (ref 30.0–36.0)
MCV: 89.1 fL (ref 78.0–100.0)
Platelets: 232 10*3/uL (ref 150–400)
RBC: 4.05 MIL/uL (ref 3.87–5.11)
RDW: 15.8 % — ABNORMAL HIGH (ref 11.5–15.5)
WBC: 9.2 10*3/uL (ref 4.0–10.5)

## 2018-05-23 LAB — BASIC METABOLIC PANEL
Anion gap: 8 (ref 5–15)
BUN: 16 mg/dL (ref 6–20)
CO2: 26 mmol/L (ref 22–32)
CREATININE: 1.33 mg/dL — AB (ref 0.44–1.00)
Calcium: 8.3 mg/dL — ABNORMAL LOW (ref 8.9–10.3)
Chloride: 104 mmol/L (ref 101–111)
GFR calc Af Amer: 42 mL/min — ABNORMAL LOW (ref >60)
GFR calc non Af Amer: 36 mL/min — ABNORMAL LOW (ref >60)
Glucose, Bld: 140 mg/dL — ABNORMAL HIGH (ref 65–99)
POTASSIUM: 3.8 mmol/L (ref 3.5–5.1)
Sodium: 138 mmol/L (ref 135–145)

## 2018-05-23 LAB — GLUCOSE, CAPILLARY: GLUCOSE-CAPILLARY: 160 mg/dL — AB (ref 65–99)

## 2018-05-23 MED ORDER — VANCOMYCIN 50 MG/ML ORAL SOLUTION: 125.0000 mg | Freq: Four times a day (QID) | ORAL | 0 refills | Status: AC

## 2018-05-23 MED ORDER — FUROSEMIDE 20 MG PO TABS: 20.0000 mg | ORAL_TABLET | Freq: Every day | ORAL | 3 refills | Status: DC

## 2018-05-23 MED ORDER — ENOXAPARIN SODIUM 30 MG/0.3ML ~~LOC~~ SOLN: 30.0000 mg | Freq: Every day | SUBCUTANEOUS | Status: DC

## 2018-05-23 MED FILL — VANCOMYCIN HCL 125 MG CAP: 125 | 5 days supply | Qty: 20 | Fill #0

## 2018-05-23 NOTE — Progress Notes (Signed)
Called pharmacy regarding pt medications are not reconciled Pharmacy stated there are several medications that require attention, paged MD  Unable to print AVS at this time  Pt family at bedside

## 2018-05-23 NOTE — Discharge Summary (Signed)
Physician Discharge Summary  Chelsea Ward ZOX:[id number] DOB: 1933-12-05 DOA: 05/15/2018  PCP: Patient, No Pcp Per  Admit date: 05/15/2018 Discharge date: 05/23/2018  Admitted From: Home  Disposition:  Home   Recommendations for Outpatient Follow-up:  1. Follow up with PCP in 1-2 weeks 2. Please obtain BMP/CBC in one week   Home Health: Yes  Equipment/Devices: Walker  Discharge Condition: Fair  CODE STATUS: FULL Diet recommendation: Cardiac  Brief/Interim Summary: Ms. Chelsea Ward is an 84 year old African-American female with history of type 2 diabetes, hypertension, dyslipidemia was seen in the emergency room on 5/23 with abdominal pain and diarrhea, found to have colitis discharged home on oral ciprofloxacin and Flagyl with modest clinical improvement, she completed her antibiotic course a few days ago with recurrence of diarrhea presented to the ER  with 5+ episodes of diarrhea, abdominal pain, fever, tachycardia, C. Difficile PCR toxin positive.      C. Difficile colitis Started on oral vancomycin.  Had slow improvement in stool pattern.  GI pathogen panel positive for enteroaggregative Escherichia coli, d/w ID Dr.Comer, likely false positive.     Acute and chronic systolic CHF Diuresed during this hospitalization.  Echo showed EF 45%.  Resumed Lasix on discharge. Beta blocker not started due to bradycardia.  Deferred ACEi until she has outpatient follow up.  Diabetes mellitus type 2  Resumed home linagliptin at discharge.  Chronic kidney disease stage II Stable, creatinine at baseline       Discharge Diagnoses:  Active Problems:   SIRS (systemic inflammatory response syndrome) (HCC)   Diarrhea   Diabetes mellitus type 2 in nonobese (HCC)   HLD (hyperlipidemia)   C. difficile colitis    Discharge Instructions  Discharge Instructions    Diet - low sodium heart healthy   Complete by:  As directed    Discharge instructions   Complete by:  As directed     From Dr. Maryfrances Bunnell: You were admitted for diarrhea, which turned out to be an infection called "Cdiff", of clostridium difficile.  This is treated with an antibiotic liquid called "vancomycin" which is taken 4 times per day.   Take vancomycin liquid 125 mg (2.5 mL) four times daily for the next 5 days then stop  Call your primary care doctor back in [place] for a follow up appointment in the next week It is important that you establish with a new primary care doctor and a new heart doctor here in [place] as soon as possible.  Resume your home Lasix tomorrow. DO NOT TAKE omeprazole, Prilosec, pantoprazole or Protonix for heart burn anymore, as these are associated with the diarrhea infection that you had.   Increase activity slowly   Complete by:  As directed      Allergies as of 05/23/2018   No Known Allergies     Medication List    STOP taking these medications   omeprazole 20 MG capsule Commonly known as:  PRILOSEC     TAKE these medications   aspirin EC 81 MG tablet Take 81 mg by mouth daily.   ezetimibe 10 MG tablet Commonly known as:  ZETIA Take 10 mg by mouth daily.   Fish Oil 500 MG Caps Take 1 capsule by mouth 2 (two) times daily.   furosemide 20 MG tablet Commonly known as:  LASIX Take 1 tablet (20 mg total) by mouth daily.   JANUVIA 100 MG tablet Generic drug:  sitaGLIPtin Take 100 mg by mouth daily.   prednisoLONE acetate 1 % ophthalmic suspension Commonly  known as:  PRED FORTE Place 1 drop into both eyes 2 (two) times daily. For ten days   vancomycin 50 mg/mL oral solution Commonly known as:  VANCOCIN Take 2.5 mLs (125 mg total) by mouth 4 (four) times daily for 5 days.      Follow-up Information    [facility]   Specialty:  Home Health Services Why:  Therapists will contact you to set up appointments.  Contact information: [address] [place] [address] [phone]        Deeann Saint, MD On 06/04/2018.    Specialty:  Family Medicine Why:  @2 :45 pm  Contact information: [address] [place] [address] [phone]          No Known Allergies  Consultations:  None   Procedures/Studies: Dg Chest 2 View  Result Date: 05/21/2018 CLINICAL DATA:  Hypoxia, follow-up, diabetes mellitus, hypertension, heart disease EXAM: CHEST - 2 VIEW COMPARISON:  05/15/2018 FINDINGS: Enlargement of cardiac silhouette. Atherosclerotic calcification aorta. Mediastinal contours and pulmonary vascularity normal. LEFT basilar subsegmental atelectasis. Lungs otherwise clear. No pulmonary infiltrate, pleural effusion or pneumothorax. Bones demineralized. IMPRESSION: Enlargement of cardiac silhouette with LEFT basilar subsegmental atelectasis. Electronically Signed   By: Ulyses Southward M.D.   On: 05/21/2018 13:28   Dg Chest 2 View  Result Date: 05/15/2018 CLINICAL DATA:  Generalized weakness. EXAM: CHEST - 2 VIEW COMPARISON:  None. FINDINGS: Mild cardiomegaly. The hila and mediastinum are unremarkable. No pulmonary nodules or masses. Mild opacity in left base is favored to be atelectasis. No suspicious infiltrate. No overt edema. IMPRESSION: Mild atelectasis in the left base. Electronically Signed   By: Gerome Sam III M.D   On: 05/15/2018 22:34   Ct Abdomen Pelvis W Contrast  Result Date: 05/16/2018 CLINICAL DATA:  Generalized weakness starting at 4-5 p.m. EXAM: CT ABDOMEN AND PELVIS WITH CONTRAST TECHNIQUE: Multidetector CT imaging of the abdomen and pelvis was performed using the standard protocol following bolus administration of intravenous contrast. CONTRAST:  OMNIPAQUE IOHEXOL 300 MG/ML  SOLN COMPARISON:  04/26/2018 FINDINGS: Lower chest: Mild cardiomegaly without pericardial effusion or thickening. Dependent atelectasis at the lung bases. Hepatobiliary: Calcifications in the right hepatic dome compatible with old granulomatous disease. No biliary dilatation or space-occupying mass. Tiny too  small to characterize hypodensity in the left hepatic lobe consistent with a cyst or hemangioma. This measures 5 mm. Gallbladder is free of stones and wall thickening. No biliary dilatation is noted. Pancreas: Normal Spleen: Normal Adrenals/Urinary Tract: Normal bilateral adrenal glands. Minimal cortical thinning and scarring of the upper poles of both kidneys. Tiny too small to characterize hypodensities within both kidneys are statistically consistent with cysts. No enhancing renal mass lesions, nephrolithiasis nor obstructive uropathy. Nondistended urinary bladder with partial opacification of the ureter presumably from excretion of IV contrast. Stomach/Bowel: Decompressed stomach. Mild fluid-filled distention of jejunal loops in the left hemiabdomen may represent a mild enteritis. No mechanical bowel obstruction is seen. Patent small bowel anastomosis at the level of the umbilicus. The colon is decompressed in appearance and there is scattered colonic diverticulosis without acute diverticulitis noted. Once again there is mild diffuse transmural thickening of the sigmoid and rectum with moderate fecal retention compatible with chronic mild proctocolitis. Vascular/Lymphatic: Moderate aortoiliac and branch vessel atherosclerosis without adenopathy. Reproductive: Status post hysterectomy. No adnexal masses. Other: No abdominal wall hernia or abnormality. No abdominopelvic ascites. Musculoskeletal: Thoracolumbar spondylosis and facet arthropathy. No aggressive osseous lesions. IMPRESSION: 1. 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 2. Cardiomegaly without active pulmonary disease. 3. Left-sided colonic diverticulosis without acute diverticulitis. 4. Hepatic granulomata at the dome of the right hepatic lobe. 5. Tiny too small to characterize hepatic and renal cysts as before. Electronically Signed   By: Tollie Eth M.D.   On: 05/16/2018 02:21   Ct Abdomen Pelvis W Contrast  Result Date:  04/26/2018 CLINICAL DATA:  Nausea and diarrhea since yesterday. EXAM: CT ABDOMEN AND PELVIS WITH CONTRAST TECHNIQUE: Multidetector CT imaging of the abdomen and pelvis was performed using the standard protocol following bolus administration of intravenous contrast. CONTRAST:  75mL OMNIPAQUE IOHEXOL 300 MG/ML  SOLN COMPARISON:  None. FINDINGS: Lower chest: Cardiomegaly with left main three-vessel coronary arteriosclerosis, more prominently involving the LAD. Heart is enlarged without pericardial effusion. Minimal thoracic aortic atherosclerosis to the extent visualized. Bibasilar dependent atelectasis is identified. Glandular nodularity of both breasts. Hepatobiliary: Granulomas of the right hepatic dome. Tiny too small to characterize hypodensity in the left hepatic lobe measuring 3 mm, statistically consistent with a cyst or hemangioma. Gallbladder is physiologically distended without secondary signs of acute cholecystitis. A few punctate hyperdensities along the dependent aspect of the gallbladder may represent tiny stones. Pancreas: Normal Spleen: Normal Adrenals/Urinary Tract: Normal bilateral adrenal glands. Minimal cortical thinning and scarring in the upper poles. Tiny too small to characterize hypodensities within both kidneys are statistically consistent with cysts. No enhancing renal mass lesions, nephrolithiasis nor obstructive uropathy. Nondistended urinary bladder, slightly thick-walled in appearance as a result. Cystitis is believed less likely. Stomach/Bowel: Transmural thickening and inflammatory change of the distal sigmoid and rectum consistent with proctocolitis. There is descending and sigmoid diverticulosis without acute diverticulitis. Increased fecal retention with noted from cecum through distal transverse colon. No bowel obstruction. Normal appendix. Decompressed stomach. Normal small bowel rotation without obstruction. Patent small bowel anastomosis in the mid abdomen. Vascular/Lymphatic:  Moderate aortoiliac and branch vessel atherosclerosis without aneurysm, dissection or significant stenosis. No adenopathy. Reproductive: Hysterectomy.  No adnexal mass. Other: No free air nor free fluid. Musculoskeletal: T7 through T11 degenerative disc disease. No acute nor aggressive osseous abnormality. Multilevel degenerative facet arthropathy of the lumbar spine from L2 through S1. IMPRESSION: 1. Transmural thickening and inflammation involving the distal sigmoid colon and rectum consistent with proctocolitis. 2. Left-sided colonic diverticulosis. Increased colonic stool burden from cecum through distal transverse colon. No bowel obstruction. 3. Cardiomegaly with coronary arteriosclerosis. 4. Hepatic granulomas at the dome. 5. Tiny too small to characterize hypodensities in the left hepatic lobe and both kidneys statistically more likely to represent cysts. Electronically Signed   By: Tollie Eth M.D.   On: 04/26/2018 17:14       Subjective: Feels better.  Still some loose BM.  No chest pain, no dyspnea with exertion.  No confusion.  No fever.  Discharge Exam: Vitals:   05/23/18 0455 05/23/18 1002  BP: 124/70 90/60  Pulse: 67   Resp: 13   Temp: 98 F (36.7 C)   SpO2: 97%    Vitals:   05/22/18 2002 05/22/18 2147 05/23/18 0455 05/23/18 1002  BP: 98/62  124/70 90/60  Pulse: 68 (!) 101 67   Resp: 18  13   Temp: 98.2 F (36.8 C)  98 F (36.7 C)   TempSrc: Oral  Oral   SpO2: 99%  97%   Weight:   63.1 kg (139 lb 1.6 oz)   Height:        General: Pt is alert, awake, not in acute distress, sitting up in bed,  conversant. Cardiovascular: RRR, S1/S2 +, no rubs, no gallops Respiratory: CTA bilaterally, no wheezing, no rhonchi Abdominal: Soft, NT, ND, bowel sounds + Extremities: no edema, no cyanosis    The results of significant diagnostics from this hospitalization (including imaging, microbiology, ancillary and laboratory) are listed below for reference.      Microbiology: Recent Results (from the past 240 hour(s))  Culture, blood (routine x 2)     Status: None   Collection Time: 05/15/18 11:37 PM  Result Value Ref Range Status   Specimen Description BLOOD RIGHT HAND  Final   Special Requests   Final    BOTTLES DRAWN AEROBIC AND ANAEROBIC Blood Culture adequate volume   Culture   Final    NO GROWTH 5 DAYS Performed at [facility], [address] [place], [place] [address]    Report Status 05/21/2018 FINAL  Final  Culture, blood (routine x 2)     Status: None   Collection Time: 05/16/18  3:54 AM  Result Value Ref Range Status   Specimen Description BLOOD RIGHT ARM  Final   Special Requests   Final    BOTTLES DRAWN AEROBIC ONLY Blood Culture adequate volume   Culture   Final    NO GROWTH 5 DAYS Performed at [facility], [address], [place], [place] [address]    Report Status 05/21/2018 FINAL  Final  C difficile quick scan w PCR reflex     Status: Abnormal   Collection Time: 05/16/18  9:37 AM  Result Value Ref Range Status   C Diff antigen POSITIVE (A) NEGATIVE Final   C Diff toxin POSITIVE (A) NEGATIVE Final   C Diff interpretation Toxin producing C. difficile detected.  Final    Comment: CRITICAL RESULT CALLED TO, READ BACK BY AND VERIFIED WITH: Micah Flesher RN 10:45 05/16/18 ([email or web address]) Performed at [facility], [address], [place], [place] [address]   Gastrointestinal Panel by PCR , Stool     Status: Abnormal   Collection Time: 05/16/18  9:37 AM  Result Value Ref Range Status   Campylobacter species NOT DETECTED NOT DETECTED Final   Plesimonas shigelloides NOT DETECTED NOT DETECTED Final   Salmonella species NOT DETECTED NOT DETECTED Final   Yersinia enterocolitica NOT DETECTED NOT DETECTED Final   Vibrio species NOT DETECTED NOT DETECTED Final   Vibrio cholerae NOT DETECTED NOT DETECTED Final   Enteroaggregative E coli (EAEC) DETECTED (A) NOT DETECTED Final    Comment: RESULT CALLED TO, READ BACK  BY AND VERIFIED WITH: GRAVWES SISON AT 1040 ON 05/18/2018 JJB    Enteropathogenic E coli (EPEC) NOT DETECTED NOT DETECTED Final   Enterotoxigenic E coli (ETEC) NOT DETECTED NOT DETECTED Final   Shiga like toxin producing E coli (STEC) NOT DETECTED NOT DETECTED Final   Shigella/Enteroinvasive E coli (EIEC) NOT DETECTED NOT DETECTED Final   Cryptosporidium NOT DETECTED NOT DETECTED Final   Cyclospora cayetanensis NOT DETECTED NOT DETECTED Final   Entamoeba histolytica NOT DETECTED NOT DETECTED Final   Giardia lamblia NOT DETECTED NOT DETECTED Final   Adenovirus F40/41 NOT DETECTED NOT DETECTED Final   Astrovirus NOT DETECTED NOT DETECTED Final   Norovirus GI/GII NOT DETECTED NOT DETECTED Final   Rotavirus A NOT DETECTED NOT DETECTED Final   Sapovirus (I, II, IV, and V) NOT DETECTED NOT DETECTED Final    Comment: Performed at [facility], [address] [place], [place] [address]     Labs: BNP (last 3 results)  No results for input(s): BNP in the last 8760 hours. Basic Metabolic Panel: Recent Labs  Lab 05/19/18 0350 05/20/18 0825 05/21/18 0453 05/22/18 0431 05/23/18 0500  NA 138 140 140 138 138  K 4.8 4.2 4.3 3.8 3.8  CL 106 106 105 103 104  CO2 25 24 26 26 26   GLUCOSE 121* 123* 135* 128* 140*  BUN 11 11 11 12 16   CREATININE 0.96 0.95 1.03* 1.11* 1.33*  CALCIUM 8.0* 8.2* 8.4* 8.3* 8.3*   Liver Function Tests: No results for input(s): AST, ALT, ALKPHOS, BILITOT, PROT, ALBUMIN in the last 168 hours. No results for input(s): LIPASE, AMYLASE in the last 168 hours. No results for input(s): AMMONIA in the last 168 hours. CBC: Recent Labs  Lab 05/19/18 0350 05/20/18 0825 05/21/18 0453 05/22/18 0431 05/23/18 0500  WBC 8.0 8.3 9.2 9.9 9.2  HGB 10.5* 11.2* 11.3* 11.1* 11.4*  HCT 34.0* 36.1 35.8* 35.8* 36.1  MCV 89.2 90.0 88.4 89.5 89.1  PLT 183 202 216 214 232   Cardiac Enzymes: No results for input(s): CKTOTAL, CKMB, CKMBINDEX, TROPONINI in the last 168  hours. BNP: Invalid input(s): POCBNP CBG: Recent Labs  Lab 05/21/18 2321 05/22/18 0743 05/22/18 1139 05/22/18 2145 05/23/18 0729  GLUCAP 107* 124* 173* 172* 160*   D-Dimer No results for input(s): DDIMER in the last 72 hours. Hgb A1c No results for input(s): HGBA1C in the last 72 hours. Lipid Profile No results for input(s): CHOL, HDL, LDLCALC, TRIG, CHOLHDL, LDLDIRECT in the last 72 hours. Thyroid function studies Recent Labs    05/21/18 1405  TSH 2.472   Anemia work up No results for input(s): VITAMINB12, FOLATE, FERRITIN, TIBC, IRON, RETICCTPCT in the last 72 hours. Urinalysis    Component Value Date/Time   COLORURINE AMBER (A) 05/16/2018 0311   APPEARANCEUR HAZY (A) 05/16/2018 0311   LABSPEC 1.042 (H) 05/16/2018 0311   PHURINE 5.0 05/16/2018 0311   GLUCOSEU 50 (A) 05/16/2018 0311   HGBUR NEGATIVE 05/16/2018 0311   BILIRUBINUR NEGATIVE 05/16/2018 0311   KETONESUR 20 (A) 05/16/2018 0311   PROTEINUR 30 (A) 05/16/2018 0311   NITRITE NEGATIVE 05/16/2018 0311   LEUKOCYTESUR MODERATE (A) 05/16/2018 0311   Sepsis Labs Invalid input(s): PROCALCITONIN,  WBC,  LACTICIDVEN Microbiology Recent Results (from the past 240 hour(s))  Culture, blood (routine x 2)     Status: None   Collection Time: 05/15/18 11:37 PM  Result Value Ref Range Status   Specimen Description BLOOD RIGHT HAND  Final   Special Requests   Final    BOTTLES DRAWN AEROBIC AND ANAEROBIC Blood Culture adequate volume   Culture   Final    NO GROWTH 5 DAYS Performed at [facility], [address], [place], [place] [address]    Report Status 05/21/2018 FINAL  Final  Culture, blood (routine x 2)     Status: None   Collection Time: 05/16/18  3:54 AM  Result Value Ref Range Status   Specimen Description BLOOD RIGHT ARM  Final   Special Requests   Final    BOTTLES DRAWN AEROBIC ONLY Blood Culture adequate volume   Culture   Final    NO GROWTH 5 DAYS Performed at [facility], [address], [place], [place] [address]    Report Status 05/21/2018 FINAL  Final  C difficile quick scan w PCR reflex     Status: Abnormal   Collection Time: 05/16/18  9:37 AM  Result Value Ref Range Status   C  Diff antigen POSITIVE (A) NEGATIVE Final   C Diff toxin POSITIVE (A) NEGATIVE Final   C Diff interpretation Toxin producing C. difficile detected.  Final    Comment: CRITICAL RESULT CALLED TO, READ BACK BY AND VERIFIED WITH: Micah FlesherK. Pelkey RN 10:45 05/16/18 ([email or web address]) Performed at [facility], [address] [place], [place] [address]   Gastrointestinal Panel by PCR , Stool     Status: Abnormal   Collection Time: 05/16/18  9:37 AM  Result Value Ref Range Status   Campylobacter species NOT DETECTED NOT DETECTED Final   Plesimonas shigelloides NOT DETECTED NOT DETECTED Final   Salmonella species NOT DETECTED NOT DETECTED Final   Yersinia enterocolitica NOT DETECTED NOT DETECTED Final   Vibrio species NOT DETECTED NOT DETECTED Final   Vibrio cholerae NOT DETECTED NOT DETECTED Final   Enteroaggregative E coli (EAEC) DETECTED (A) NOT DETECTED Final    Comment: RESULT CALLED TO, READ BACK BY AND VERIFIED WITH: GRAVWES SISON AT 1040 ON 05/18/2018 JJB    Enteropathogenic E coli (EPEC) NOT DETECTED NOT DETECTED Final   Enterotoxigenic E coli (ETEC) NOT DETECTED NOT DETECTED Final   Shiga like toxin producing E coli (STEC) NOT DETECTED NOT DETECTED Final   Shigella/Enteroinvasive E coli (EIEC) NOT DETECTED NOT DETECTED Final   Cryptosporidium NOT DETECTED NOT DETECTED Final   Cyclospora cayetanensis NOT DETECTED NOT DETECTED Final   Entamoeba histolytica NOT DETECTED NOT DETECTED Final   Giardia lamblia NOT DETECTED NOT DETECTED Final   Adenovirus F40/41 NOT DETECTED NOT DETECTED Final   Astrovirus NOT DETECTED NOT DETECTED Final   Norovirus GI/GII NOT DETECTED NOT DETECTED Final   Rotavirus A NOT DETECTED NOT DETECTED Final   Sapovirus (I, II, IV, and V) NOT DETECTED NOT DETECTED Final     Comment: Performed at [facility], [address] [place], [place] [address]     Time coordinating discharge: 45 minutes       SIGNED:   Alberteen Samhristopher P Danford, MD  [facility] 05/23/2018, 6:30 PM

## 2018-05-23 NOTE — Progress Notes (Signed)
Pt family calls states they do not have lasix at home, [facility] [address]  Paged MD to inform

## 2018-05-23 NOTE — Progress Notes (Signed)
MD aware of medications needing reconciliation  MD went to bedside to speak to pt and pt family

## 2018-05-23 NOTE — Progress Notes (Signed)
Pt discharged via wheelchair with NT, pt has all equipment and prescriptions

## 2018-05-23 NOTE — Progress Notes (Signed)
Physical Therapy Treatment Patient Details Name: Chelsea RabonOdessa Ward MRN: [id number] DOB: [birth year] Today's Date: 05/23/2018    History of Present Illness 84 yo female was admitted after having SIRS with sepsis, Flat troponin, atelectasis and pericardial effusion.  Has (+) c-diff tox screen, and will be sent to SNF if family cannot care for her.  PMHx:  DM, HTN, CKD 2    PT Comments    Patient progressing with therapy, now supervision level for all mobility. Ambulating further distances without c/o of increased dizziness. HR ranged 110-139 during visit with activity. Pt reports her son will be with her when she is home, denies any concerns for returning home after d/c.     Follow Up Recommendations  Home health PT;Supervision for mobility/OOB     Equipment Recommendations  Rolling walker with 5" wheels    Recommendations for Other Services       Precautions / Restrictions Precautions Precautions: Fall Restrictions Weight Bearing Restrictions: No    Mobility  Bed Mobility Overal bed mobility: Needs Assistance Bed Mobility: Supine to Sit;Sit to Supine     Supine to sit: Supervision;HOB elevated Sit to supine: Supervision;HOB elevated   General bed mobility comments: supervision for safety. increased time  Transfers Overall transfer level: Needs assistance Equipment used: Rolling walker (2 wheeled) Transfers: Sit to/from Stand Sit to Stand: Supervision Stand pivot transfers: Supervision       General transfer comment: supervision for safety  Ambulation/Gait Ambulation/Gait assistance: Supervision Gait Distance (Feet): 70 Feet Assistive device: Rolling walker (2 wheeled) Gait Pattern/deviations: Step-to pattern Gait velocity: reduced   General Gait Details: pt ambulating too door and and back several times with supervision, no overt LOB, HRmax 139    Stairs             Wheelchair Mobility    Modified Rankin (Stroke Patients Only)       Balance  Overall balance assessment: Needs assistance Sitting-balance support: Feet supported Sitting balance-Leahy Scale: Fair     Standing balance support: Single extremity supported;During functional activity Standing balance-Leahy Scale: Fair Standing balance comment: Able to maintain standing without UE support                            Cognition Arousal/Alertness: Awake/alert Behavior During Therapy: WFL for tasks assessed/performed Overall Cognitive Status: No family/caregiver present to determine baseline cognitive functioning                                 General Comments: Pt with disengaged affect, slow to respond. denies any concerns or needs.       Exercises      General Comments        Pertinent Vitals/Pain Pain Assessment: No/denies pain    Home Living                      Prior Function            PT Goals (current goals can now be found in the care plan section) Acute Rehab PT Goals Patient Stated Goal: to go home with son  PT Goal Formulation: With patient Time For Goal Achievement: 05/31/18 Potential to Achieve Goals: Good Progress towards PT goals: Progressing toward goals    Frequency    Min 3X/week      PT Plan Current plan remains appropriate    Co-evaluation  AM-PAC PT "6 Clicks" Daily Activity  Outcome Measure  Difficulty turning over in bed (including adjusting bedclothes, sheets and blankets)?: A Little Difficulty moving from lying on back to sitting on the side of the bed? : A Little Difficulty sitting down on and standing up from a chair with arms (e.g., wheelchair, bedside commode, etc,.)?: A Little Help needed moving to and from a bed to chair (including a wheelchair)?: A Little Help needed walking in hospital room?: A Little Help needed climbing 3-5 steps with a railing? : A Little 6 Click Score: 18    End of Session Equipment Utilized During Treatment: Gait belt Activity  Tolerance: Patient tolerated treatment well Patient left: in bed;with call bell/phone within reach Nurse Communication: Mobility status PT Visit Diagnosis: Unsteadiness on feet (R26.81);Other abnormalities of gait and mobility (R26.89);Muscle weakness (generalized) (M62.81);Ataxic gait (R26.0);Difficulty in walking, not elsewhere classified (R26.2);Adult, failure to thrive (R62.7)     Time: 5409-8119 PT Time Calculation (min) (ACUTE ONLY): 20 min  Charges:  $Gait Training: 8-22 mins                    G Codes:       Etta Grandchild, PT, DPT Acute Rehab Services Pager: [phone]     Etta Grandchild 05/23/2018, 10:32 AM

## 2018-05-23 NOTE — Progress Notes (Signed)
Patient and son provided discharge instructions including follow up appointment, new MD, medical equipment. Patient's son is primary caregiver and verbalizes understanding of all discharge instructions.

## 2018-06-04 ENCOUNTER — Encounter: Payer: Self-pay | Admitting: Family Medicine

## 2018-06-04 ENCOUNTER — Ambulatory Visit (INDEPENDENT_AMBULATORY_CARE_PROVIDER_SITE_OTHER): Payer: Medicare Other | Admitting: Family Medicine

## 2018-06-04 VITALS — BP 118/70 | HR 84 | Temp 97.8°F | Ht 64.0 in | Wt 138.0 lb

## 2018-06-04 DIAGNOSIS — R05 Cough: Secondary | ICD-10-CM | POA: Diagnosis not present

## 2018-06-04 DIAGNOSIS — K529 Noninfective gastroenteritis and colitis, unspecified: Secondary | ICD-10-CM

## 2018-06-04 DIAGNOSIS — R059 Cough, unspecified: Secondary | ICD-10-CM

## 2018-06-04 DIAGNOSIS — I509 Heart failure, unspecified: Secondary | ICD-10-CM | POA: Insufficient documentation

## 2018-06-04 DIAGNOSIS — Z7689 Persons encountering health services in other specified circumstances: Secondary | ICD-10-CM | POA: Diagnosis not present

## 2018-06-04 DIAGNOSIS — E782 Mixed hyperlipidemia: Secondary | ICD-10-CM

## 2018-06-04 DIAGNOSIS — E119 Type 2 diabetes mellitus without complications: Secondary | ICD-10-CM | POA: Diagnosis not present

## 2018-06-04 DIAGNOSIS — J302 Other seasonal allergic rhinitis: Secondary | ICD-10-CM

## 2018-06-04 MED ORDER — FEXOFENADINE HCL 60 MG PO TABS: 60.0000 mg | ORAL_TABLET | Freq: Every day | ORAL | 2 refills | Status: DC

## 2018-06-04 NOTE — Progress Notes (Signed)
Patient presents to clinic today to f/u and to establish care.  She is accompanied by her son.  SUBJECTIVE: PMH: Pt is an 84 yo female with pmh sig for CHF, DM II, HLD, recent colitis.  Pt was previously seen in [place], [place].  Colitis: -initially seen in ED on 04/26/18 for abd pain and diarrhea.  Given Cipro and Flagyl. -diarrhea stopped after 4 days, but pt developed abd pain and weakness. -Patient hospitalized from 6/11 through 6/19. -2/2 C. Diff  -Given Vanc -notes improvement in stools  CHF: -taking lasix 20 mg daily -bb not started 2/2 bradycardia in hospital. -pt denies current LE edema or SOB -ECHO 05/22/18  EF 45%  Seasonal allergies: -pt notes increased symptoms since moving to [facility]. -was taking an allergy med, but ran out -pt endorses coughing, nasal drainage  HLD: -told had high cholesterol in the past -taking zetia and omega 3 fatty acids -pt eating what ever her son gives her. Pt's son states he gives her pot pies and boost to drink.  States pt does not have much of an appetite.  DM II: -taking januvia 100 mg daily  -last hgb A1C was > 6 months ago.   Allergies: nkda   Past Medical History:  Diagnosis Date  . Diabetes mellitus without complication (HCC)   . Heart disease   . Hypercholesteremia   . Hypertension     Past Surgical History:  Procedure Laterality Date  . COLON SURGERY    . EYE SURGERY      Current Outpatient Medications on File Prior to Visit  Medication Sig Dispense Refill  . aspirin EC 81 MG tablet Take 81 mg by mouth daily.    Marland Kitchen ezetimibe (ZETIA) 10 MG tablet Take 10 mg by mouth daily.  0  . furosemide (LASIX) 20 MG tablet Take 1 tablet (20 mg total) by mouth daily. 30 tablet 3  . JANUVIA 100 MG tablet Take 100 mg by mouth daily.  2  . Omega-3 Fatty Acids (FISH OIL) 500 MG CAPS Take 1 capsule by mouth 2 (two) times daily.    . prednisoLONE acetate (PRED FORTE) 1 % ophthalmic suspension Place 1 drop into both eyes 2 (two)  times daily. For ten days  5   No current facility-administered medications on file prior to visit.     No Known Allergies  Family History  Problem Relation Age of Onset  . Diabetes Mellitus II Son   . Arthritis Mother   . Hearing loss Mother   . Arthritis Father   . Heart attack Father   . Arthritis Sister   . Heart attack Sister     Social History   Socioeconomic History  . Marital status: Widowed    Spouse name: Not on file  . Number of children: Not on file  . Years of education: Not on file  . Highest education level: Not on file  Occupational History  . Not on file  Social Needs  . Financial resource strain: Not on file  . Food insecurity:    Worry: Not on file    Inability: Not on file  . Transportation needs:    Medical: Not on file    Non-medical: Not on file  Tobacco Use  . Smoking status: Never Smoker  . Smokeless tobacco: Never Used  Substance and Sexual Activity  . Alcohol use: No  . Drug use: Not on file  . Sexual activity: Not on file  Lifestyle  . Physical activity:  Days per week: Not on file    Minutes per session: Not on file  . Stress: Not on file  Relationships  . Social connections:    Talks on phone: Not on file    Gets together: Not on file    Attends religious service: Not on file    Active member of club or organization: Not on file    Attends meetings of clubs or organizations: Not on file    Relationship status: Not on file  . Intimate partner violence:    Fear of current or ex partner: Not on file    Emotionally abused: Not on file    Physically abused: Not on file    Forced sexual activity: Not on file  Other Topics Concern  . Not on file  Social History Narrative  . Not on file    ROS General: Denies fever, chills, night sweats, changes in weight, changes in appetite HEENT: Denies headaches, ear pain, changes in vision, rhinorrhea, sore throat CV: Denies CP, palpitations, SOB, orthopnea Pulm: Denies SOB, wheezing   +cough GI: Denies abdominal pain, nausea, vomiting, diarrhea, constipation GU: Denies dysuria, hematuria, frequency, vaginal discharge Msk: Denies muscle cramps, joint pains Neuro: Denies weakness, numbness, tingling Skin: Denies rashes, bruising Psych: Denies depression, anxiety, hallucinations  BP 118/70 (BP Location: Left Arm, Patient Position: Sitting, Cuff Size: Normal)   Pulse 84   Temp 97.8 F (36.6 C) (Oral)   Ht 5\' 4"  (1.626 m)   Wt 138 lb (62.6 kg)   SpO2 97%   BMI 23.69 kg/m   Physical Exam Gen. Pleasant, well developed, well-nourished, in NAD HEENT - [place]/AT, PERRL, no scleral icterus, no nasal drainage, pharynx without erythema or exudate.  TMs normal bilaterally.  No cervical lymphadenopathy. Lungs: no use of accessory muscles, CTAB, no wheezes, rales or rhonchi Cardiovascular: RRR, No r/g/m, no peripheral edema Abdomen: BS present, soft, nontender, nondistended Neuro:  A&Ox3, CN II-XII intact, ambulating with a cane Skin:  Warm, dry, intact, no lesions  Assessment/Plan: Cough  - Plan: fexofenadine (ALLEGRA) 60 MG tablet  Seasonal allergies  - Plan: fexofenadine (ALLEGRA) 60 MG tablet  Congestive heart failure, unspecified HF chronicity, unspecified heart failure type (HCC) -continue lasix 20 mg daily  Mixed hyperlipidemia -continue omega 3 fatty acids and zetia  Controlled type 2 diabetes mellitus without complication, without long-term current use of insulin (HCC)  -continue januvia 100 mg daily -discussed eating healthy options - Plan: Hemoglobin A1c  Encounter to establish care -We reviewed the PMH, PSH, FH, SH, Meds and Allergies. -We provided refills for any medications we will prescribe as needed. -We addressed current concerns per orders and patient instructions. -We have asked for records for pertinent exams, studies, vaccines and notes from previous providers. -We have advised patient to follow up per instructions below.  Colitis  - Plan: CBC  (no diff), Basic metabolic panel  F/u in 1 month, sooner if needed  Abbe AmsterdamShannon Herron Fero, MD

## 2018-06-04 NOTE — Patient Instructions (Addendum)
How to Avoid Diabetes Mellitus Problems You can take action to prevent or slow down problems that are caused by diabetes (diabetes mellitus). Following your diabetes plan and taking care of yourself can reduce your risk of serious or life-threatening complications. Manage your diabetes  Follow instructions from your health care providers about managing your diabetes. Your diabetes may be managed by a team of health care providers who can teach you how to care for yourself and can answer questions that you have.  Educate yourself about your condition so you can make healthy choices about eating and physical activity.  Check your blood sugar (glucose) levels as often as directed. Your health care provider will help you decide how often to check your blood glucose level depending on your treatment goals and how well you are meeting them.  Ask your health care provider if you should take low-dose aspirin daily and what dose is recommended for you. Taking low-dose aspirin daily is recommended to help prevent cardiovascular disease. Do not use nicotine or tobacco Do not use any products that contain nicotine or tobacco, such as cigarettes and e-cigarettes. If you need help quitting, ask your health care provider. Nicotine raises your risk for diabetes problems. If you quit using nicotine:  You will lower your risk for heart attack, stroke, nerve disease, and kidney disease.  Your cholesterol and blood pressure may improve.  Your blood circulation will improve.  Keep your blood pressure under control To control your blood pressure:  Follow instructions from your health care provider about meal planning, exercise, and medicines.  Make sure your health care provider checks your blood pressure at every medical visit.  A blood pressure reading consists of two numbers. Generally, the goal is to keep your top number (systolic pressure) at or below 130, and your bottom number (diastolic pressure) at or  below 80. Your health care provider may recommend a lower target blood pressure. Your individualized target blood pressure is determined based on:  Your age.  Your medicines.  How long you have had diabetes.  Any other medical conditions you have.  Keep your cholesterol under control To control your cholesterol:  Follow instructions from your health care provider about meal planning, exercise, and medicines.  Have your cholesterol checked at least once a year.  You may be prescribed medicine to lower cholesterol (statin). If you are not taking a statin, ask your health care provider if you should be.  Controlling your cholesterol may:  Help prevent heart disease and stroke. These are the most common health problems for people with diabetes.  Improve your blood flow.  Schedule and keep yearly physical exams and eye exams Your health care provider will tell you how often you need medical visits depending on your diabetes management plan. Keep all follow-up visits as directed. This is important so possible problems can be identified early and complications can be avoided or treated.  Every visit with your health care provider should include measuring your: ? Weight. ? Blood pressure. ? Blood glucose control.  Your A1c (hemoglobin A1c) level should be checked: ? At least 2 times a year, if you are meeting your treatment goals. ? 4 times a year, if you are not meeting treatment goals or if your treatment goals have changed.  Your blood lipids (lipid profile) should be checked yearly. You should also be checked yearly for protein in your urine (urine microalbumin).  If you have type 1 diabetes, get an eye exam 3-5 years after you  are diagnosed, and then once a year after your first exam.  If you have type 2 diabetes, get an eye exam as soon as you are diagnosed, and then once a year after your first exam.  Keep your vaccines current It is recommended that you receive:  A flu  (influenza) vaccine every year.  A pneumonia (pneumococcal) vaccine and a hepatitis B vaccine. If you are age 6 or older, you may get the pneumonia vaccine as a series of two separate shots.  Ask your health care provider which other vaccines may be recommended. Take care of your feet Diabetes may cause you to have poor blood circulation to your legs and feet. Because of this, taking care of your feet is very important. Diabetes can cause:  The skin on the feet to get thinner, break more easily, and heal more slowly.  Nerve damage in your legs and feet, which results in decreased feeling. You may not notice minor injuries that could lead to serious problems.  To avoid foot problems:  Check your skin and feet every day for cuts, bruises, redness, blisters, or sores.  Schedule a foot exam with your health care provider once every year. This exam includes: ? Inspecting of the structure and skin of your feet. ? Checking the pulses and sensation in your feet.  Make sure that your health care provider performs a visual foot exam at every medical visit.  Take care of your teeth People with poorly controlled diabetes are more likely to have gum (periodontal) disease. Diabetes can make periodontal diseases harder to control. If not treated, periodontal diseases can lead to tooth loss. To prevent this:  Brush your teeth twice a day.  Floss at least once a day.  Visit your dentist 2 times a year.  Drink responsibly Limit alcohol intake to no more than 1 drink a day for nonpregnant women and 2 drinks a day for men. One drink equals 12 oz of beer, 5 oz of wine, or 1 oz of hard liquor. It is important to eat food when you drink alcohol to avoid low blood glucose (hypoglycemia). Avoid alcohol if you:  Have a history of alcohol abuse or dependence.  Are pregnant.  Have liver disease, pancreatitis, advanced neuropathy, or severe hypertriglyceridemia.  Lessen stress Living with diabetes can  be stressful. When you are experiencing stress, your blood glucose may be affected in two ways:  Stress hormones may cause your blood glucose to rise.  You may be distracted from taking good care of yourself.  Be aware of your stress level and make changes to help you manage challenging situations. To lower your stress levels:  Consider joining a support group.  Do planned relaxation or meditation.  Do a hobby that you enjoy.  Maintain healthy relationships.  Exercise regularly.  Work with your health care provider or a mental health professional.  Summary  You can take action to prevent or slow down problems that are caused by diabetes (diabetes mellitus). Following your diabetes plan and taking care of yourself can reduce your risk of serious or life-threatening complications.  Follow instructions from your health care providers about managing your diabetes. Your diabetes may be managed by a team of health care providers who can teach you how to care for yourself and can answer questions that you have.  Your health care provider will tell you how often you need medical visits depending on your diabetes management plan. Keep all follow-up visits as directed. This is important  so possible problems can be identified early and complications can be avoided or treated. This information is not intended to replace advice given to you by your health care provider. Make sure you discuss any questions you have with your health care provider. Document Released: 08/09/2011 Document Revised: 08/20/2016 Document Reviewed: 08/20/2016 Elsevier Interactive Patient Education  2018 Elsevier Inc.  Heart Failure Heart failure means your heart has trouble pumping blood. This makes it hard for your body to work well. Heart failure is usually a long-term (chronic) condition. You must take good care of yourself and follow your doctor's treatment plan. Follow these instructions at home:  Take your heart  medicine as told by your doctor. ? Do not stop taking medicine unless your doctor tells you to. ? Do not skip any dose of medicine. ? Refill your medicines before they run out. ? Take other medicines only as told by your doctor or pharmacist.  Stay active if told by your doctor. The elderly and people with severe heart failure should talk with a doctor about physical activity.  Eat heart-healthy foods. Choose foods that are without trans fat and are low in saturated fat, cholesterol, and salt (sodium). This includes fresh or frozen fruits and vegetables, fish, lean meats, fat-free or low-fat dairy foods, whole grains, and high-fiber foods. Lentils and dried peas and beans (legumes) are also good choices.  Limit salt if told by your doctor.  Cook in a healthy way. Roast, grill, broil, bake, poach, steam, or stir-fry foods.  Limit fluids as told by your doctor.  Weigh yourself every morning. Do this after you pee (urinate) and before you eat breakfast. Write down your weight to give to your doctor.  Take your blood pressure and write it down if your doctor tells you to.  Ask your doctor how to check your pulse. Check your pulse as told.  Lose weight if told by your doctor.  Stop smoking or chewing tobacco. Do not use gum or patches that help you quit without your doctor's approval.  Schedule and go to doctor visits as told.  Nonpregnant women should have no more than 1 drink a day. Men should have no more than 2 drinks a day. Talk to your doctor about drinking alcohol.  Stop illegal drug use.  Stay current with shots (immunizations).  Manage your health conditions as told by your doctor.  Learn to manage your stress.  Rest when you are tired.  If it is really hot outside: ? Avoid intense activities. ? Use air conditioning or fans, or get in a cooler place. ? Avoid caffeine and alcohol. ? Wear loose-fitting, lightweight, and light-colored clothing.  If it is really cold  outside: ? Avoid intense activities. ? Layer your clothing. ? Wear mittens or gloves, a hat, and a scarf when going outside. ? Avoid alcohol.  Learn about heart failure and get support as needed.  Get help to maintain or improve your quality of life and your ability to care for yourself as needed. Contact a doctor if:  You gain weight quickly.  You are more short of breath than usual.  You cannot do your normal activities.  You tire easily.  You cough more than normal, especially with activity.  You have any or more puffiness (swelling) in areas such as your hands, feet, ankles, or belly (abdomen).  You cannot sleep because it is hard to breathe.  You feel like your heart is beating fast (palpitations).  You get dizzy or light-headed  when you stand up. Get help right away if:  You have trouble breathing.  There is a change in mental status, such as becoming less alert or not being able to focus.  You have chest pain or discomfort.  You faint. This information is not intended to replace advice given to you by your health care provider. Make sure you discuss any questions you have with your health care provider. Document Released: 08/30/2008 Document Revised: 04/28/2016 Document Reviewed: 01/07/2013 Elsevier Interactive Patient Education  2017 [employer]  Allergies An allergy is when your body reacts to a substance in a way that is not normal. An allergic reaction can happen after you:  Eat something.  Breathe in something.  Touch something.  You can be allergic to:  Things that are only around during certain seasons, like molds and pollens.  Foods.  Drugs.  Insects.  Animal dander.  What are the signs or symptoms?  Puffiness (swelling). This may happen on the lips, face, tongue, mouth, or throat.  Sneezing.  Coughing.  Breathing loudly (wheezing).  Stuffy nose.  Tingling in the mouth.  A rash.  Itching.  Itchy, red, puffy areas of  skin (hives).  Watery eyes.  Throwing up (vomiting).  Watery poop (diarrhea).  Dizziness.  Feeling faint or fainting.  Trouble breathing or swallowing.  A tight feeling in the chest.  A fast heartbeat. How is this diagnosed? Allergies can be diagnosed with:  A medical and family history.  Skin tests.  Blood tests.  A food diary. A food diary is a record of all the foods, drinks, and symptoms you have each day.  The results of an elimination diet. This diet involves making sure not to eat certain foods and then seeing what happens when you start eating them again.  How is this treated? There is no cure for allergies, but allergic reactions can be treated with medicine. Severe reactions usually need to be treated at a hospital. How is this prevented? The best way to prevent an allergic reaction is to avoid the thing you are allergic to. Allergy shots and medicines can also help prevent reactions in some cases. This information is not intended to replace advice given to you by your health care provider. Make sure you discuss any questions you have with your health care provider. Document Released: 03/18/2013 Document Revised: 07/18/2016 Document Reviewed: 09/02/2014 Elsevier Interactive Patient Education  [employer]2018 Elsevier Inc.

## 2018-06-05 LAB — BASIC METABOLIC PANEL
BUN: 15 mg/dL (ref 6–23)
CHLORIDE: 99 meq/L (ref 96–112)
CO2: 26 meq/L (ref 19–32)
CREATININE: 1.06 mg/dL (ref 0.40–1.20)
Calcium: 9.3 mg/dL (ref 8.4–10.5)
GFR: 63.6 mL/min (ref >60.00)
Glucose, Bld: 116 mg/dL — ABNORMAL HIGH (ref 70–99)
Potassium: 4 mEq/L (ref 3.5–5.1)
SODIUM: 138 meq/L (ref 135–145)

## 2018-06-05 LAB — CBC
HCT: 36.2 % (ref 36.0–46.0)
HEMOGLOBIN: 11.7 g/dL — AB (ref 12.0–15.0)
MCHC: 32.5 g/dL (ref 30.0–36.0)
MCV: 89.5 fl (ref 78.0–100.0)
Platelets: 208 10*3/uL (ref 150.0–400.0)
RBC: 4.04 Mil/uL (ref 3.87–5.11)
RDW: 16.2 % — AB (ref 11.5–15.5)
WBC: 8 10*3/uL (ref 4.0–10.5)

## 2018-06-05 LAB — HEMOGLOBIN A1C: HEMOGLOBIN A1C: 7.2 % — AB (ref 4.6–6.5)

## 2018-06-09 ENCOUNTER — Encounter (HOSPITAL_COMMUNITY): Payer: Self-pay

## 2018-06-09 ENCOUNTER — Emergency Department (HOSPITAL_COMMUNITY)
Admission: EM | Admit: 2018-06-09 | Discharge: 2018-06-09 | Disposition: A | Discharge status: Home / Self Care | Payer: Medicare Other | Source: Home / Self Care | Attending: Emergency Medicine | Admitting: Emergency Medicine

## 2018-06-09 DIAGNOSIS — I110 Hypertensive heart disease with heart failure: Secondary | ICD-10-CM

## 2018-06-09 DIAGNOSIS — I509 Heart failure, unspecified: Secondary | ICD-10-CM

## 2018-06-09 DIAGNOSIS — R1084 Generalized abdominal pain: Secondary | ICD-10-CM | POA: Insufficient documentation

## 2018-06-09 DIAGNOSIS — Z7982 Long term (current) use of aspirin: Secondary | ICD-10-CM | POA: Insufficient documentation

## 2018-06-09 DIAGNOSIS — R531 Weakness: Secondary | ICD-10-CM | POA: Diagnosis not present

## 2018-06-09 DIAGNOSIS — A0472 Enterocolitis due to Clostridium difficile, not specified as recurrent: Secondary | ICD-10-CM | POA: Diagnosis not present

## 2018-06-09 DIAGNOSIS — Z7984 Long term (current) use of oral hypoglycemic drugs: Secondary | ICD-10-CM

## 2018-06-09 DIAGNOSIS — R197 Diarrhea, unspecified: Secondary | ICD-10-CM

## 2018-06-09 DIAGNOSIS — E119 Type 2 diabetes mellitus without complications: Secondary | ICD-10-CM

## 2018-06-09 LAB — COMPREHENSIVE METABOLIC PANEL
ALBUMIN: 3.4 g/dL — AB (ref 3.5–5.0)
ALK PHOS: 42 U/L (ref 38–126)
ALT: 36 U/L (ref 0–44)
AST: 24 U/L (ref 15–41)
Anion gap: 13 (ref 5–15)
BILIRUBIN TOTAL: 0.9 mg/dL (ref 0.3–1.2)
BUN: 9 mg/dL (ref 8–23)
CO2: 22 mmol/L (ref 22–32)
Calcium: 9 mg/dL (ref 8.9–10.3)
Chloride: 103 mmol/L (ref 98–111)
Creatinine, Ser: 1.14 mg/dL — ABNORMAL HIGH (ref 0.44–1.00)
GFR calc Af Amer: 50 mL/min — ABNORMAL LOW (ref >60)
GFR calc non Af Amer: 43 mL/min — ABNORMAL LOW (ref >60)
Glucose, Bld: 122 mg/dL — ABNORMAL HIGH (ref 70–99)
POTASSIUM: 3.2 mmol/L — AB (ref 3.5–5.1)
SODIUM: 138 mmol/L (ref 135–145)
TOTAL PROTEIN: 7.9 g/dL (ref 6.5–8.1)

## 2018-06-09 LAB — URINALYSIS, ROUTINE W REFLEX MICROSCOPIC
BILIRUBIN URINE: NEGATIVE
GLUCOSE, UA: NEGATIVE mg/dL
HGB URINE DIPSTICK: NEGATIVE
KETONES UR: 5 mg/dL — AB
Leukocytes, UA: NEGATIVE
NITRITE: NEGATIVE
PH: 5 (ref 5.0–8.0)
Protein, ur: NEGATIVE mg/dL
SPECIFIC GRAVITY, URINE: 1.015 (ref 1.005–1.030)

## 2018-06-09 LAB — CBC
HEMATOCRIT: 39.8 % (ref 36.0–46.0)
Hemoglobin: 12.2 g/dL (ref 12.0–15.0)
MCH: 28 pg (ref 26.0–34.0)
MCHC: 30.7 g/dL (ref 30.0–36.0)
MCV: 91.5 fL (ref 78.0–100.0)
Platelets: 214 10*3/uL (ref 150–400)
RBC: 4.35 MIL/uL (ref 3.87–5.11)
RDW: 14.9 % (ref 11.5–15.5)
WBC: 11.6 10*3/uL — ABNORMAL HIGH (ref 4.0–10.5)

## 2018-06-09 LAB — LIPASE, BLOOD: Lipase: 30 U/L (ref 11–51)

## 2018-06-09 MED ORDER — SODIUM CHLORIDE 0.9 % IV BOLUS
500.0000 mL | Freq: Once | INTRAVENOUS | Status: AC
  Administered 2018-06-09: 500 mL via INTRAVENOUS

## 2018-06-09 NOTE — ED Notes (Signed)
Report given to oncoming RN.

## 2018-06-09 NOTE — ED Notes (Signed)
Attempted IV access multiple times - unsuccessful

## 2018-06-09 NOTE — Discharge Instructions (Addendum)
Test showed no life-threatening condition.  Follow-up with your primary care doctor. °

## 2018-06-09 NOTE — ED Notes (Signed)
Patient verbalizes understanding of discharge instructions. Opportunity for questioning and answers were provided. Armband removed by staff, pt discharged from ED in wheelchair.  

## 2018-06-09 NOTE — ED Triage Notes (Addendum)
Pt presents with 1 week h/o generalized abdominal pain and diarrhea.  Pt was admitted last month with c diff which son said had cleared before she was discharged.  Son also reports pt has no appetite and that pt's stool is black.

## 2018-06-10 NOTE — ED Provider Notes (Signed)
[facility] Provider Note   CSN: [id number] Arrival date & time: 06/09/18  1412     History   Chief Complaint No chief complaint on file.   HPI Chelsea Ward is a 84 y.o. female.  Level 5 caveat for mild dementia.  Patient reports abdominal cramping and diarrhea for several days.  Patient was admitted to the hospital approximately 1 month ago with C. difficile and placed on vancomycin.  She is no longer taking the antibiotic.  Bowel movements are loose and dark in color.  She is ambulatory and eating.  No fever, sweats, chills, chest pain, dyspnea.  Severity is mild.  Nothing makes symptoms     Past Medical History:  Diagnosis Date  . Diabetes mellitus without complication (HCC)   . Heart disease   . Hypercholesteremia   . Hypertension     Patient Active Problem List   Diagnosis Date Noted  . Seasonal allergies 06/04/2018  . Controlled type 2 diabetes mellitus without complication, without long-term current use of insulin (HCC) 06/04/2018  . Congestive heart failure (HCC) 06/04/2018  . C. difficile colitis 05/17/2018  . SIRS (systemic inflammatory response syndrome) (HCC) 05/16/2018  . Diarrhea 05/16/2018  . Diabetes mellitus type 2 in nonobese (HCC) 05/16/2018  . HLD (hyperlipidemia) 05/16/2018    Past Surgical History:  Procedure Laterality Date  . COLON SURGERY    . EYE SURGERY       OB History   None      Home Medications    Prior to Admission medications   Medication Sig Start Date End Date Taking? Authorizing Provider  aspirin EC 81 MG tablet Take 81 mg by mouth daily.    [provider]  ezetimibe (ZETIA) 10 MG tablet Take 10 mg by mouth daily. 01/29/18   [provider]  fexofenadine (ALLEGRA) 60 MG tablet Take 1 tablet (60 mg total) by mouth daily. 06/04/18   Deeann SaintBanks, Shannon R, MD  furosemide (LASIX) 20 MG tablet Take 1 tablet (20 mg total) by mouth daily. 05/23/18   Danford, Earl Liteshristopher P, MD    JANUVIA 100 MG tablet Take 100 mg by mouth daily. 03/02/18   [provider]  Omega-3 Fatty Acids (FISH OIL) 500 MG CAPS Take 1 capsule by mouth 2 (two) times daily.    [provider]  prednisoLONE acetate (PRED FORTE) 1 % ophthalmic suspension Place 1 drop into both eyes 2 (two) times daily. For ten days 05/10/18   [provider]    Family History Family History  Problem Relation Age of Onset  . Diabetes Mellitus II Son   . Arthritis Mother   . Hearing loss Mother   . Arthritis Father   . Heart attack Father   . Arthritis Sister   . Heart attack Sister     Social History Social History   Tobacco Use  . Smoking status: Never Smoker  . Smokeless tobacco: Never Used  Substance Use Topics  . Alcohol use: No  . Drug use: Not on file     Allergies   Patient has no known allergies.   Review of Systems Review of Systems  All other systems reviewed and are negative.    Physical Exam Updated Vital Signs BP 140/84   Pulse 77   Temp 98.5 F (36.9 C) (Oral)   Resp 19   Ht 5\' 4"  (1.626 m)   Wt 63.5 kg (140 lb)   SpO2 98%   BMI 24.03 kg/m  Physical Exam  Constitutional: She is oriented to person, place, and time. She appears well-developed and well-nourished.  Nad; well-hydrated.  HENT:  Head: Normocephalic and atraumatic.  Eyes: Conjunctivae are normal.  Neck: Neck supple.  Cardiovascular: Normal rate and regular rhythm.  Pulmonary/Chest: Effort normal and breath sounds normal.  Abdominal: Soft. Bowel sounds are normal.  Musculoskeletal: Normal range of motion.  Neurological: She is alert and oriented to person, place, and time.  Skin: Skin is warm and dry.  Psychiatric: She has a normal mood and affect. Her behavior is normal.  Nursing note and vitals reviewed.    ED Treatments / Results  Labs (all labs ordered are listed, but only abnormal results are displayed) Labs Reviewed  COMPREHENSIVE METABOLIC PANEL - Abnormal; Notable  for the following components:      Result Value   Potassium 3.2 (*)    Glucose, Bld 122 (*)    Creatinine, Ser 1.14 (*)    Albumin 3.4 (*)    GFR calc non Af Amer 43 (*)    GFR calc Af Amer 50 (*)    All other components within normal limits  CBC - Abnormal; Notable for the following components:   WBC 11.6 (*)    All other components within normal limits  URINALYSIS, ROUTINE W REFLEX MICROSCOPIC - Abnormal; Notable for the following components:   Ketones, ur 5 (*)    All other components within normal limits  LIPASE, BLOOD    EKG None  Radiology No results found.  Procedures Procedures (including critical care time)  Medications Ordered in ED Medications  sodium chloride 0.9 % bolus 500 mL (0 mLs Intravenous Stopped 06/09/18 2219)     Initial Impression / Assessment and Plan / ED Course  I have reviewed the triage vital signs and the nursing notes.  Pertinent labs & imaging results that were available during my care of the patient were reviewed by me and considered in my medical decision making (see chart for details).     Patient presents with abdominal cramping and diarrhea.  She was observed in the emergency department for 3 to 4 hours with 0 episodes of diarrhea.  Her labs are reassuring.  Small amount of ketones in urine.  She was given IV fluids.  She will follow-up with her primary care doctor.  Hemodynamically stable at discharge.  Final Clinical Impressions(s) / ED Diagnoses   Final diagnoses:  Diarrhea, unspecified type    ED Discharge Orders    None       Donnetta Hutching, MD 06/10/18 1649

## 2018-06-12 ENCOUNTER — Inpatient Hospital Stay: Payer: Medicare Other | Admitting: Family Medicine

## 2018-06-12 ENCOUNTER — Emergency Department (HOSPITAL_COMMUNITY): Payer: Medicare Other

## 2018-06-12 ENCOUNTER — Other Ambulatory Visit: Payer: Self-pay

## 2018-06-12 ENCOUNTER — Encounter (HOSPITAL_COMMUNITY): Payer: Self-pay

## 2018-06-12 ENCOUNTER — Inpatient Hospital Stay (HOSPITAL_COMMUNITY)
Admission: EM | Admit: 2018-06-12 | Discharge: 2018-06-21 | DRG: 371 | Disposition: A | Discharge status: Home Health | Payer: Medicare Other | Attending: Family Medicine | Admitting: Family Medicine

## 2018-06-12 DIAGNOSIS — I5043 Acute on chronic combined systolic (congestive) and diastolic (congestive) heart failure: Secondary | ICD-10-CM

## 2018-06-12 DIAGNOSIS — E785 Hyperlipidemia, unspecified: Secondary | ICD-10-CM | POA: Diagnosis present

## 2018-06-12 DIAGNOSIS — R531 Weakness: Secondary | ICD-10-CM | POA: Diagnosis present

## 2018-06-12 DIAGNOSIS — I4891 Unspecified atrial fibrillation: Secondary | ICD-10-CM | POA: Diagnosis not present

## 2018-06-12 DIAGNOSIS — E1165 Type 2 diabetes mellitus with hyperglycemia: Secondary | ICD-10-CM | POA: Diagnosis present

## 2018-06-12 DIAGNOSIS — Z833 Family history of diabetes mellitus: Secondary | ICD-10-CM | POA: Diagnosis not present

## 2018-06-12 DIAGNOSIS — A09 Infectious gastroenteritis and colitis, unspecified: Secondary | ICD-10-CM | POA: Diagnosis not present

## 2018-06-12 DIAGNOSIS — E876 Hypokalemia: Secondary | ICD-10-CM | POA: Diagnosis present

## 2018-06-12 DIAGNOSIS — E7800 Pure hypercholesterolemia, unspecified: Secondary | ICD-10-CM | POA: Diagnosis present

## 2018-06-12 DIAGNOSIS — I480 Paroxysmal atrial fibrillation: Secondary | ICD-10-CM | POA: Diagnosis present

## 2018-06-12 DIAGNOSIS — A0472 Enterocolitis due to Clostridium difficile, not specified as recurrent: Secondary | ICD-10-CM | POA: Diagnosis present

## 2018-06-12 DIAGNOSIS — R197 Diarrhea, unspecified: Secondary | ICD-10-CM | POA: Diagnosis present

## 2018-06-12 DIAGNOSIS — N183 Chronic kidney disease, stage 3 (moderate): Secondary | ICD-10-CM | POA: Diagnosis present

## 2018-06-12 DIAGNOSIS — I130 Hypertensive heart and chronic kidney disease with heart failure and stage 1 through stage 4 chronic kidney disease, or unspecified chronic kidney disease: Secondary | ICD-10-CM | POA: Diagnosis present

## 2018-06-12 DIAGNOSIS — I493 Ventricular premature depolarization: Secondary | ICD-10-CM | POA: Diagnosis present

## 2018-06-12 DIAGNOSIS — I2510 Atherosclerotic heart disease of native coronary artery without angina pectoris: Secondary | ICD-10-CM | POA: Diagnosis present

## 2018-06-12 DIAGNOSIS — Z7982 Long term (current) use of aspirin: Secondary | ICD-10-CM

## 2018-06-12 DIAGNOSIS — I5023 Acute on chronic systolic (congestive) heart failure: Secondary | ICD-10-CM | POA: Diagnosis present

## 2018-06-12 DIAGNOSIS — L8990 Pressure ulcer of unspecified site, unspecified stage: Secondary | ICD-10-CM

## 2018-06-12 DIAGNOSIS — E1122 Type 2 diabetes mellitus with diabetic chronic kidney disease: Secondary | ICD-10-CM | POA: Diagnosis present

## 2018-06-12 DIAGNOSIS — E119 Type 2 diabetes mellitus without complications: Secondary | ICD-10-CM | POA: Diagnosis not present

## 2018-06-12 DIAGNOSIS — R739 Hyperglycemia, unspecified: Secondary | ICD-10-CM

## 2018-06-12 DIAGNOSIS — Z7984 Long term (current) use of oral hypoglycemic drugs: Secondary | ICD-10-CM | POA: Diagnosis not present

## 2018-06-12 DIAGNOSIS — R748 Abnormal levels of other serum enzymes: Secondary | ICD-10-CM | POA: Diagnosis not present

## 2018-06-12 HISTORY — DX: Unspecified atrial fibrillation: I48.91

## 2018-06-12 HISTORY — DX: Unspecified osteoarthritis, unspecified site: M19.90

## 2018-06-12 HISTORY — DX: Type 2 diabetes mellitus without complications: E11.9

## 2018-06-12 HISTORY — DX: Heart failure, unspecified: I50.9

## 2018-06-12 HISTORY — DX: Hyperlipidemia, unspecified: E78.5

## 2018-06-12 LAB — CBC
HEMATOCRIT: 39 % (ref 36.0–46.0)
HEMOGLOBIN: 12 g/dL (ref 12.0–15.0)
MCH: 27.6 pg (ref 26.0–34.0)
MCHC: 30.8 g/dL (ref 30.0–36.0)
MCV: 89.7 fL (ref 78.0–100.0)
Platelets: 225 10*3/uL (ref 150–400)
RBC: 4.35 MIL/uL (ref 3.87–5.11)
RDW: 14.6 % (ref 11.5–15.5)
WBC: 16.9 10*3/uL — ABNORMAL HIGH (ref 4.0–10.5)

## 2018-06-12 LAB — BRAIN NATRIURETIC PEPTIDE: B Natriuretic Peptide: 189.5 pg/mL — ABNORMAL HIGH (ref 0.0–100.0)

## 2018-06-12 LAB — BASIC METABOLIC PANEL
ANION GAP: 14 (ref 5–15)
BUN: 12 mg/dL (ref 8–23)
CALCIUM: 8.7 mg/dL — AB (ref 8.9–10.3)
CO2: 24 mmol/L (ref 22–32)
Chloride: 98 mmol/L (ref 98–111)
Creatinine, Ser: 1.24 mg/dL — ABNORMAL HIGH (ref 0.44–1.00)
GFR, EST AFRICAN AMERICAN: 45 mL/min — AB (ref >60)
GFR, EST NON AFRICAN AMERICAN: 39 mL/min — AB (ref >60)
GLUCOSE: 194 mg/dL — AB (ref 70–99)
POTASSIUM: 2.7 mmol/L — AB (ref 3.5–5.1)
Sodium: 136 mmol/L (ref 135–145)

## 2018-06-12 LAB — CBG MONITORING, ED: GLUCOSE-CAPILLARY: 210 mg/dL — AB (ref 70–99)

## 2018-06-12 LAB — I-STAT CG4 LACTIC ACID, ED: Lactic Acid, Venous: 1.71 mmol/L (ref 0.5–1.9)

## 2018-06-12 LAB — TROPONIN I: TROPONIN I: 0.1 ng/mL — AB (ref <0.03)

## 2018-06-12 MED ORDER — DILTIAZEM LOAD VIA INFUSION
10.0000 mg | Freq: Once | INTRAVENOUS | Status: AC
  Administered 2018-06-12: 10 mg via INTRAVENOUS
  Filled 2018-06-12: qty 10

## 2018-06-12 MED ORDER — POTASSIUM CHLORIDE IN NACL 20-0.9 MEQ/L-% IV SOLN
INTRAVENOUS | Status: AC
  Administered 2018-06-13: 01:00:00 via INTRAVENOUS
  Filled 2018-06-12 (×2): qty 1000

## 2018-06-12 MED ORDER — POTASSIUM CHLORIDE 10 MEQ/100ML IV SOLN
10.0000 meq | INTRAVENOUS | Status: AC
  Administered 2018-06-12 (×2): 10 meq via INTRAVENOUS
  Filled 2018-06-12 (×2): qty 100

## 2018-06-12 MED ORDER — LORATADINE 10 MG PO TABS
10.0000 mg | ORAL_TABLET | Freq: Every day | ORAL | Status: DC
  Administered 2018-06-13 – 2018-06-21 (×9): 10 mg via ORAL
  Filled 2018-06-12 (×9): qty 1

## 2018-06-12 MED ORDER — ENSURE ENLIVE PO LIQD: 237.0000 mL | Freq: Two times a day (BID) | ORAL | Status: DC

## 2018-06-12 MED ORDER — CHOLESTYRAMINE LIGHT 4 G PO PACK
4.0000 g | PACK | Freq: Every day | ORAL | Status: DC | PRN
  Filled 2018-06-12: qty 1

## 2018-06-12 MED ORDER — SACCHAROMYCES BOULARDII 250 MG PO CAPS
250.0000 mg | ORAL_CAPSULE | Freq: Two times a day (BID) | ORAL | Status: DC
  Administered 2018-06-13 – 2018-06-21 (×17): 250 mg via ORAL
  Filled 2018-06-12 (×17): qty 1

## 2018-06-12 MED ORDER — LACTATED RINGERS IV BOLUS: 1000.0000 mL | Freq: Once | INTRAVENOUS | Status: DC

## 2018-06-12 MED ORDER — LACTATED RINGERS IV BOLUS
250.0000 mL | Freq: Once | INTRAVENOUS | Status: AC
  Administered 2018-06-12: 250 mL via INTRAVENOUS

## 2018-06-12 MED ORDER — LINAGLIPTIN 5 MG PO TABS
5.0000 mg | ORAL_TABLET | Freq: Every day | ORAL | Status: DC
  Administered 2018-06-13: 5 mg via ORAL
  Filled 2018-06-12: qty 1

## 2018-06-12 MED ORDER — ACETAMINOPHEN 650 MG RE SUPP: 650.0000 mg | Freq: Four times a day (QID) | RECTAL | Status: DC | PRN

## 2018-06-12 MED ORDER — FUROSEMIDE 20 MG PO TABS
20.0000 mg | ORAL_TABLET | Freq: Every day | ORAL | Status: DC
  Administered 2018-06-13 – 2018-06-21 (×9): 20 mg via ORAL
  Filled 2018-06-12 (×9): qty 1

## 2018-06-12 MED ORDER — HEPARIN BOLUS VIA INFUSION
3500.0000 [IU] | Freq: Once | INTRAVENOUS | Status: AC
  Administered 2018-06-12: 3500 [IU] via INTRAVENOUS
  Filled 2018-06-12: qty 3500

## 2018-06-12 MED ORDER — POTASSIUM CHLORIDE 10 MEQ/100ML IV SOLN
10.0000 meq | Freq: Once | INTRAVENOUS | Status: AC
  Administered 2018-06-12: 10 meq via INTRAVENOUS
  Filled 2018-06-12: qty 100

## 2018-06-12 MED ORDER — ACETAMINOPHEN 325 MG PO TABS
650.0000 mg | ORAL_TABLET | Freq: Four times a day (QID) | ORAL | Status: DC | PRN
  Administered 2018-06-15 (×2): 650 mg via ORAL
  Filled 2018-06-12 (×2): qty 2

## 2018-06-12 MED ORDER — DILTIAZEM HCL-DEXTROSE 100-5 MG/100ML-% IV SOLN (PREMIX)
5.0000 mg/h | INTRAVENOUS | Status: DC
  Administered 2018-06-12: 5 mg/h via INTRAVENOUS
  Administered 2018-06-13 (×2): 10 mg/h via INTRAVENOUS
  Administered 2018-06-13: 7.5 mg/h via INTRAVENOUS
  Administered 2018-06-14 – 2018-06-15 (×3): 10 mg/h via INTRAVENOUS
  Filled 2018-06-12 (×7): qty 100

## 2018-06-12 MED ORDER — ASPIRIN EC 81 MG PO TBEC
81.0000 mg | DELAYED_RELEASE_TABLET | Freq: Every day | ORAL | Status: DC
  Administered 2018-06-13 – 2018-06-21 (×9): 81 mg via ORAL
  Filled 2018-06-12 (×9): qty 1

## 2018-06-12 MED ORDER — POTASSIUM CHLORIDE CRYS ER 20 MEQ PO TBCR
40.0000 meq | EXTENDED_RELEASE_TABLET | Freq: Once | ORAL | Status: AC
  Administered 2018-06-12: 40 meq via ORAL
  Filled 2018-06-12: qty 2

## 2018-06-12 MED ORDER — HEPARIN (PORCINE) IN NACL 100-0.45 UNIT/ML-% IJ SOLN
1000.0000 [IU]/h | INTRAMUSCULAR | Status: DC
  Administered 2018-06-12 – 2018-06-15 (×3): 900 [IU]/h via INTRAVENOUS
  Filled 2018-06-12 (×3): qty 250

## 2018-06-12 MED ORDER — EZETIMIBE 10 MG PO TABS
10.0000 mg | ORAL_TABLET | Freq: Every day | ORAL | Status: DC
  Administered 2018-06-13 – 2018-06-21 (×9): 10 mg via ORAL
  Filled 2018-06-12 (×9): qty 1

## 2018-06-12 NOTE — ED Triage Notes (Signed)
Pt presents with CBG reported to be in 300s at home since last night.  Pt reports dizziness and fatigue.  Pt was seen here last week for diarrhea and discharged, pt reports it continued when she got home until today.

## 2018-06-12 NOTE — H&P (Addendum)
TR[facility] H&P   Patient Demographics:    Chelsea Ward, is a 84 y.o. female  MRN: [id number]   DOB - 04-20-1933  Admit Date - 06/12/2018  Outpatient Primary MD for the patient is Deeann Saint, MD  Referring MD/NP/PA: Reinaldo Meeker  Outpatient Specialists:   Patient coming from: home  Chief Complaint  Patient presents with  . Hyperglycemia      HPI:    Chelsea Ward  is a 84 y.o. female, w hypertension, hyperlipidemia, dm2, CAD , recent episodes of colitis apparently presented from home with c/o generalized weakness and bs of 302 per pt. Pt notes that she is having some diarrhea still.   In ED,   Na 136, K 2.7,  Bun 12, Creatinine 1.24  Wbc 16.9, Hgb 12.0, Plt 225  BNP 189.5  Trop 0.10  EKG Afib  Pt will be admitted for hyperglycemia, hypokalemia and Afib with RVR and trop elevation.     Review of systems:    In addition to the HPI above,  No Fever-chills, No Headache, No changes with Vision or hearing, No problems swallowing food or Liquids, No Chest pain, Cough or Shortness of Breath, No Abdominal pain, No Nausea or Vommitting,  Still with diarrhea  No Blood in stool or Urine, No dysuria, No new skin rashes or bruises, No new joints pains-aches,  No new weakness, tingling, numbness in any extremity, No recent weight gain or loss, No polyuria, polydypsia or polyphagia, No significant Mental Stressors.  A full 10 point Review of Systems was done, except as stated above, all other Review of Systems were negative.   With Past History of the following :    Past Medical History:  Diagnosis Date  . Diabetes mellitus without complication (HCC)   . Heart disease   . Hypercholesteremia   . Hypertension       Past Surgical History:  Procedure Laterality Date  . COLON SURGERY    . EYE SURGERY        Social History:     Social History    Tobacco Use  . Smoking status: Never Smoker  . Smokeless tobacco: Never Used  Substance Use Topics  . Alcohol use: No     Lives - at home  Mobility - walks by self   Family History :     Family History  Problem Relation Age of Onset  . Diabetes Mellitus II Son   . Arthritis Mother   . Hearing loss Mother   . Arthritis Father   . Heart attack Father   . Arthritis Sister   . Heart attack Sister        Home Medications:   Prior to Admission medications   Medication Sig Start Date End Date Taking? Authorizing Provider  aspirin EC 81 MG tablet Take 81 mg by mouth daily.   Yes [provider]  ezetimibe (  ZETIA) 10 MG tablet Take 10 mg by mouth daily. 01/29/18  Yes [provider]  fexofenadine (ALLEGRA) 60 MG tablet Take 1 tablet (60 mg total) by mouth daily. 06/04/18  Yes Deeann Saint, MD  furosemide (LASIX) 20 MG tablet Take 1 tablet (20 mg total) by mouth daily. 05/23/18  Yes Danford, Earl Lites, MD  JANUVIA 100 MG tablet Take 100 mg by mouth daily. 03/02/18  Yes [provider]  Omega-3 Fatty Acids (FISH OIL) 500 MG CAPS Take 1 capsule by mouth 2 (two) times daily.   Yes [provider]     Allergies:    No Known Allergies   Physical Exam:   Vitals  Blood pressure (!) 129/95, pulse 81, temperature 97.8 F (36.6 C), temperature source Oral, resp. rate 20, SpO2 97 %.   1. General  lying in bed in NAD,   2. Normal affect and insight, Not Suicidal or Homicidal, Awake Alert, Oriented X 3.  3. No F.N deficits, ALL C.Nerves Intact, Strength 5/5 all 4 extremities, Sensation intact all 4 extremities, Plantars down going.  4. Ears and Eyes appear Normal, Conjunctivae clear, PERRLA. Moist Oral Mucosa.  5. Supple Neck, No JVD, No cervical lymphadenopathy appriciated, No Carotid Bruits.  6. Symmetrical Chest wall movement, Good air movement bilaterally, CTAB.  7. Irr, irr, s1, s2,   8. Positive Bowel Sounds, Abdomen Soft, No  tenderness, No organomegaly appriciated,No rebound -guarding or rigidity.  9.  No Cyanosis, Normal Skin Turgor, No Skin Rash or Bruise.  10. Good muscle tone,  joints appear normal , no effusions, Normal ROM.  11. No Palpable Lymph Nodes in Neck or Axillae     Data Review:    CBC Recent Labs  Lab 06/09/18 1507 06/12/18 1528  WBC 11.6* 16.9*  HGB 12.2 12.0  HCT 39.8 39.0  PLT 214 225  MCV 91.5 89.7  MCH 28.0 27.6  MCHC 30.7 30.8  RDW 14.9 14.6   ------------------------------------------------------------------------------------------------------------------  Chemistries  Recent Labs  Lab 06/09/18 1507 06/12/18 1528  NA 138 136  K 3.2* 2.7*  CL 103 98  CO2 22 24  GLUCOSE 122* 194*  BUN 9 12  CREATININE 1.14* 1.24*  CALCIUM 9.0 8.7*  AST 24  --   ALT 36  --   ALKPHOS 42  --   BILITOT 0.9  --    ------------------------------------------------------------------------------------------------------------------ estimated creatinine clearance is 29.7 mL/min (A) (by C-G formula based on SCr of 1.24 mg/dL (H)). ------------------------------------------------------------------------------------------------------------------ No results for input(s): TSH, T4TOTAL, T3FREE, THYROIDAB in the last 72 hours.  Invalid input(s): FREET3  Coagulation profile No results for input(s): INR, PROTIME in the last 168 hours. ------------------------------------------------------------------------------------------------------------------- No results for input(s): DDIMER in the last 72 hours. -------------------------------------------------------------------------------------------------------------------  Cardiac Enzymes Recent Labs  Lab 06/12/18 1903  TROPONINI 0.10*   ------------------------------------------------------------------------------------------------------------------    Component Value Date/Time   BNP 189.5 (H) 06/12/2018 1903      ---------------------------------------------------------------------------------------------------------------  Urinalysis    Component Value Date/Time   COLORURINE YELLOW 06/09/2018 2051   APPEARANCEUR CLEAR 06/09/2018 2051   LABSPEC 1.015 06/09/2018 2051   PHURINE 5.0 06/09/2018 2051   GLUCOSEU NEGATIVE 06/09/2018 2051   HGBUR NEGATIVE 06/09/2018 2051   BILIRUBINUR NEGATIVE 06/09/2018 2051   KETONESUR 5 (A) 06/09/2018 2051   PROTEINUR NEGATIVE 06/09/2018 2051   NITRITE NEGATIVE 06/09/2018 2051   LEUKOCYTESUR NEGATIVE 06/09/2018 2051    ----------------------------------------------------------------------------------------------------------------   Imaging Results:    Dg Chest Portable 1 View  Result Date: 06/12/2018 CLINICAL DATA:  Weakness EXAM: PORTABLE CHEST 1 VIEW COMPARISON:  05/21/2018 FINDINGS: Borderline heart size, stable. Mild aortic tortuosity. Interstitial coarsening at the bases is stable from 05/15/2018 when there was no acute disease in the lower chest by abdominal CT. No effusion or pneumothorax. IMPRESSION: Stable from prior.  No evidence of acute disease. Electronically Signed   By: Marnee SpringJonathon  Watts M.D.   On: 06/12/2018 19:06       Assessment & Plan:    Principal Problem:   Atrial fibrillation with RVR (HCC) Active Problems:   Diarrhea   Diabetes mellitus type 2 in nonobese (HCC)    Afib with RVR (chadsvasc2=5) Tele Trop I q6h x3 Check TSH Check cardiac echo cardizem GTT Heparin GTT  Trop elevation Check cpk Check trop I q6h x3  Diarrhea Check stool for C diff, GI Pathogen panel Cholestyramine  Prn  Dm2 Cont Januvia => Tradjenta  fsbs ac and qhs, ISS      DVT Prophylaxis Heparin -  SCDs   AM Labs Ordered, also please review Full Orders  Family Communication: Admission, patients condition and plan of care including tests being ordered have been discussed with the patient who indicate understanding and agree with the plan  and Code Status.  Code Status  FULL CODE  Likely DC to  home  Condition GUARDED    Consults called: none  Admission status: inpatient   Time spent in minutes : 60 critical care   Pearson GrippeJames Eiza Canniff M.D on 06/12/2018 at 10:33 PM  Between 7am to 7pm - Pager - [phone]  . After 7pm go to [email or web address] - password [email or web address]  [facility] - Office  [phone]

## 2018-06-12 NOTE — Progress Notes (Signed)
ANTICOAGULATION CONSULT NOTE - Initial Consult  Pharmacy Consult for heparin Indication: atrial fibrillation  No Known Allergies  Patient Measurements:   Heparin Dosing Weight: 63.5kg  Vital Signs: Temp: 97.8 F (36.6 C) (07/09 1453) Temp Source: Oral (07/09 1453) BP: 129/86 (07/09 2100) Pulse Rate: 83 (07/09 2100)  Labs: Recent Labs    06/12/18 1528 06/12/18 1903  HGB 12.0  --   HCT 39.0  --   PLT 225  --   CREATININE 1.24*  --   TROPONINI  --  0.10*    Estimated Creatinine Clearance: 29.7 mL/min (A) (by C-G formula based on SCr of 1.24 mg/dL (H)).   Medical History: Past Medical History:  Diagnosis Date  . Diabetes mellitus without complication (HCC)   . Heart disease   . Hypercholesteremia   . Hypertension     Medications:  Infusions:  . diltiazem (CARDIZEM) infusion 7.5 mg/hr (06/12/18 1959)  . heparin    . lactated ringers 250 mL (06/12/18 2043)  . potassium chloride      Assessment: [age] yof presented to the ED with hyperglycemia found to be in new onset afib. To start IV heparin for anticoagulation. Baseline CBC is WNL. She is not on anticoagulation PTA.   Goal of Therapy:  Heparin level 0.3-0.7 units/ml Monitor platelets by anticoagulation protocol: Yes   Plan:  Heparin bolus 3500 units IV x 1 Heparin gtt 900 units/hr Check an 8 hr heparin level Daily heparin level and CBC  Chelsea Ward, Chelsea Ward 06/12/2018,9:08 PM

## 2018-06-12 NOTE — ED Provider Notes (Signed)
Patient placed in Quick Look pathway, seen and evaluated   Chief Complaint: hyperglycemia  HPI:   CBG 300 this AM, report gen fatigue and abd pain  ROS: no fever, headache, cp, or dysuria (one)  Physical Exam:   Gen: No distress  Neuro: Awake and Alert  Skin: Warm    Focused Exam: NAD, abd soft and non tender.    Initiation of care has begun. The patient has been counseled on the process, plan, and necessity for staying for the completion/evaluation, and the remainder of the medical screening examination    Fayrene Helperran, Amoy Steeves, Cordelia Poche-C 06/12/18 1502    Pricilla LovelessGoldston, Scott, MD 06/13/18 [phone]

## 2018-06-12 NOTE — ED Notes (Signed)
IV team at bedside for 2nd line.  

## 2018-06-12 NOTE — ED Provider Notes (Addendum)
[facility] EMERGENCY DEPARTMENT Provider Note   CSN: [id number] Arrival date & time: 06/12/18  1428     History   Chief Complaint Chief Complaint  Patient presents with  . Hyperglycemia    HPI Chelsea Ward is a 84 y.o. female.  HPI   84 year old female with history of hypertension, hyperlipidemia, diabetes, recent C. difficile colitis, here with generalized weakness.  The patient's family states that over the last 24 hours, she has become acutely more weak than usual.  She seems very tired.  She is not responsive as usual.  Earlier today, they checked her sugar and it was "low" in the 100s, so they gave her Pedialyte and a protein shake, which increased her sugar so she became concerned.  On my assessment, the patient states she just feels tired.  She is unable to provide much additional history.  She denies any chest pain.  Denies any palpitations or shortness of breath.  The diarrhea has reportedly improved.  She does not have any fevers.  She is been urinating without difficulty.  Past Medical History:  Diagnosis Date  . Atrial fibrillation with RVR (HCC) 06/12/2018  . Diabetes mellitus without complication (HCC)   . Heart disease   . Hypercholesteremia   . Hypertension     Patient Active Problem List   Diagnosis Date Noted  . Atrial fibrillation with RVR (HCC) 06/12/2018  . Seasonal allergies 06/04/2018  . Controlled type 2 diabetes mellitus without complication, without long-term current use of insulin (HCC) 06/04/2018  . Congestive heart failure (HCC) 06/04/2018  . C. difficile colitis 05/17/2018  . SIRS (systemic inflammatory response syndrome) (HCC) 05/16/2018  . Diarrhea 05/16/2018  . Diabetes mellitus type 2 in nonobese (HCC) 05/16/2018  . HLD (hyperlipidemia) 05/16/2018    Past Surgical History:  Procedure Laterality Date  . COLON SURGERY    . EYE SURGERY       OB History   None      Home Medications    Prior to Admission  medications   Medication Sig Start Date End Date Taking? Authorizing Provider  aspirin EC 81 MG tablet Take 81 mg by mouth daily.   Yes [provider]  ezetimibe (ZETIA) 10 MG tablet Take 10 mg by mouth daily. 01/29/18  Yes [provider]  fexofenadine (ALLEGRA) 60 MG tablet Take 1 tablet (60 mg total) by mouth daily. 06/04/18  Yes Deeann Saint, MD  furosemide (LASIX) 20 MG tablet Take 1 tablet (20 mg total) by mouth daily. 05/23/18  Yes Danford, Earl Lites, MD  JANUVIA 100 MG tablet Take 100 mg by mouth daily. 03/02/18  Yes [provider]  Omega-3 Fatty Acids (FISH OIL) 500 MG CAPS Take 1 capsule by mouth 2 (two) times daily.   Yes [provider]    Family History Family History  Problem Relation Age of Onset  . Diabetes Mellitus II Son   . Arthritis Mother   . Hearing loss Mother   . Arthritis Father   . Heart attack Father   . Arthritis Sister   . Heart attack Sister     Social History Social History   Tobacco Use  . Smoking status: Never Smoker  . Smokeless tobacco: Never Used  Substance Use Topics  . Alcohol use: No  . Drug use: Not on file     Allergies   Patient has no known allergies.   Review of Systems Review of Systems  Constitutional: Positive for fatigue. Negative for  chills and fever.  HENT: Negative for congestion, rhinorrhea and sore throat.   Eyes: Negative for visual disturbance.  Respiratory: Negative for cough, shortness of breath and wheezing.   Cardiovascular: Negative for chest pain and leg swelling.  Gastrointestinal: Negative for abdominal pain, diarrhea, nausea and vomiting.  Genitourinary: Negative for dysuria, flank pain, vaginal bleeding and vaginal discharge.  Musculoskeletal: Negative for neck pain.  Skin: Negative for rash.  Allergic/Immunologic: Negative for immunocompromised state.  Neurological: Positive for weakness. Negative for syncope and headaches.  Hematological: Does not bruise/bleed  easily.  All other systems reviewed and are negative.    Physical Exam Updated Vital Signs BP 110/82 (BP Location: Left Arm)   Pulse 69   Temp 97.8 F (36.6 C) (Oral)   Resp 20   Wt 64.8 kg (142 lb 13.7 oz)   SpO2 99%   BMI 24.52 kg/m   Physical Exam  Constitutional: She is oriented to person, place, and time. She appears well-developed and well-nourished. No distress.  HENT:  Head: Normocephalic and atraumatic.  Eyes: Conjunctivae are normal.  Neck: Neck supple.  Cardiovascular: Normal heart sounds. An irregularly irregular rhythm present. Tachycardia present. Exam reveals no friction rub.  No murmur heard. Pulmonary/Chest: Effort normal and breath sounds normal. No respiratory distress. She has no wheezes. She has no rales.  Abdominal: She exhibits no distension.  Musculoskeletal: She exhibits no edema.  Neurological: She is alert and oriented to person, place, and time. She exhibits normal muscle tone.  Skin: Skin is warm. Capillary refill takes less than 2 seconds.  Psychiatric: She has a normal mood and affect.  Nursing note and vitals reviewed.    ED Treatments / Results  Labs (all labs ordered are listed, but only abnormal results are displayed) Labs Reviewed  BASIC METABOLIC PANEL - Abnormal; Notable for the following components:      Result Value   Potassium 2.7 (*)    Glucose, Bld 194 (*)    Creatinine, Ser 1.24 (*)    Calcium 8.7 (*)    GFR calc non Af Amer 39 (*)    GFR calc Af Amer 45 (*)    All other components within normal limits  CBC - Abnormal; Notable for the following components:   WBC 16.9 (*)    All other components within normal limits  BRAIN NATRIURETIC PEPTIDE - Abnormal; Notable for the following components:   B Natriuretic Peptide 189.5 (*)    All other components within normal limits  TROPONIN I - Abnormal; Notable for the following components:   Troponin I 0.10 (*)    All other components within normal limits  CBG MONITORING, ED -  Abnormal; Notable for the following components:   Glucose-Capillary 210 (*)    All other components within normal limits  MRSA PCR SCREENING  URINALYSIS, ROUTINE W REFLEX MICROSCOPIC  HEPARIN LEVEL (UNFRACTIONATED)  CBC  TSH  COMPREHENSIVE METABOLIC PANEL  TROPONIN I  TROPONIN I  TROPONIN I  I-STAT CG4 LACTIC ACID, ED  I-STAT CG4 LACTIC ACID, ED    EKG EKG Interpretation  Date/Time:  Tuesday June 12 2018 18:31:15 EDT Ventricular Rate:  130 PR Interval:    QRS Duration: 87 QT Interval:  320 QTC Calculation: 471 R Axis:   -28 Text Interpretation:  Sinus tachycardia Multiform ventricular premature complexes Abnormal R-wave progression, late transition Left ventricular hypertrophy Nonspecific T abnormalities, lateral leads Since last EKG, AFib RVR persists Confirmed by Shaune PollackIsaacs, Zohal Reny [phone]([id number]) on 06/12/2018 7:31:27 PM   Radiology Dg  Chest Portable 1 View  Result Date: 06/12/2018 CLINICAL DATA:  Weakness EXAM: PORTABLE CHEST 1 VIEW COMPARISON:  05/21/2018 FINDINGS: Borderline heart size, stable. Mild aortic tortuosity. Interstitial coarsening at the bases is stable from 05/15/2018 when there was no acute disease in the lower chest by abdominal CT. No effusion or pneumothorax. IMPRESSION: Stable from prior.  No evidence of acute disease. Electronically Signed   By: Marnee Spring M.D.   On: 06/12/2018 19:06    Procedures .Critical Care Performed by: Shaune Pollack, MD Authorized by: Shaune Pollack, MD   Critical care provider statement:    Critical care time (minutes):  35   Critical care time was exclusive of:  Separately billable procedures and treating other patients and teaching time   Critical care was necessary to treat or prevent imminent or life-threatening deterioration of the following conditions:  Circulatory failure, cardiac failure, respiratory failure and metabolic crisis   Critical care was time spent personally by me on the following activities:  Development of  treatment plan with patient or surrogate, discussions with consultants, evaluation of patient's response to treatment, examination of patient, obtaining history from patient or surrogate, ordering and performing treatments and interventions, ordering and review of laboratory studies, ordering and review of radiographic studies, pulse oximetry, re-evaluation of patient's condition and review of old charts   I assumed direction of critical care for this patient from another provider in my specialty: no     (including critical care time)  Medications Ordered in ED Medications  potassium chloride 10 mEq in 100 mL IVPB (0 mEq Intravenous Stopped 06/12/18 2153)  diltiazem (CARDIZEM) 1 mg/mL load via infusion 10 mg (10 mg Intravenous Bolus from Bag 06/12/18 1924)    And  diltiazem (CARDIZEM) 100 mg in dextrose 5% (1 mg/mL) infusion (7.5 mg/hr Intravenous Rate/Dose Change 06/12/18 1959)  heparin ADULT infusion 100 units/mL (25000 units/29mL sodium chloride 0.45%) (900 Units/hr Intravenous New Bag/Given 06/12/18 2147)  0.9 % NaCl with KCl 20 mEq/ L  infusion (has no administration in time range)  acetaminophen (TYLENOL) tablet 650 mg (has no administration in time range)    Or  acetaminophen (TYLENOL) suppository 650 mg (has no administration in time range)  potassium chloride SA (K-DUR,KLOR-CON) CR tablet 40 mEq (40 mEq Oral Given 06/12/18 1826)  lactated ringers bolus 250 mL (0 mLs Intravenous Stopped 06/12/18 2154)  potassium chloride 10 mEq in 100 mL IVPB (10 mEq Intravenous New Bag/Given 06/12/18 2155)  heparin bolus via infusion 3,500 Units (3,500 Units Intravenous Bolus from Bag 06/12/18 2149)     Initial Impression / Assessment and Plan / ED Course  I have reviewed the triage vital signs and the nursing notes.  Pertinent labs & imaging results that were available during my care of the patient were reviewed by me and considered in my medical decision making (see chart for details).     84 year old  female here with generalized weakness.  On arrival, patient noted to be in new onset atrial fibrillation with rapid ventricular response.  I do not see mention of this in her previous notes.  Patient also with hypokalemia and troponin elevation.  I suspect her troponin elevation is due to demand.  Patient appears fatigued but is protecting her airway on exam.  Patient placed on diltiazem bolus and drip with good rate control.  Will admit for new onset A. fib RVR in the setting of multiple electrolyte disturbances.  CHA2Ds2-VASc Score for Atrial Fibrillation    Patient Score  Age <  65 = 0 65-74 = 1 > 75 = 2 2  Sex Female = 0 Female = 1 1  CHF History No = 0  Yes = 1 0  HTN History No = 0  Yes = 1 1  Stroke/TIA/TE History No = 0  Yes = 1 0  Vascular Disease History No = 0  Yes = 1 0  Diabetes History No = 0  Yes = 1 1  Total:  5   7.2 % stroke rate/year from a score of 5   Final Clinical Impressions(s) / ED Diagnoses   Final diagnoses:  Hyperglycemia  Atrial fibrillation with rapid ventricular response (HCC)      Shaune Pollack, MD 06/12/18 2332    Shaune Pollack, MD 06/25/18 [phone]

## 2018-06-12 NOTE — ED Notes (Signed)
Bladder scan on pt read 0mL.

## 2018-06-12 NOTE — ED Notes (Signed)
Admitting at bedside 

## 2018-06-13 ENCOUNTER — Inpatient Hospital Stay (HOSPITAL_COMMUNITY): Payer: Medicare Other

## 2018-06-13 DIAGNOSIS — A0472 Enterocolitis due to Clostridium difficile, not specified as recurrent: Principal | ICD-10-CM

## 2018-06-13 DIAGNOSIS — A09 Infectious gastroenteritis and colitis, unspecified: Secondary | ICD-10-CM

## 2018-06-13 DIAGNOSIS — R748 Abnormal levels of other serum enzymes: Secondary | ICD-10-CM

## 2018-06-13 DIAGNOSIS — I4891 Unspecified atrial fibrillation: Secondary | ICD-10-CM

## 2018-06-13 DIAGNOSIS — L8990 Pressure ulcer of unspecified site, unspecified stage: Secondary | ICD-10-CM

## 2018-06-13 LAB — GASTROINTESTINAL PANEL BY PCR, STOOL (REPLACES STOOL CULTURE)
ASTROVIRUS: NOT DETECTED
Adenovirus F40/41: NOT DETECTED
CAMPYLOBACTER SPECIES: NOT DETECTED
CRYPTOSPORIDIUM: NOT DETECTED
Cyclospora cayetanensis: NOT DETECTED
ENTAMOEBA HISTOLYTICA: NOT DETECTED
ENTEROTOXIGENIC E COLI (ETEC): NOT DETECTED
Enteroaggregative E coli (EAEC): DETECTED — AB
Enteropathogenic E coli (EPEC): DETECTED — AB
Giardia lamblia: NOT DETECTED
Norovirus GI/GII: NOT DETECTED
PLESIMONAS SHIGELLOIDES: NOT DETECTED
Rotavirus A: NOT DETECTED
SAPOVIRUS (I, II, IV, AND V): NOT DETECTED
SHIGA LIKE TOXIN PRODUCING E COLI (STEC): NOT DETECTED
Salmonella species: NOT DETECTED
Shigella/Enteroinvasive E coli (EIEC): NOT DETECTED
VIBRIO CHOLERAE: NOT DETECTED
VIBRIO SPECIES: NOT DETECTED
Yersinia enterocolitica: NOT DETECTED

## 2018-06-13 LAB — CBC
HEMATOCRIT: 36.5 % (ref 36.0–46.0)
Hemoglobin: 11.4 g/dL — ABNORMAL LOW (ref 12.0–15.0)
MCH: 28.1 pg (ref 26.0–34.0)
MCHC: 31.2 g/dL (ref 30.0–36.0)
MCV: 90.1 fL (ref 78.0–100.0)
Platelets: 197 10*3/uL (ref 150–400)
RBC: 4.05 MIL/uL (ref 3.87–5.11)
RDW: 14.8 % (ref 11.5–15.5)
WBC: 16.8 10*3/uL — AB (ref 4.0–10.5)

## 2018-06-13 LAB — GLUCOSE, CAPILLARY
GLUCOSE-CAPILLARY: 173 mg/dL — AB (ref 70–99)
Glucose-Capillary: 161 mg/dL — ABNORMAL HIGH (ref 70–99)
Glucose-Capillary: 169 mg/dL — ABNORMAL HIGH (ref 70–99)
Glucose-Capillary: 161 mg/dL — ABNORMAL HIGH (ref 70–99)

## 2018-06-13 LAB — LIPID PANEL
CHOL/HDL RATIO: 3 ratio
CHOLESTEROL: 99 mg/dL (ref 0–200)
HDL: 33 mg/dL — ABNORMAL LOW (ref >40)
LDL Cholesterol: 53 mg/dL (ref 0–99)
TRIGLYCERIDES: 63 mg/dL (ref <150)
VLDL: 13 mg/dL (ref 0–40)

## 2018-06-13 LAB — HEPARIN LEVEL (UNFRACTIONATED)
Heparin Unfractionated: 0.5 IU/mL (ref 0.30–0.70)
Heparin Unfractionated: 0.55 IU/mL (ref 0.30–0.70)

## 2018-06-13 LAB — TSH: TSH: 1.606 u[IU]/mL (ref 0.350–4.500)

## 2018-06-13 LAB — COMPREHENSIVE METABOLIC PANEL
ALBUMIN: 2.5 g/dL — AB (ref 3.5–5.0)
ALK PHOS: 43 U/L (ref 38–126)
ALT: 21 U/L (ref 0–44)
AST: 17 U/L (ref 15–41)
Anion gap: 13 (ref 5–15)
BUN: 9 mg/dL (ref 8–23)
CHLORIDE: 104 mmol/L (ref 98–111)
CO2: 20 mmol/L — ABNORMAL LOW (ref 22–32)
CREATININE: 0.99 mg/dL (ref 0.44–1.00)
Calcium: 8 mg/dL — ABNORMAL LOW (ref 8.9–10.3)
GFR calc Af Amer: 59 mL/min — ABNORMAL LOW (ref >60)
GFR calc non Af Amer: 51 mL/min — ABNORMAL LOW (ref >60)
GLUCOSE: 157 mg/dL — AB (ref 70–99)
Potassium: 3.6 mmol/L (ref 3.5–5.1)
SODIUM: 137 mmol/L (ref 135–145)
Total Bilirubin: 0.7 mg/dL (ref 0.3–1.2)
Total Protein: 6.4 g/dL — ABNORMAL LOW (ref 6.5–8.1)

## 2018-06-13 LAB — C DIFFICILE QUICK SCREEN W PCR REFLEX
C DIFFICILE (CDIFF) INTERP: DETECTED
C Diff antigen: POSITIVE — AB
C Diff toxin: POSITIVE — AB

## 2018-06-13 LAB — HEMOGLOBIN A1C
HEMOGLOBIN A1C: 7.1 % — AB (ref 4.8–5.6)
Mean Plasma Glucose: 157.07 mg/dL

## 2018-06-13 LAB — TROPONIN I
TROPONIN I: 0.06 ng/mL — AB (ref <0.03)
Troponin I: 0.04 ng/mL (ref <0.03)
Troponin I: 0.03 ng/mL (ref <0.03)

## 2018-06-13 LAB — MRSA PCR SCREENING: MRSA by PCR: NEGATIVE

## 2018-06-13 MED ORDER — JUVEN PO PACK
1.0000 | PACK | Freq: Two times a day (BID) | ORAL | Status: DC
  Administered 2018-06-13 – 2018-06-20 (×14): 1 via ORAL
  Filled 2018-06-13 (×15): qty 1

## 2018-06-13 MED ORDER — POLYVINYL ALCOHOL 1.4 % OP SOLN
1.0000 [drp] | OPHTHALMIC | Status: DC | PRN
  Administered 2018-06-13 – 2018-06-20 (×12): 1 [drp] via OPHTHALMIC
  Filled 2018-06-13: qty 15

## 2018-06-13 MED ORDER — INSULIN ASPART 100 UNIT/ML ~~LOC~~ SOLN
0.0000 [IU] | Freq: Three times a day (TID) | SUBCUTANEOUS | Status: DC
  Administered 2018-06-14 (×2): 2 [IU] via SUBCUTANEOUS
  Administered 2018-06-14 – 2018-06-15 (×2): 1 [IU] via SUBCUTANEOUS
  Administered 2018-06-15 (×2): 2 [IU] via SUBCUTANEOUS
  Administered 2018-06-16 – 2018-06-19 (×7): 1 [IU] via SUBCUTANEOUS
  Administered 2018-06-21: 2 [IU] via SUBCUTANEOUS
  Administered 2018-06-21: 1 [IU] via SUBCUTANEOUS

## 2018-06-13 MED ORDER — VANCOMYCIN 50 MG/ML ORAL SOLUTION
125.0000 mg | Freq: Four times a day (QID) | ORAL | Status: DC
  Administered 2018-06-13 – 2018-06-21 (×33): 125 mg via ORAL
  Filled 2018-06-13 (×34): qty 2.5

## 2018-06-13 NOTE — Progress Notes (Signed)
ANTICOAGULATION CONSULT NOTE - Follow-Up Consult  Pharmacy Consult for heparin Indication: atrial fibrillation  No Known Allergies  Patient Measurements: Height: 5\' 4"  (162.6 cm) Weight: 142 lb 13.7 oz (64.8 kg) IBW/kg (Calculated) : 54.7 Heparin Dosing Weight: 63.5kg  Vital Signs: Temp: 98.7 F (37.1 C) (07/10 0637) Temp Source: Oral (07/10 0637) BP: 125/68 (07/10 1309) Pulse Rate: 81 (07/10 1242)  Labs: Recent Labs    06/12/18 1528  06/13/18 0013 06/13/18 0521 06/13/18 0950 06/13/18 1330  HGB 12.0  --   --  11.4*  --   --   HCT 39.0  --   --  36.5  --   --   PLT 225  --   --  197  --   --   HEPARINUNFRC  --   --   --  0.50  --  0.55  CREATININE 1.24*  --   --  0.99  --   --   TROPONINI  --    < > 0.06* 0.04* 0.03*  --    < > = values in this interval not displayed.    Estimated Creatinine Clearance: 37.2 mL/min (by C-G formula based on SCr of 0.99 mg/dL).  Medications:  Infusions:  . diltiazem (CARDIZEM) infusion 7.5 mg/hr (06/13/18 1100)  . heparin 900 Units/hr (06/13/18 1100)    Assessment: [age] YOF presented to the ED with hyperglycemia found to be in new onset afib. To start IV heparin for anticoagulation.   Heparin level therapeutic this afternoon  Goal of Therapy:  Heparin level 0.3-0.7 units/ml Monitor platelets by anticoagulation protocol: Yes   Plan:  - Continue Heparin at 900 units/hr (9 ml/hr) - Follow up daily heparin level, CBC  Thank you   Thank you [name], PharmD [phone] 06/13/2018 2:20 PM

## 2018-06-13 NOTE — Progress Notes (Signed)
[facility]  PROGRESS NOTE  Chelsea Ward ZOX:[id number] DOB: 1933-08-08 DOA: 06/12/2018 PCP: Deeann Saint, MD   Brief HPI:   84 year old female with history of hypertension, hyperlipidemia, diabetes mellitus, CAD, recent episodes of colitis came from home with complaints of generalized weakness and hypoglycemia with blood glucose 302.  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  Started on vancomycin. Also patient found to have new onset A. fib started on IV heparin, IV Cardizem   Subjective   Patient seen and examined, continues to have diarrhea   Assessment/Plan:     1. Atrial fibrillation with RVR-new onset, continue IV heparin, Cardizem.  Will consult cardiology for further recommendations.  Patient had echocardiogram in June 2019, will not repeat echo at this time. 2. Diarrhea-patient has positive C. difficile toxin as well as C. difficile antigen.  Will start vancomycin 125 mg 4 times a day for 2 weeks. 3. Diabetes mellitus-blood glucose well controlled, will discontinue oral hypoglycemic agents.  Start sliding scale insulin with NovoLog. 4. Elevated troponin-mild elevation of troponin, trending downwards.  Likely from demand ischemia from A. fib with RVR.    DVT prophylaxis: Heparin  Code Status: Full code  Family Communication: Discussed with patient's son at bedside  Disposition Plan: likely home when medically ready for discharge   Consultants:  None  Procedures:  None   Antibiotics:   Anti-infectives (From admission, onward)   Start     Dose/Rate Route Frequency Ordered Stop   06/13/18 1400  vancomycin (VANCOCIN) 50 mg/mL oral solution 125 mg     125 mg Oral Every 6 hours 06/13/18 1357 06/27/18 1159       Objective   Vitals:   06/13/18 1134 06/13/18 1205 06/13/18 1242 06/13/18 1309  BP: 117/62 128/81 123/84 125/68  Pulse: 81 75 81   Resp: 20 (!) 22 (!) 22 (!) 21  Temp:      TempSrc:      SpO2: 98% 99% 100%   Weight:      Height:         Intake/Output Summary (Last 24 hours) at 06/13/2018 1921 Last data filed at 06/13/2018 1239 Gross per 24 hour  Intake 1618.57 ml  Output 100 ml  Net 1518.57 ml   Filed Weights   06/12/18 2321 06/13/18 0637  Weight: 64.8 kg (142 lb 13.7 oz) 64.8 kg (142 lb 13.7 oz)     Physical Examination:    General: Appears in no acute distress  Cardiovascular: S1-S2, regular  Respiratory: Clear to auscultation bilaterally  Abdomen: Soft, nontender, no organomegaly  Extremities: No cyanosis clubbing or edema of the lower extremities  Neurologic:  *Alert, oriented x3     Data Reviewed: I have personally reviewed following labs and imaging studies  CBG: Recent Labs  Lab 06/12/18 1513 06/13/18 0819 06/13/18 1132 06/13/18 1646  GLUCAP 210* 161* 169* 173*    CBC: Recent Labs  Lab 06/09/18 1507 06/12/18 1528 06/13/18 0521  WBC 11.6* 16.9* 16.8*  HGB 12.2 12.0 11.4*  HCT 39.8 39.0 36.5  MCV 91.5 89.7 90.1  PLT 214 225 197    Basic Metabolic Panel: Recent Labs  Lab 06/09/18 1507 06/12/18 1528 06/13/18 0521  NA 138 136 137  K 3.2* 2.7* 3.6  CL 103 98 104  CO2 22 24 20*  GLUCOSE 122* 194* 157*  BUN 9 12 9   CREATININE 1.14* 1.24* 0.99  CALCIUM 9.0 8.7* 8.0*    Recent Results (from the past 240 hour(s))  MRSA PCR Screening  Status: None   Collection Time: 06/12/18 11:27 PM  Result Value Ref Range Status   MRSA by PCR NEGATIVE NEGATIVE Final    Comment:        The GeneXpert MRSA Assay (FDA approved for NASAL specimens only), is one component of a comprehensive MRSA colonization surveillance program. It is not intended to diagnose MRSA infection nor to guide or monitor treatment for MRSA infections. Performed at [facility], [address] [place], [place] [address]   Gastrointestinal Panel by PCR , Stool     Status: Abnormal   Collection Time: 06/13/18  3:25 AM  Result Value Ref Range Status   Campylobacter species NOT DETECTED NOT  DETECTED Final   Plesimonas shigelloides NOT DETECTED NOT DETECTED Final   Salmonella species NOT DETECTED NOT DETECTED Final   Yersinia enterocolitica NOT DETECTED NOT DETECTED Final   Vibrio species NOT DETECTED NOT DETECTED Final   Vibrio cholerae NOT DETECTED NOT DETECTED Final   Enteroaggregative E coli (EAEC) DETECTED (A) NOT DETECTED Final    Comment: RESULT CALLED TO, READ BACK BY AND VERIFIED WITH: TINA TOREVILLE @1418  06/13/18 AKT    Enteropathogenic E coli (EPEC) DETECTED (A) NOT DETECTED Final    Comment: RESULT CALLED TO, READ BACK BY AND VERIFIED WITH: TINA TOREVILLE @1418  06/13/18 AKT    Enterotoxigenic E coli (ETEC) NOT DETECTED NOT DETECTED Final   Shiga like toxin producing E coli (STEC) NOT DETECTED NOT DETECTED Final   Shigella/Enteroinvasive E coli (EIEC) NOT DETECTED NOT DETECTED Final   Cryptosporidium NOT DETECTED NOT DETECTED Final   Cyclospora cayetanensis NOT DETECTED NOT DETECTED Final   Entamoeba histolytica NOT DETECTED NOT DETECTED Final   Giardia lamblia NOT DETECTED NOT DETECTED Final   Adenovirus F40/41 NOT DETECTED NOT DETECTED Final   Astrovirus NOT DETECTED NOT DETECTED Final   Norovirus GI/GII NOT DETECTED NOT DETECTED Final   Rotavirus A NOT DETECTED NOT DETECTED Final   Sapovirus (I, II, IV, and V) NOT DETECTED NOT DETECTED Final    Comment: Performed at [facility], [address] [place], [place] [address]  C difficile quick scan w PCR reflex     Status: Abnormal   Collection Time: 06/13/18  3:25 AM  Result Value Ref Range Status   C Diff antigen POSITIVE (A) NEGATIVE Final   C Diff toxin POSITIVE (A) NEGATIVE Final   C Diff interpretation Toxin producing C. difficile detected.  Final    Comment: CRITICAL RESULT CALLED TO, READ BACK BY AND VERIFIED WITH: T.TOUR[place]ILLE RN AT [phone] 06/13/18 BY A.DAVIS Performed at [facility], [address] [place], [place] [address]      Liver Function Tests: Recent Labs  Lab  06/09/18 1507 06/13/18 0521  AST 24 17  ALT 36 21  ALKPHOS 42 43  BILITOT 0.9 0.7  PROT 7.9 6.4*  ALBUMIN 3.4* 2.5*   Recent Labs  Lab 06/09/18 1507  LIPASE 30   No results for input(s): AMMONIA in the last 168 hours.  Cardiac Enzymes: Recent Labs  Lab 06/12/18 1903 06/13/18 0013 06/13/18 0521 06/13/18 0950  TROPONINI 0.10* 0.06* 0.04* 0.03*   BNP (last 3 results) Recent Labs    06/12/18 1903  BNP 189.5*    ProBNP (last 3 results) No results for input(s): PROBNP in the last 8760 hours.    Studies: Dg Chest Portable 1 View  Result Date: 06/12/2018 CLINICAL DATA:  Weakness EXAM: PORTABLE CHEST 1 VIEW COMPARISON:  05/21/2018 FINDINGS: Borderline heart  size, stable. Mild aortic tortuosity. Interstitial coarsening at the bases is stable from 05/15/2018 when there was no acute disease in the lower chest by abdominal CT. No effusion or pneumothorax. IMPRESSION: Stable from prior.  No evidence of acute disease. Electronically Signed   By: Marnee Spring M.D.   On: 06/12/2018 19:06    Scheduled Meds: . aspirin EC  81 mg Oral Daily  . ezetimibe  10 mg Oral Daily  . furosemide  20 mg Oral Daily  . linagliptin  5 mg Oral Daily  . loratadine  10 mg Oral Daily  . nutrition supplement (JUVEN)  1 packet Oral BID BM  . saccharomyces boulardii  250 mg Oral BID  . vancomycin  125 mg Oral Q6H      Time spent: 25 min  Meredeth Ide   [facility] Pager [phone]. If 7PM-7AM, please contact night-coverage at [email or web address], Office  [phone]  password [email or web address]  06/13/2018, 7:21 PM  LOS: 1 day

## 2018-06-13 NOTE — Consult Note (Addendum)
Cardiology Consultation:   Ward ID: Chelsea Ward; [id number]; 1933/10/21   Admit date: 06/12/2018 Date of Consult: 06/13/2018  Primary Care Provider: Deeann Saint, MD Primary Cardiologist: New; previously [facility], [place]   Ward Profile:   Chelsea Ward is a 84 y.o. female with a hx of HTN, HLD, DM2, heart failure (unknown baseline) and recent colitis exacerbation who is being seen today for Chelsea evaluation of new onset atrial fibrillation at Chelsea request of Dr. Selena Batten.  History of Present Illness:   Chelsea Ward is an 84yo F with a hx as stated above who presented to [facility] on 06/12/18 with c/o generalized weakness, elevated blood sugar and dizziness. She states that she recently moved from [place] to [place] area approximately 1 month ago to live with her son after she lost her daughter. She was previously followed by cardiology in [place] area however she cannot recall [place] specific cardiologist name. History is slightly difficult to obtain secondary to extremely flat affect. She reports feeling generalized weakness and dizziness for Chelsea last several weeks however reports a return to baseline after her hospital discharge on 05/23/2018 for mitis exacerbation.  However, she states that she has become more acutely fatigued with an increased frequency of her diarrhea along with dizziness over Chelsea last several days.  Given her symptoms and recent past history, she reported to [facility] for further evaluation. She denies chest pain, SOB, LE swelling, orthopnea, palpitations, or syncope.  She denies recent fever, nausea or cough.  She denies personal history of atrial fibrillation in [place] past.  She reports only history of heart failure in which she was being treated in [place].  There are no current records available in care everywhere.  In [place] ED, her K+ was noted to be 2.7 on admission. Her creatinine was noted to be 1.24. Her WBC count was elevated at 16.9 and BNP 189. Trop noted to be  mildly elevated at 0.06>0.04>0.03. EKG revealed atrial fibrillation with a rate of 130bpm. Lactic acid was elevated at 1.71.  She has been afebrile and her other vital signs have been normal.  Of note, she was recently seen in [place] ED for abdominal pain and diarrhea, found to have colitis and discharged home on oral Cipro and flagyl. She was then seen presented back to [place] ED after 5 episodes of diarrhea, abdominal pain, fever and tachycardia, found to be C. Diff toxin positive. An echocardiogram was performed and found to have an EF of 45% (not new for her). Her lasix was resumed and her BB was held due to bradycardia.   Past Medical History:  Diagnosis Date  . Arthritis    "arms, knees" (06/12/2018)  . Atrial fibrillation with RVR (HCC) 06/12/2018  . CHF (congestive heart failure) (HCC)   . Heart disease   . Hypercholesteremia   . Hyperlipidemia   . Hypertension   . Type II diabetes mellitus (HCC)     Past Surgical History:  Procedure Laterality Date  . CATARACT EXTRACTION, BILATERAL Bilateral   . COLON SURGERY     "my bowels lock"  . TUBAL LIGATION    . VAGINAL HYSTERECTOMY       Prior to Admission medications   Medication Sig Start Date End Date Taking? Authorizing Provider  aspirin EC 81 MG tablet Take 81 mg by mouth daily.   Yes [provider]  ezetimibe (ZETIA) 10 MG tablet Take 10 mg by mouth daily. 01/29/18  Yes [provider]  fexofenadine Joyce Copa)  60 MG tablet Take 1 tablet (60 mg total) by mouth daily. 06/04/18  Yes Deeann SaintBanks, Shannon R, MD  furosemide (LASIX) 20 MG tablet Take 1 tablet (20 mg total) by mouth daily. 05/23/18  Yes Danford, Earl Liteshristopher P, MD  JANUVIA 100 MG tablet Take 100 mg by mouth daily. 03/02/18  Yes [provider]  Omega-3 Fatty Acids (FISH OIL) 500 MG CAPS Take 1 capsule by mouth 2 (two) times daily.   Yes [provider]    Inpatient Medications: Scheduled Meds: . aspirin EC  81 mg Oral Daily  . ezetimibe  10 mg  Oral Daily  . feeding supplement (ENSURE ENLIVE)  237 mL Oral BID BM  . furosemide  20 mg Oral Daily  . linagliptin  5 mg Oral Daily  . loratadine  10 mg Oral Daily  . saccharomyces boulardii  250 mg Oral BID  . vancomycin  125 mg Oral Q6H   Continuous Infusions: . diltiazem (CARDIZEM) infusion 7.5 mg/hr (06/13/18 1100)  . heparin 900 Units/hr (06/13/18 1100)   PRN Meds: acetaminophen **OR** acetaminophen, cholestyramine light  Allergies:   No Known Allergies  Social History:   Social History   Socioeconomic History  . Marital status: Widowed    Spouse name: Not on file  . Number of children: Not on file  . Years of education: Not on file  . Highest education level: Not on file  Occupational History  . Not on file  Social Needs  . Financial resource strain: Not on file  . Food insecurity:    Worry: Not on file    Inability: Not on file  . Transportation needs:    Medical: Not on file    Non-medical: Not on file  Tobacco Use  . Smoking status: Never Smoker  . Smokeless tobacco: Never Used  Substance and Sexual Activity  . Alcohol use: Never    Frequency: Never  . Drug use: Never  . Sexual activity: Not Currently  Lifestyle  . Physical activity:    Days per week: Not on file    Minutes per session: Not on file  . Stress: Not on file  Relationships  . Social connections:    Talks on phone: Not on file    Gets together: Not on file    Attends religious service: Not on file    Active member of club or organization: Not on file    Attends meetings of clubs or organizations: Not on file    Relationship status: Not on file  . Intimate partner violence:    Fear of current or ex partner: Not on file    Emotionally abused: Not on file    Physically abused: Not on file    Forced sexual activity: Not on file  Other Topics Concern  . Not on file  Social History Narrative  . Not on file    Family History:   Family History  Problem Relation Age of Onset  .  Diabetes Mellitus II Son   . Arthritis Mother   . Hearing loss Mother   . Arthritis Father   . Heart attack Father   . Arthritis Sister   . Heart attack Sister    Family Status:  Family Status  Relation Name Status  . Son  (Not Specified)  . Mother  (Not Specified)  . Father  (Not Specified)  . Sister  (Not Specified)    ROS:  Please see Chelsea history of present illness.  All other ROS reviewed  and negative.     Physical Exam/Data:   Vitals:   06/13/18 0430 06/13/18 0500 06/13/18 0637 06/13/18 1242  BP: 107/70 126/64 117/64 123/84  Pulse: 76 76 78 81  Resp: 18 19 20  (!) 22  Temp:   98.7 F (37.1 C)   TempSrc:   Oral   SpO2: 99% 98% 100% 100%  Weight:   142 lb 13.7 oz (64.8 kg)   Height:  5\' 4"  (1.626 m)      Intake/Output Summary (Last 24 hours) at 06/13/2018 1407 Last data filed at 06/13/2018 1239 Gross per 24 hour  Intake 1618.57 ml  Output 100 ml  Net 1518.57 ml   Filed Weights   06/12/18 2321 06/13/18 0637  Weight: 142 lb 13.7 oz (64.8 kg) 142 lb 13.7 oz (64.8 kg)   Body mass index is 24.52 kg/m.   General: Elderly, NAD Skin: Warm, dry, intact  Head: Normocephalic, atraumatic, clear, moist mucus membranes. Neck: Negative for carotid bruits. No JVD Lungs: No wheezes.  Mild rales. Breathing is unlabored. Cardiovascular: Irregularly irregular with S1 S2. No murmurs, rubs or gallops Abdomen: Soft, non-tender, non-distended with hyper active bowel sounds.  No obvious abdominal masses.  Frequent diarrhea MSK: Strength and tone appear normal for age. 5/5 in all extremities Extremities: No edema. No clubbing or cyanosis. DP/PT pulses 2+ bilaterally Neuro: Alert and oriented. No focal deficits. No facial asymmetry. MAE spontaneously. Psych: Responds to questions appropriately with flat affect.     EKG:  Chelsea EKG was personally reviewed and demonstrates: 06/13/18 Atrial fibrillation with HR 130. No acute ischemic changes.  Telemetry:  Telemetry was personally  reviewed and demonstrates: 06/13/2018 atrial fibrillation, HR 95-125bpm  Relevant CV Studies:  ECHO: Echocardiogram 05/22/18: Study Conclusions  - Left ventricle: Chelsea cavity size was normal. Wall thickness was   increased in a pattern of mild LVH. Systolic function was mildly   to moderately reduced. Chelsea estimated ejection fraction was in Chelsea   range of 40% to 45%. Global hypokinesis with incoordinate septal   motion. Chelsea study is not technically sufficient to allow   evaluation of LV diastolic function.  - Mitral valve: Mildly thickened leaflets . There was trivial   regurgitation. - Left atrium: Chelsea atrium was normal in size. - Tricuspid valve: There was trivial regurgitation. - Pulmonary arteries: PA peak pressure: 25 mm Hg (S). - Inferior vena cava: Chelsea vessel was normal in size. Chelsea   respirophasic diameter changes were in Chelsea normal range (>= 50%),   consistent with normal central venous pressure.  Impressions:  - LVEF 40-45%, global hypokinesis, mild LVH, incoordinate septal   motion, trivial MR, normal LA size, trivial Tr, RVSP 25 mmHg,   normal IVC.  CATH: None   Laboratory Data:  Chemistry Recent Labs  Lab 06/09/18 1507 06/12/18 1528 06/13/18 0521  NA 138 136 137  K 3.2* 2.7* 3.6  CL 103 98 104  CO2 22 24 20*  GLUCOSE 122* 194* 157*  BUN 9 12 9   CREATININE 1.14* 1.24* 0.99  CALCIUM 9.0 8.7* 8.0*  GFRNONAA 43* 39* 51*  GFRAA 50* 45* 59*  ANIONGAP 13 14 13     Total Protein  Date Value Ref Range Status  06/13/2018 6.4 (L) 6.5 - 8.1 g/dL Final   Albumin  Date Value Ref Range Status  06/13/2018 2.5 (L) 3.5 - 5.0 g/dL Final   AST  Date Value Ref Range Status  06/13/2018 17 15 - 41 U/L Final   ALT  Date Value  Ref Range Status  06/13/2018 21 0 - 44 U/L Final    Comment:    Please note change in reference range.   Alkaline Phosphatase  Date Value Ref Range Status  06/13/2018 43 38 - 126 U/L Final   Total Bilirubin  Date Value Ref Range  Status  06/13/2018 0.7 0.3 - 1.2 mg/dL Final   Hematology Recent Labs  Lab 06/09/18 1507 06/12/18 1528 06/13/18 0521  WBC 11.6* 16.9* 16.8*  RBC 4.35 4.35 4.05  HGB 12.2 12.0 11.4*  HCT 39.8 39.0 36.5  MCV 91.5 89.7 90.1  MCH 28.0 27.6 28.1  MCHC 30.7 30.8 31.2  RDW 14.9 14.6 14.8  PLT 214 225 197   Cardiac Enzymes Recent Labs  Lab 06/12/18 1903 06/13/18 0013 06/13/18 0521 06/13/18 0950  TROPONINI 0.10* 0.06* 0.04* 0.03*   No results for input(s): TROPIPOC in [place] last 168 hours.  BNP Recent Labs  Lab 06/12/18 1903  BNP 189.5*    DDimer No results for input(s): DDIMER in [place] last 168 hours. TSH:  Lab Results  Component Value Date   TSH 1.606 06/13/2018   Lipids: Lab Results  Component Value Date   CHOL 99 06/13/2018   HDL 33 (L) 06/13/2018   LDLCALC 53 06/13/2018   TRIG 63 06/13/2018   CHOLHDL 3.0 06/13/2018   HgbA1c: Lab Results  Component Value Date   HGBA1C 7.2 (H) 06/04/2018    Radiology/Studies:  Dg Chest Portable 1 View  Result Date: 06/12/2018 CLINICAL DATA:  Weakness EXAM: PORTABLE CHEST 1 VIEW COMPARISON:  05/21/2018 FINDINGS: Borderline heart size, stable. Mild aortic tortuosity. Interstitial coarsening at Chelsea bases is stable from 05/15/2018 when there was no acute disease in Chelsea lower chest by abdominal CT. No effusion or pneumothorax. IMPRESSION: Stable from prior.  No evidence of acute disease. Electronically Signed   By: Marnee Spring M.D.   On: 06/12/2018 19:06   Assessment and Plan:   1.  New onset atrial fibrillation with RVR: -Pt presented to [facility] on [facility]/13/18 with c/o of worsening fatigue and weakness over Chelsea last several days. Last echocardiogram from 05/23/18 with LVEF of 45% and global hypokinesis with incoordinate septal motion>>> however Ward reports she recently moved from [place] to [place] area and was followed by cardiologist there for heart failure.  She reports no history of atrial fibrillation in [place] past. -AF  likely in [place] setting of acute illness with elevated WBC, colitis with C. difficile positive toxin -EKG, atrial fibrillation with HR 130 with no acute ischemic changes however will spike into Chelsea 120s when agitated or with movement. -BP 125/68, 123/84, 128/81 -Ward has no history of GI bleed or stroke. Will need to be anticoagulated given her elevated CHA2DS2VASc score  -Diltiazem gtt currently at 10 mg/hr, with moderate HR control.  Heart rate in Chelsea 90s while resting  -Heparin per pharmacy for now  -Not currently on any AV blocking agents. Held after last admission secondary to bradycardia while in [facility].  -CHA2DS2VASc =6 (age, sex, CHF hx, HTN, DM2)  2. Elevated troponin: -Coronary history is unknown however per chart review Ward history, it appears that she has been followed in [place] for acute on chronic CHF -Trop elevated at 0.06, 0.04, 0.03>>> likely demand ischemia in [place] setting of atrial fibrillation with RVR -EKG with atrial fibrillation and no acute ischemic changes  -Continue to trend, likely no ischemic evaluation needed given that she has no complaints of chest pain or other associated ACS symptoms -Will attempt to  obtain records from [facility] cardiologist -Continue ASA, Zetia  3.  Acute diarrhea: -Pt recently admitted and treated for acute colitis and found to be C.Diff toxin positive -Per primary team  4. DM2: -Continue Januvia>>>Tradjenta -SSI for glucose control while inpatient  -HbA1C, 7.1 on 06/04/18  5. Acute on chronic systolic CHF: -Echocardiogram performed during last hospitalization revealed LVEF of 45% with global hypokinesis and incoordinate septal motion, mild LVH, trivial MR, trivial TR and  -Apparently she has been followed iin [facility] many times for this although there are no records in our system and she had very poor health literacy   6. CKD stage III: -Creatinine, 0.99 -Baseline appears to be in Chelsea 1.0-1.1 range   For questions or  updates, please contact [facility] Please consult [email or web address] for contact info under Cardiology/STEMI.   Raliegh Ip NP-C [facility] Pager: [phone] 06/13/2018 2:07 PM  Attending Note:   Chelsea Ward was seen and examined.  Agree with assessment and plan as noted above.  Changes made to Chelsea above note as needed.  Ward seen and independently examined with  Georgie Chard, NP .   [name] all aspects of Chelsea encounter. I agree with Chelsea assessment and plan as stated above.  1.  Atrial fibrillation: Ward developed atrial fibrillation in [place] setting of C. difficile colitis.  She is been on IV Cardizem.  She has converted back to normal sinus rhythm.  She still has premature atrial contractions and PVCs. -CHA2DS2VASc =6 (age, sex, CHF hx, HTN, DM2) She is currently on heparin drip. Will likely transition to Eliquis 5 mg BID ( age is 70,   Wt is 64.5 kg.   renal function is normal ) once she is more stable from a C. difficile standpoint.  She has converted.  Will get a repeat ECG  2.  Elevated troponin level: Her troponin levels are very minimally elevated and Chelsea trend is flat.  These are not consistent with an acute coronary syndrome.  She is pain-free.  I do not think that she needs additional work-up at this time.     I have spent a total of 40 minutes with Ward reviewing hospital  notes , telemetry, EKGs, labs and examining Ward as well as establishing an assessment and plan that was discussed with Chelsea Ward. > 50% of time was spent in direct Ward care.    Vesta Mixer, Montez Hageman., MD, [facility] 06/13/2018, 4:32 PM [address],  [address] Office [phone] Pager [phone]

## 2018-06-13 NOTE — Progress Notes (Signed)
Initial Nutrition Assessment  DOCUMENTATION CODES:   Not applicable  INTERVENTION:   -D/c Ensure Enlive po BID, each supplement provides 350 kcal and 20 grams of protein -1 packet Juven BID, each packet provides 80 calories, 8 grams of carbohydrate, and 14 grams of amino acids; supplement contains CaHMB, glutamine, and arginine, to promote wound healing  NUTRITION DIAGNOSIS:   Increased nutrient needs related to wound healing as evidenced by estimated needs.  GOAL:   Patient will meet greater than or equal to 90% of their needs  MONITOR:   PO intake, Supplement acceptance, Labs, Weight trends, Skin, I & O's  REASON FOR ASSESSMENT:   Malnutrition Screening Tool    ASSESSMENT:    Chelsea Ward  is a 84 y.o. female, w hypertension, hyperlipidemia, dm2, CAD , recent episodes of colitis apparently presented from home with c/o generalized weakness and bs of 302 per pt. Pt notes that she is having some diarrhea still.   Pt admitted with a-fib with RVR.   Spoke with pt, who was very drowsy at time of visit. She reports she had a good appetite at home. She was unable to answer any more of this RD's questions, despite prompting, due to pt continuing to fall asleep during interview.   Reviewed wt hx; noted wt has been stable.   Pt with increased nutritional needs related to wound healing; would benefit from addition of oral nutrition supplements.  Last Hgb A1c: 7.2 (06/04/18), which indicates good control, especially for patients of advanced age. PTA DM medications 100 mg [place]uvia daily.   Labs reviewed: CBGS: 161-169 (inpatient orders for glycemic control are 5 mg linagliptin).   NUTRITION - FOCUSED PHYSICAL EXAM:    Most Recent Value  Orbital Region  Mild depletion  Upper Arm Region  No depletion  Thoracic and Lumbar Region  No depletion  Buccal Region  No depletion  Temple Region  Mild depletion  Clavicle Bone Region  Mild depletion  Clavicle and Acromion Bone Region  No  depletion  Scapular Bone Region  No depletion  Dorsal Hand  Mild depletion  Patellar Region  No depletion  Anterior Thigh Region  No depletion  Posterior Calf Region  No depletion  Edema (RD Assessment)  None  Hair  Reviewed  Eyes  Reviewed  Mouth  Reviewed  Skin  Reviewed  Nails  Reviewed       Diet Order:   Diet Order           Diet heart healthy/carb modified Room service appropriate? Yes; Fluid consistency: Thin  Diet effective now          EDUCATION NEEDS:   No education needs have been identified at this time  Skin:  Skin Assessment: Skin Integrity Issues: Skin Integrity Issues:: Stage II Stage II: sacrum  Last BM:  06/13/18  Height:   Ht Readings from Last 1 Encounters:  06/13/18 5\' 4"  (1.626 m)    Weight:   Wt Readings from Last 1 Encounters:  06/13/18 142 lb 13.7 oz (64.8 kg)    Ideal Body Weight:  54.5 kg  BMI:  Body mass index is 24.52 kg/m.  Estimated Nutritional Needs:   Kcal:  1650-1850  Protein:  80-95 grams  Fluid:  >1.6 L    See Beharry A. Mayford KnifeWilliams, RD, LDN, CDE Pager: [phone] After hours Pager: [phone]

## 2018-06-13 NOTE — Progress Notes (Signed)
   06/13/18 1100  Clinical Encounter Type  Visited With Patient  Visit Type Initial  Referral From Nurse  Consult/Referral To Chaplain  Spiritual Encounters  Spiritual Needs Prayer;Emotional  Stress Factors  Patient Stress Factors Exhausted    Pt was alone and seemingly trying to catch sleep when I arrived this morning. No family member on-site. Pt was very receptive and asked for prayer. Chaplain provided compassionate presence and prayer.  Mackinsey Pelland a Water quality scientistMusiko-Holley, E. I. du PontChaplain

## 2018-06-13 NOTE — Progress Notes (Signed)
ANTICOAGULATION CONSULT NOTE - Follow-Up Consult  Pharmacy Consult for heparin Indication: atrial fibrillation  No Known Allergies  Patient Measurements: Height: 5\' 4"  (162.6 cm) Weight: 142 lb 13.7 oz (64.8 kg) IBW/kg (Calculated) : 54.7 Heparin Dosing Weight: 63.5kg  Vital Signs: Temp: 98.7 F (37.1 C) (07/10 0637) Temp Source: Oral (07/10 0637) BP: 117/64 (07/10 0637) Pulse Rate: 78 (07/10 0637)  Labs: Recent Labs    06/12/18 1528 06/12/18 1903 06/13/18 0013 06/13/18 0521  HGB 12.0  --   --  11.4*  HCT 39.0  --   --  36.5  PLT 225  --   --  197  HEPARINUNFRC  --   --   --  0.50  CREATININE 1.24*  --   --   --   TROPONINI  --  0.10* 0.06*  --     Estimated Creatinine Clearance: 29.7 mL/min (A) (by C-G formula based on SCr of 1.24 mg/dL (H)).  Medications:  Infusions:  . 0.9 % NaCl with KCl 20 mEq / L 75 mL/hr at 06/13/18 0600  . diltiazem (CARDIZEM) infusion 7.5 mg/hr (06/13/18 0600)  . heparin 900 Units/hr (06/13/18 0600)    Assessment: [age] YOF presented to the ED with hyperglycemia found to be in new onset afib. To start IV heparin for anticoagulation.   Heparin level this morning is therapeutic (HL 0.5, goal of 0.3-0.7). CBC stable - no bleeding noted.   Goal of Therapy:  Heparin level 0.3-0.7 units/ml Monitor platelets by anticoagulation protocol: Yes   Plan:  - Continue Heparin at 900 units/hr (9 ml/hr) - Will continue to monitor for any signs/symptoms of bleeding and will follow up with heparin level in 8 hours to confirm therapeutic   Thank you for allowing pharmacy to be a part of this patient's care.  Georgina PillionElizabeth Kiannah Grunow, PharmD, BCPS Clinical Pharmacist Pager: [phone] If after 3:30p, please call main pharmacy at: [phone] 06/13/2018 6:58 AM

## 2018-06-13 NOTE — Plan of Care (Signed)
  Problem: Activity: Goal: Risk for activity intolerance will decrease Outcome: Progressing   Problem: Nutrition: Goal: Adequate nutrition will be maintained Outcome: Progressing   Problem: Education: Goal: Knowledge of General Education information will improve Outcome: Completed/Met

## 2018-06-14 DIAGNOSIS — I5043 Acute on chronic combined systolic (congestive) and diastolic (congestive) heart failure: Secondary | ICD-10-CM

## 2018-06-14 LAB — GLUCOSE, CAPILLARY
GLUCOSE-CAPILLARY: 166 mg/dL — AB (ref 70–99)
GLUCOSE-CAPILLARY: 175 mg/dL — AB (ref 70–99)
Glucose-Capillary: 145 mg/dL — ABNORMAL HIGH (ref 70–99)
Glucose-Capillary: 153 mg/dL — ABNORMAL HIGH (ref 70–99)

## 2018-06-14 LAB — BASIC METABOLIC PANEL
ANION GAP: 11 (ref 5–15)
BUN: 13 mg/dL (ref 8–23)
CALCIUM: 7.8 mg/dL — AB (ref 8.9–10.3)
CO2: 22 mmol/L (ref 22–32)
CREATININE: 1.01 mg/dL — AB (ref 0.44–1.00)
Chloride: 100 mmol/L (ref 98–111)
GFR, EST AFRICAN AMERICAN: 58 mL/min — AB (ref >60)
GFR, EST NON AFRICAN AMERICAN: 50 mL/min — AB (ref >60)
Glucose, Bld: 154 mg/dL — ABNORMAL HIGH (ref 70–99)
Potassium: 2.7 mmol/L — CL (ref 3.5–5.1)
SODIUM: 133 mmol/L — AB (ref 135–145)

## 2018-06-14 LAB — CBC
HCT: 33.2 % — ABNORMAL LOW (ref 36.0–46.0)
Hemoglobin: 10.7 g/dL — ABNORMAL LOW (ref 12.0–15.0)
MCH: 28.5 pg (ref 26.0–34.0)
MCHC: 32.2 g/dL (ref 30.0–36.0)
MCV: 88.3 fL (ref 78.0–100.0)
PLATELETS: 210 10*3/uL (ref 150–400)
RBC: 3.76 MIL/uL — ABNORMAL LOW (ref 3.87–5.11)
RDW: 14.9 % (ref 11.5–15.5)
WBC: 24.7 10*3/uL — AB (ref 4.0–10.5)

## 2018-06-14 LAB — HEPARIN LEVEL (UNFRACTIONATED): HEPARIN UNFRACTIONATED: 0.31 [IU]/mL (ref 0.30–0.70)

## 2018-06-14 MED ORDER — POTASSIUM CHLORIDE CRYS ER 20 MEQ PO TBCR
40.0000 meq | EXTENDED_RELEASE_TABLET | ORAL | Status: AC
  Administered 2018-06-14 (×2): 40 meq via ORAL
  Filled 2018-06-14 (×2): qty 2

## 2018-06-14 MED ORDER — POTASSIUM CHLORIDE CRYS ER 20 MEQ PO TBCR
40.0000 meq | EXTENDED_RELEASE_TABLET | Freq: Once | ORAL | Status: AC
Start: 2018-06-14 — End: 2018-06-14
  Administered 2018-06-14: 40 meq via ORAL
  Filled 2018-06-14: qty 2

## 2018-06-14 NOTE — Plan of Care (Signed)
  Problem: Coping: Goal: Level of anxiety will decrease Outcome: Completed/Met

## 2018-06-14 NOTE — Progress Notes (Addendum)
[facility]  PROGRESS NOTE  Chelsea Ward GNF:[id number] DOB: Jul 11, 1933 DOA: 06/12/2018 PCP: Deeann Saint, MD   Brief HPI:   84 year old female with history of hypertension, hyperlipidemia, diabetes mellitus, CAD, recent episodes of colitis came from home with complaints of generalized weakness and hypoglycemia with blood glucose 302.  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  Started on vancomycin. Also patient found to have new onset A. fib started on IV heparin, IV Cardizem   Subjective   Patient seen and examined, continues to have diarrhea.  Denies shortness of breath   Assessment/Plan:     1. Atrial fibrillation with RVR-new onset, continue IV heparin, Cardizem.   Patient had echocardiogram in June 2019, will not repeat echo at this time.   2. DiarrheaContinues to have diarrhea,-patient has positive C. difficile toxin as well as C. difficile antigen.  Will start vancomycin 125 mg 4 times a day for 2 weeks. 3. Diabetes mellitus-blood glucose well controlled, will discontinue oral hypoglycemic agents.  Start sliding scale insulin with NovoLog. 4. Elevated troponin-mild elevation of troponin, trending downwards.  Likely from demand ischemia from A. fib with RVR. 5. Hypokalemia-potassium was 2.7, will replace potassium and check BMP in a.m.    DVT prophylaxis: Heparin  Code Status: Full code  Family Communication: Discussed with patient's son at bedside  Disposition Plan: likely home when medically ready for discharge   Consultants:  None  Procedures:  None   Antibiotics:   Anti-infectives (From admission, onward)   Start     Dose/Rate Route Frequency Ordered Stop   06/13/18 1400  vancomycin (VANCOCIN) 50 mg/mL oral solution 125 mg     125 mg Oral Every 6 hours 06/13/18 1357 06/27/18 1159       Objective   Vitals:   06/13/18 2019 06/14/18 0024 06/14/18 0450 06/14/18 0836  BP: 132/66 115/71 131/67 (!) 111/56  Pulse: 86 (!) 59 76   Resp: (!)  22 17 16    Temp: 99.5 F (37.5 C) 98.9 F (37.2 C) 97.9 F (36.6 C) (!) 97.2 F (36.2 C)  TempSrc: Oral Oral Oral Oral  SpO2: 99% 98% 98%   Weight:   65 kg (143 lb 4.8 oz)   Height:        Intake/Output Summary (Last 24 hours) at 06/14/2018 1409 Last data filed at 06/14/2018 1145 Gross per 24 hour  Intake 582.46 ml  Output 250 ml  Net 332.46 ml   Filed Weights   06/12/18 2321 06/13/18 0637 06/14/18 0450  Weight: 64.8 kg (142 lb 13.7 oz) 64.8 kg (142 lb 13.7 oz) 65 kg (143 lb 4.8 oz)     Physical Examination:    Lungs: Normal respiratory effort, bilateral clear to auscultation, no crackles or wheezes.  Heart: Irregular rhythm, S1 and S2 normal, no murmurs, rubs auscultated Abdomen: BS normoactive,soft,nondistended,non-tender to palpation,no organomegaly Extremities: No pretibial edema, no erythema, no cyanosis, no clubbing Neuro : Alert and oriented to time, place and person, No focal deficits      Data Reviewed: I have personally reviewed following labs and imaging studies  CBG: Recent Labs  Lab 06/13/18 1132 06/13/18 1646 06/13/18 2239 06/14/18 0748 06/14/18 1120  GLUCAP 169* 173* 161* 166* 175*    CBC: Recent Labs  Lab 06/09/18 1507 06/12/18 1528 06/13/18 0521 06/14/18 0526  WBC 11.6* 16.9* 16.8* 24.7*  HGB 12.2 12.0 11.4* 10.7*  HCT 39.8 39.0 36.5 33.2*  MCV 91.5 89.7 90.1 88.3  PLT 214 225 197 210    Basic  Metabolic Panel: Recent Labs  Lab 06/09/18 1507 06/12/18 1528 06/13/18 0521 06/14/18 0526  NA 138 136 137 133*  K 3.2* 2.7* 3.6 2.7*  CL 103 98 104 100  CO2 22 24 20* 22  GLUCOSE 122* 194* 157* 154*  BUN 9 12 9 13   CREATININE 1.14* 1.24* 0.99 1.01*  CALCIUM 9.0 8.7* 8.0* 7.8*    Recent Results (from the past 240 hour(s))  MRSA PCR Screening     Status: None   Collection Time: 06/12/18 11:27 PM  Result Value Ref Range Status   MRSA by PCR NEGATIVE NEGATIVE Final    Comment:        The GeneXpert MRSA Assay (FDA approved for  NASAL specimens only), is one component of a comprehensive MRSA colonization surveillance program. It is not intended to diagnose MRSA infection nor to guide or monitor treatment for MRSA infections. Performed at [facility], [address] [place], [place] [address]   Gastrointestinal Panel by PCR , Stool     Status: Abnormal   Collection Time: 06/13/18  3:25 AM  Result Value Ref Range Status   Campylobacter species NOT DETECTED NOT DETECTED Final   Plesimonas shigelloides NOT DETECTED NOT DETECTED Final   Salmonella species NOT DETECTED NOT DETECTED Final   Yersinia enterocolitica NOT DETECTED NOT DETECTED Final   Vibrio species NOT DETECTED NOT DETECTED Final   Vibrio cholerae NOT DETECTED NOT DETECTED Final   Enteroaggregative E coli (EAEC) DETECTED (A) NOT DETECTED Final    Comment: RESULT CALLED TO, READ BACK BY AND VERIFIED WITH: TINA TOREVILLE @1418  06/13/18 AKT    Enteropathogenic E coli (EPEC) DETECTED (A) NOT DETECTED Final    Comment: RESULT CALLED TO, READ BACK BY AND VERIFIED WITH: TINA TOREVILLE @1418  06/13/18 AKT    Enterotoxigenic E coli (ETEC) NOT DETECTED NOT DETECTED Final   Shiga like toxin producing E coli (STEC) NOT DETECTED NOT DETECTED Final   Shigella/Enteroinvasive E coli (EIEC) NOT DETECTED NOT DETECTED Final   Cryptosporidium NOT DETECTED NOT DETECTED Final   Cyclospora cayetanensis NOT DETECTED NOT DETECTED Final   Entamoeba histolytica NOT DETECTED NOT DETECTED Final   Giardia lamblia NOT DETECTED NOT DETECTED Final   Adenovirus F40/41 NOT DETECTED NOT DETECTED Final   Astrovirus NOT DETECTED NOT DETECTED Final   Norovirus GI/GII NOT DETECTED NOT DETECTED Final   Rotavirus A NOT DETECTED NOT DETECTED Final   Sapovirus (I, II, IV, and V) NOT DETECTED NOT DETECTED Final    Comment: Performed at [facility], [address] [place], [place] [address]  C difficile quick scan w PCR reflex     Status: Abnormal   Collection Time:  06/13/18  3:25 AM  Result Value Ref Range Status   C Diff antigen POSITIVE (A) NEGATIVE Final   C Diff toxin POSITIVE (A) NEGATIVE Final   C Diff interpretation Toxin producing C. difficile detected.  Final    Comment: CRITICAL RESULT CALLED TO, READ BACK BY AND VERIFIED WITH: T.TOURVILLE RN AT [phone] 06/13/18 BY A.DAVIS Performed at [facility], [address] [place], [place] [address]      Liver Function Tests: Recent Labs  Lab 06/09/18 1507 06/13/18 0521  AST 24 17  ALT 36 21  ALKPHOS 42 43  BILITOT 0.9 0.7  PROT 7.9 6.4*  ALBUMIN 3.4* 2.5*   Recent Labs  Lab 06/09/18 1507  LIPASE 30   No results for input(s): AMMONIA in the last 168 hours.  Cardiac  Enzymes: Recent Labs  Lab 06/12/18 1903 06/13/18 0013 06/13/18 0521 06/13/18 0950  TROPONINI 0.10* 0.06* 0.04* 0.03*   BNP (last 3 results) Recent Labs    06/12/18 1903  BNP 189.5*    ProBNP (last 3 results) No results for input(s): PROBNP in the last 8760 hours.    Studies: Dg Chest Portable 1 View  Result Date: 06/12/2018 CLINICAL DATA:  Weakness EXAM: PORTABLE CHEST 1 VIEW COMPARISON:  05/21/2018 FINDINGS: Borderline heart size, stable. Mild aortic tortuosity. Interstitial coarsening at the bases is stable from 05/15/2018 when there was no acute disease in the lower chest by abdominal CT. No effusion or pneumothorax. IMPRESSION: Stable from prior.  No evidence of acute disease. Electronically Signed   By: Marnee SpringJonathon  Watts M.D.   On: 06/12/2018 19:06    Scheduled Meds: . aspirin EC  81 mg Oral Daily  . ezetimibe  10 mg Oral Daily  . furosemide  20 mg Oral Daily  . insulin aspart  0-9 Units Subcutaneous TID WC  . loratadine  10 mg Oral Daily  . nutrition supplement (JUVEN)  1 packet Oral BID BM  . saccharomyces boulardii  250 mg Oral BID  . vancomycin  125 mg Oral Q6H      Time spent: 25 min  Meredeth IdeGagan S Lama   [facility] Pager [phone]. If 7PM-7AM, please contact night-coverage at  [email or web address], Office  [phone]  password [email or web address]  06/14/2018, 2:09 PM  LOS: 2 days

## 2018-06-14 NOTE — Progress Notes (Signed)
ANTICOAGULATION CONSULT NOTE - Follow-Up Consult  Pharmacy Consult for heparin Indication: atrial fibrillation  No Known Allergies  Patient Measurements: Height: 5\' 4"  (162.6 cm) Weight: 143 lb 4.8 oz (65 kg) IBW/kg (Calculated) : 54.7 Heparin Dosing Weight: 63.5kg  Vital Signs: Temp: 97.2 F (36.2 C) (07/11 0836) Temp Source: Oral (07/11 0836) BP: 111/56 (07/11 0836) Pulse Rate: 76 (07/11 0450)  Labs: Recent Labs    06/12/18 1528  06/13/18 0013 06/13/18 0521 06/13/18 0950 06/13/18 1330 06/14/18 0526  HGB 12.0  --   --  11.4*  --   --  10.7*  HCT 39.0  --   --  36.5  --   --  33.2*  PLT 225  --   --  197  --   --  210  HEPARINUNFRC  --   --   --  0.50  --  0.55 0.31  CREATININE 1.24*  --   --  0.99  --   --  1.01*  TROPONINI  --    < > 0.06* 0.04* 0.03*  --   --    < > = values in this interval not displayed.    Estimated Creatinine Clearance: 36.4 mL/min (A) (by C-G formula based on SCr of 1.01 mg/dL (H)).  Medications:  Infusions:   diltiazem (CARDIZEM) infusion 10 mg/hr (06/13/18 2321)   heparin 900 Units/hr (06/13/18 2321)    Assessment: [age] YOF presented to the ED with hyperglycemia found to be in new onset afib. On IV heparin for anticoagulation. Will f/u switch to PO anticoagulation with eliquis.  Heparin level therapeutic this morning 0.31  Goal of Therapy:  Heparin level 0.3-0.7 units/ml Monitor platelets by anticoagulation protocol: Yes   Plan:  - Continue Heparin at 900 units/hr (9 ml/hr) - Follow up daily heparin level, CBC  Thank you   Lenward Chancelloranya Makhlouf, PharmD PGY1 Pharmacy Resident Phone [phone] 06/14/2018  8:47 AM

## 2018-06-14 NOTE — Progress Notes (Addendum)
Progress Note  Patient Name: Chelsea Ward Date of Encounter: 06/14/2018  Primary Cardiologist: Kristeen MissPhilip Kenshawn Maciolek, MD   Subjective   Pt denies chest pain, reports occasional palpitations. Her main complaint is abdominal.  Inpatient Medications    Scheduled Meds: . aspirin EC  81 mg Oral Daily  . ezetimibe  10 mg Oral Daily  . furosemide  20 mg Oral Daily  . insulin aspart  0-9 Units Subcutaneous TID WC  . loratadine  10 mg Oral Daily  . nutrition supplement (JUVEN)  1 packet Oral BID BM  . potassium chloride  40 mEq Oral Q4H  . saccharomyces boulardii  250 mg Oral BID  . vancomycin  125 mg Oral Q6H   Continuous Infusions: . diltiazem (CARDIZEM) infusion 10 mg/hr (06/13/18 2321)  . heparin 900 Units/hr (06/13/18 2321)   PRN Meds: acetaminophen **OR** acetaminophen, cholestyramine light, polyvinyl alcohol   Vital Signs    Vitals:   06/13/18 1309 06/13/18 2019 06/14/18 0024 06/14/18 0450  BP: 125/68 132/66 115/71 131/67  Pulse:  86 (!) 59 76  Resp: (!) 21 (!) 22 17 16   Temp:  99.5 F (37.5 C) 98.9 F (37.2 C) 97.9 F (36.6 C)  TempSrc:  Oral Oral Oral  SpO2:  99% 98% 98%  Weight:    143 lb 4.8 oz (65 kg)  Height:        Intake/Output Summary (Last 24 hours) at 06/14/2018 0811 Last data filed at 06/13/2018 2321 Gross per 24 hour  Intake 1039.76 ml  Output 100 ml  Net 939.76 ml   Filed Weights   06/12/18 2321 06/13/18 0637 06/14/18 0450  Weight: 142 lb 13.7 oz (64.8 kg) 142 lb 13.7 oz (64.8 kg) 143 lb 4.8 oz (65 kg)    Telemetry    Sinus but frequent PACs, PVCs - Personally Reviewed  ECG    Sinus, blocked PACs - Personally Reviewed  Physical Exam   GEN: No acute distress.   Neck: No JVD Cardiac: RRR, no murmurs, rubs, or gallops.  Respiratory: respirations unlabored, diminished in bases GI: Soft, + tender, non-distended  MS: No edema; No deformity. Neuro:  Nonfocal  Psych: Normal affect   Labs    Chemistry Recent Labs  Lab 06/09/18 1507  06/12/18 1528 06/13/18 0521 06/14/18 0526  NA 138 136 137 133*  K 3.2* 2.7* 3.6 2.7*  CL 103 98 104 100  CO2 22 24 20* 22  GLUCOSE 122* 194* 157* 154*  BUN 9 12 9 13   CREATININE 1.14* 1.24* 0.99 1.01*  CALCIUM 9.0 8.7* 8.0* 7.8*  PROT 7.9  --  6.4*  --   ALBUMIN 3.4*  --  2.5*  --   AST 24  --  17  --   ALT 36  --  21  --   ALKPHOS 42  --  43  --   BILITOT 0.9  --  0.7  --   GFRNONAA 43* 39* 51* 50*  GFRAA 50* 45* 59* 58*  ANIONGAP 13 14 13 11      Hematology Recent Labs  Lab 06/12/18 1528 06/13/18 0521 06/14/18 0526  WBC 16.9* 16.8* 24.7*  RBC 4.35 4.05 3.76*  HGB 12.0 11.4* 10.7*  HCT 39.0 36.5 33.2*  MCV 89.7 90.1 88.3  MCH 27.6 28.1 28.5  MCHC 30.8 31.2 32.2  RDW 14.6 14.8 14.9  PLT 225 197 210    Cardiac Enzymes Recent Labs  Lab 06/12/18 1903 06/13/18 0013 06/13/18 0521 06/13/18 0950  TROPONINI 0.10* 0.06* 0.04*  0.03*   No results for input(s): TROPIPOC in the last 168 hours.   BNP Recent Labs  Lab 06/12/18 1903  BNP 189.5*     DDimer No results for input(s): DDIMER in the last 168 hours.   Radiology    Dg Chest Portable 1 View  Result Date: 06/12/2018 CLINICAL DATA:  Weakness EXAM: PORTABLE CHEST 1 VIEW COMPARISON:  05/21/2018 FINDINGS: Borderline heart size, stable. Mild aortic tortuosity. Interstitial coarsening at the bases is stable from 05/15/2018 when there was no acute disease in the lower chest by abdominal CT. No effusion or pneumothorax. IMPRESSION: Stable from prior.  No evidence of acute disease. Electronically Signed   By: Marnee Spring M.D.   On: 06/12/2018 19:06    Cardiac Studies   Echo 05/22/18: Study Conclusions - Left ventricle: The cavity size was normal. Wall thickness was   increased in a pattern of mild LVH. Systolic function was mildly   to moderately reduced. The estimated ejection fraction was in the   range of 40% to 45%. Global hypokinesis with incoordinate septal   motion. The study is not technically  sufficient to allow   evaluation of LV diastolic function. - Mitral valve: Mildly thickened leaflets . There was trivial   regurgitation. - Left atrium: The atrium was normal in size. - Tricuspid valve: There was trivial regurgitation. - Pulmonary arteries: PA peak pressure: 25 mm Hg (S). - Inferior vena cava: The vessel was normal in size. The   respirophasic diameter changes were in the normal range (>= 50%),   consistent with normal central venous pressure.  Impressions: - LVEF 40-45%, global hypokinesis, mild LVH, incoordinate septal   motion, trivial MR, normal LA size, trivial Tr, RVSP 25 mmHg,   normal IVC.  Patient Profile     84 y.o. female with a hx of HTN, HLD, DM2, heart failure (unknown baseline) and recent colitis exacerbation who is being followed for new onset atrial fibrillation.   Assessment & Plan    1. New onset atrial fibrillation with RVR - in the setting of colitis, Cdiff, and elevated WBC - home lopressor held after recent admission for bradycardia - This patients CHA2DS2-VASc Score and unadjusted Ischemic Stroke Rate (% per year) is equal to 9.7 % stroke rate/year from a score of 6 (CHF, age, female, HTN, DM) - continue heparin drip, transition to eliquis 5 mg BID if no procedures planned - she converted to NSR on cardizem drip running at 10 mg/hr  - TSH normal - HR is in the 100s, likely related to her infection - given her reduced EF, cardizem is not the best long-term medication. Continue IV cardizem for now, transition back to lopressor as heart rate improves    2. Hypokalemia - K 2.7 today - replace per primary team   3. Elevated troponin - likely elevated secondary to illness and RVR - troponin 0.10 --> 0.06 --> 0.04 --> 0.03 - trend is mild and flat, inconsistent with ACS - she denies chest pain - will defer ischemic evaluation for now     For questions or updates, please contact [facility] Please consult [email or web address] for contact  info under Cardiology/STEMI.      Signed, Chelsea Rutherford Duke, PA  06/14/2018, 8:11 AM     Attending Note:   The patient was seen and examined.  Agree with assessment and plan as noted above.  Changes made to the above note as needed.  Patient seen and independently examined with Chelsea Gavia, PA .  [name] all aspects of the encounter. I agree with the assessment and plan as stated above.  Paroxysmal atrial fibrillation: Patient has converted to sinus rhythm. Having sinus tach and lots of PACs  Will start metoprolol for better rate control Will transition her from Diltiazem to metoprolol   To start eliquis once we know that she will not need any additional procedures   I have spent a total of 40 minutes with patient reviewing hospital  notes , telemetry, EKGs, labs and examining patient as well as establishing an assessment and plan that was discussed with the patient. > 50% of time was spent in direct patient care.    Vesta Mixer, Montez Hageman., MD, [facility] 06/14/2018, 9:45 AM [address],  [address] Office [phone] Pager [phone]

## 2018-06-15 LAB — GLUCOSE, CAPILLARY
GLUCOSE-CAPILLARY: 181 mg/dL — AB (ref 70–99)
GLUCOSE-CAPILLARY: 132 mg/dL — AB (ref 70–99)
Glucose-Capillary: 180 mg/dL — ABNORMAL HIGH (ref 70–99)
Glucose-Capillary: 149 mg/dL — ABNORMAL HIGH (ref 70–99)

## 2018-06-15 LAB — CBC
HEMATOCRIT: 35 % — AB (ref 36.0–46.0)
HEMOGLOBIN: 11.2 g/dL — AB (ref 12.0–15.0)
MCH: 28.2 pg (ref 26.0–34.0)
MCHC: 32 g/dL (ref 30.0–36.0)
MCV: 88.2 fL (ref 78.0–100.0)
Platelets: 228 10*3/uL (ref 150–400)
RBC: 3.97 MIL/uL (ref 3.87–5.11)
RDW: 15.2 % (ref 11.5–15.5)
WBC: 27.7 10*3/uL — ABNORMAL HIGH (ref 4.0–10.5)

## 2018-06-15 LAB — BASIC METABOLIC PANEL
Anion gap: 13 (ref 5–15)
BUN: 26 mg/dL — AB (ref 8–23)
CO2: 21 mmol/L — AB (ref 22–32)
Calcium: 8.6 mg/dL — ABNORMAL LOW (ref 8.9–10.3)
Chloride: 100 mmol/L (ref 98–111)
Creatinine, Ser: 1.25 mg/dL — ABNORMAL HIGH (ref 0.44–1.00)
GFR calc Af Amer: 45 mL/min — ABNORMAL LOW (ref >60)
GFR calc non Af Amer: 39 mL/min — ABNORMAL LOW (ref >60)
GLUCOSE: 165 mg/dL — AB (ref 70–99)
POTASSIUM: 4.7 mmol/L (ref 3.5–5.1)
Sodium: 134 mmol/L — ABNORMAL LOW (ref 135–145)

## 2018-06-15 LAB — HEPARIN LEVEL (UNFRACTIONATED): Heparin Unfractionated: 0.15 IU/mL — ABNORMAL LOW (ref 0.30–0.70)

## 2018-06-15 MED ORDER — ONDANSETRON HCL 4 MG/2ML IJ SOLN
4.0000 mg | Freq: Three times a day (TID) | INTRAMUSCULAR | Status: DC | PRN
Start: 2018-06-15 — End: 2018-06-21

## 2018-06-15 MED ORDER — APIXABAN 2.5 MG PO TABS
2.5000 mg | ORAL_TABLET | Freq: Two times a day (BID) | ORAL | Status: DC
  Administered 2018-06-15 – 2018-06-21 (×12): 2.5 mg via ORAL
  Filled 2018-06-15 (×13): qty 1

## 2018-06-15 MED ORDER — APIXABAN 2.5 MG PO TABS
2.5000 mg | ORAL_TABLET | Freq: Once | ORAL | Status: AC
  Administered 2018-06-15: 2.5 mg via ORAL

## 2018-06-15 MED ORDER — METOPROLOL TARTRATE 25 MG PO TABS
25.0000 mg | ORAL_TABLET | Freq: Two times a day (BID) | ORAL | Status: DC
  Administered 2018-06-15 – 2018-06-21 (×13): 25 mg via ORAL
  Filled 2018-06-15 (×14): qty 1

## 2018-06-15 NOTE — Progress Notes (Signed)
ANTICOAGULATION CONSULT NOTE - Follow-Up Consult  Pharmacy Consult for heparin>>apixaban  Indication: atrial fibrillation  No Known Allergies  Patient Measurements: Height: 5\' 4"  (162.6 cm) Weight: 142 lb 6.7 oz (64.6 kg) IBW/kg (Calculated) : 54.7 Heparin Dosing Weight: 63.5kg  Vital Signs: Temp: 99.1 F (37.3 C) (07/12 0758) Temp Source: Oral (07/12 0758) BP: 100/71 (07/12 1000) Pulse Rate: 78 (07/12 1000)  Labs: Recent Labs    06/13/18 0013  06/13/18 0521 06/13/18 0950 06/13/18 1330 06/14/18 0526 06/15/18 0334  HGB  --   --  11.4*  --   --  10.7* 11.2*  HCT  --   --  36.5  --   --  33.2* 35.0*  PLT  --   --  197  --   --  210 228  HEPARINUNFRC  --    < > 0.50  --  0.55 0.31 0.15*  CREATININE  --   --  0.99  --   --  1.01* 1.25*  TROPONINI 0.06*  --  0.04* 0.03*  --   --   --    < > = values in this interval not displayed.    Estimated Creatinine Clearance: 29.4 mL/min (A) (by C-G formula based on SCr of 1.25 mg/dL (H)).  Assessment: [age] YOF presented to the ED with hyperglycemia found to be in new onset afib. On IV heparin for anticoagulation.   Heparin infusion increased this morning for a subtherapeutic level. Decision made to transition to apixaban today. Given age>80, weight close to weight cut-off of 60 kg, despite Scr<1.5 (Scr 1.25), will start apixaban at reduced dose. No signs/symptoms of bleeding.   Goal of Therapy:  Monitor platelets by anticoagulation protocol: Yes   Plan:  - Discontinue heparin infusion - Start apixaban 2.5 mg twice daily - Monitor CBC, renal fx, and for s/sx of bleeding  Thanks for allowing pharmacy to be a part of this patient's care.  Girard CooterKimberly Perkins, PharmD Clinical Pharmacist  Pager: [phone] Phone: [phone] 06/15/2018  1:16 PM

## 2018-06-15 NOTE — Progress Notes (Signed)
Progress Note  Patient Name: Chelsea Ward Date of Encounter: 06/15/2018  Primary Cardiologist: Kristeen Miss, MD   Subjective   Pt denies chest pain, reports occasional palpitations. Her main complaint is abdominal.  She is remained in normal sinus rhythm.  Dc dilt today .   startt metorolol 25 BID   Inpatient Medications    Scheduled Meds: . aspirin EC  81 mg Oral Daily  . ezetimibe  10 mg Oral Daily  . furosemide  20 mg Oral Daily  . insulin aspart  0-9 Units Subcutaneous TID WC  . loratadine  10 mg Oral Daily  . nutrition supplement (JUVEN)  1 packet Oral BID BM  . saccharomyces boulardii  250 mg Oral BID  . vancomycin  125 mg Oral Q6H   Continuous Infusions: . diltiazem (CARDIZEM) infusion 10 mg/hr (06/15/18 0523)  . heparin 1,000 Units/hr (06/15/18 0527)   PRN Meds: acetaminophen **OR** acetaminophen, cholestyramine light, polyvinyl alcohol   Vital Signs    Vitals:   06/14/18 2100 06/15/18 0039 06/15/18 0512 06/15/18 0758  BP:    104/64  Pulse:    80  Resp:    19  Temp: 99.3 F (37.4 C) 98.3 F (36.8 C) 98.8 F (37.1 C) 99.1 F (37.3 C)  TempSrc: Oral Oral Oral Oral  SpO2:    98%  Weight:   142 lb 6.7 oz (64.6 kg)   Height:        Intake/Output Summary (Last 24 hours) at 06/15/2018 0816 Last data filed at 06/15/2018 0527 Gross per 24 hour  Intake 801.54 ml  Output 1250 ml  Net -448.46 ml   Filed Weights   06/13/18 0637 06/14/18 0450 06/15/18 0512  Weight: 142 lb 13.7 oz (64.8 kg) 143 lb 4.8 oz (65 kg) 142 lb 6.7 oz (64.6 kg)    Telemetry    Sinus but frequent PACs, PVCs - Personally Reviewed  ECG    Sinus, blocked PACs - Personally Reviewed  Physical Exam   Physical Exam: Blood pressure 104/64, pulse 80, temperature 99.1 F (37.3 C), temperature source Oral, resp. rate 19, height 5\' 4"  (1.626 m), weight 142 lb 6.7 oz (64.6 kg), SpO2 98 %.  GEN: Elderly, frail female, no acute distress HEENT: Normal NECK: No JVD; No carotid  bruits LYMPHATICS: No lymphadenopathy CARDIAC: Rate S1-S2.  Occasional premature beats. RESPIRATORY:  Clear to auscultation without rales, wheezing or rhonchi  ABDOMEN: Soft, non-tender, non-distended MUSCULOSKELETAL:  No edema; No deformity  SKIN: Warm and dry NEUROLOGIC:  Alert and oriented x 3   Labs    Chemistry Recent Labs  Lab 06/09/18 1507  06/13/18 0521 06/14/18 0526 06/15/18 0334  NA 138   < > 137 133* 134*  K 3.2*   < > 3.6 2.7* 4.7  CL 103   < > 104 100 100  CO2 22   < > 20* 22 21*  GLUCOSE 122*   < > 157* 154* 165*  BUN 9   < > 9 13 26*  CREATININE 1.14*   < > 0.99 1.01* 1.25*  CALCIUM 9.0   < > 8.0* 7.8* 8.6*  PROT 7.9  --  6.4*  --   --   ALBUMIN 3.4*  --  2.5*  --   --   AST 24  --  17  --   --   ALT 36  --  21  --   --   ALKPHOS 42  --  43  --   --  BILITOT 0.9  --  0.7  --   --   GFRNONAA 43*   < > 51* 50* 39*  GFRAA 50*   < > 59* 58* 45*  ANIONGAP 13   < > 13 11 13    < > = values in this interval not displayed.     Hematology Recent Labs  Lab 06/13/18 0521 06/14/18 0526 06/15/18 0334  WBC 16.8* 24.7* 27.7*  RBC 4.05 3.76* 3.97  HGB 11.4* 10.7* 11.2*  HCT 36.5 33.2* 35.0*  MCV 90.1 88.3 88.2  MCH 28.1 28.5 28.2  MCHC 31.2 32.2 32.0  RDW 14.8 14.9 15.2  PLT 197 210 228    Cardiac Enzymes Recent Labs  Lab 06/12/18 1903 06/13/18 0013 06/13/18 0521 06/13/18 0950  TROPONINI 0.10* 0.06* 0.04* 0.03*   No results for input(s): TROPIPOC in the last 168 hours.   BNP Recent Labs  Lab 06/12/18 1903  BNP 189.5*     DDimer No results for input(s): DDIMER in the last 168 hours.   Radiology    No results found.  Cardiac Studies   Echo 05/22/18: Study Conclusions - Left ventricle: The cavity size was normal. Wall thickness was   increased in a pattern of mild LVH. Systolic function was mildly   to moderately reduced. The estimated ejection fraction was in the   range of 40% to 45%. Global hypokinesis with incoordinate septal    motion. The study is not technically sufficient to allow   evaluation of LV diastolic function. - Mitral valve: Mildly thickened leaflets . There was trivial   regurgitation. - Left atrium: The atrium was normal in size. - Tricuspid valve: There was trivial regurgitation. - Pulmonary arteries: PA peak pressure: 25 mm Hg (S). - Inferior vena cava: The vessel was normal in size. The   respirophasic diameter changes were in the normal range (>= 50%),   consistent with normal central venous pressure.  Impressions: - LVEF 40-45%, global hypokinesis, mild LVH, incoordinate septal   motion, trivial MR, normal LA size, trivial Tr, RVSP 25 mmHg,   normal IVC.  Patient Profile     84 y.o. female with a hx of HTN, HLD, DM2, heart failure (unknown baseline) and recent colitis exacerbation who is being followed for new onset atrial fibrillation.   Assessment & Plan    1. New onset atrial fibrillation with RVR - in the setting of colitis, Cdiff, and elevated WBC We will start her on metoprolol 25 mg twice a day.  DC diltiazem drip. Start her on Eliquis mg  twice a day.   2. Hypokalemia  Plans per IM   [facility] will sign off.   Medication Recommendations:  Continue medical therapy  Other recommendations (labs, testing, etc):   Follow up as an outpatient:   Will follow up with me - can see my APP in 4-6  weeks .  Main follow up with be with primary care   For questions or updates, please contact [facility] Please consult [email or web address] for contact info under Cardiology/STEMI.

## 2018-06-15 NOTE — Progress Notes (Addendum)
Benefit check in progress for Chelsea Ward; B Abel Hageman RN,MHA,BSN [phone]   RE: Benefit check  Received: Today  Message Contents  Chelsea Ward, [name] CMA        # 7. S/ W [facility] @ [employer] # [phone]    ELIQUIS 5 MG BID  COVER- YES  CO-PAY- $ 8.50  TIER- NO  PRIOR APPROVAL- NO   PREFERRED PHARMACY :  YES  [facility] AND [facility]

## 2018-06-15 NOTE — Progress Notes (Signed)
[facility]  PROGRESS NOTE  Chelsea Ward QMV:[id number] DOB: Jan 31, 1933 DOA: 06/12/2018 PCP: Deeann Saint, MD   Brief HPI:   84 year old female with history of hypertension, hyperlipidemia, diabetes mellitus, CAD, recent episodes of colitis came from home with complaints of generalized weakness and hypoglycemia with blood glucose 302.  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  Started on vancomycin. Also patient found to have new onset A. fib started on IV heparin, IV Cardizem   Subjective   Patient seen and examined, continues to have diarrhea.  Started on vancomycin p.o.   Assessment/Plan:     1. Atrial fibrillation with RVR-new onset, patient was started on  IV heparin, Cardizem.  She is back in normal sinus rhythm.  Patient had echocardiogram in June 2019, will not repeat echo at this time.  Heparin and Cardizem have been discontinued and started on metoprolol 25 mg twice a day along with Eliquis 2.5 mg twice a day 2. DiarrheaContinues to have diarrhea,-patient has positive C. difficile toxin as well as C. difficile antigen.  Will start vancomycin 125 mg 4 times a day for 2 weeks.  Flexi-Seal in place 3. Diabetes mellitus-blood glucose is well controlled,  discontinued oral hypoglycemic agents.  Started on  sliding scale insulin with NovoLog. 4. Elevated troponin-mild elevation of troponin, trending downwards.  Likely from demand ischemia from A. fib with RVR. 5. Hypokalemia-potassium was 2.7, replete. Today potassium is 4.5.Marland Kitchen     DVT prophylaxis: Heparin  Code Status: Full code  Family Communication: Discussed with patient's son at bedside  Disposition Plan: likely home when medically ready for discharge   Consultants:  None  Procedures:  None   Antibiotics:   Anti-infectives (From admission, onward)   Start     Dose/Rate Route Frequency Ordered Stop   06/13/18 1400  vancomycin (VANCOCIN) 50 mg/mL oral solution 125 mg     125 mg Oral Every 6 hours  06/13/18 1357 06/27/18 1159       Objective   Vitals:   06/15/18 0039 06/15/18 0512 06/15/18 0758 06/15/18 1000  BP:   104/64 100/71  Pulse:   80 78  Resp:   19 18  Temp: 98.3 F (36.8 C) 98.8 F (37.1 C) 99.1 F (37.3 C)   TempSrc: Oral Oral Oral   SpO2:   98% 99%  Weight:  64.6 kg (142 lb 6.7 oz)    Height:        Intake/Output Summary (Last 24 hours) at 06/15/2018 1359 Last data filed at 06/15/2018 0527 Gross per 24 hour  Intake 801.54 ml  Output 1000 ml  Net -198.46 ml   Filed Weights   06/13/18 0637 06/14/18 0450 06/15/18 0512  Weight: 64.8 kg (142 lb 13.7 oz) 65 kg (143 lb 4.8 oz) 64.6 kg (142 lb 6.7 oz)     Physical Examination:     Lungs: Normal respiratory effort, bilateral clear to auscultation, no crackles or wheezes.  Heart: Regular rate and rhythm, S1 and S2 normal, no murmurs, rubs auscultated Abdomen: BS normoactive,soft,nondistended,non-tender to palpation,no organomegaly Extremities: No pretibial edema, no erythema, no cyanosis, no clubbing Neuro : Alert and oriented to time, place and person, No focal deficits       Data Reviewed: I have personally reviewed following labs and imaging studies  CBG: Recent Labs  Lab 06/14/18 1120 06/14/18 1635 06/14/18 2058 06/15/18 0754 06/15/18 1202  GLUCAP 175* 145* 153* 181* 132*    CBC: Recent Labs  Lab 06/09/18 1507 06/12/18 1528 06/13/18 0521 06/14/18  8295 06/15/18 0334  WBC 11.6* 16.9* 16.8* 24.7* 27.7*  HGB 12.2 12.0 11.4* 10.7* 11.2*  HCT 39.8 39.0 36.5 33.2* 35.0*  MCV 91.5 89.7 90.1 88.3 88.2  PLT 214 225 197 210 228    Basic Metabolic Panel: Recent Labs  Lab 06/09/18 1507 06/12/18 1528 06/13/18 0521 06/14/18 0526 06/15/18 0334  NA 138 136 137 133* 134*  K 3.2* 2.7* 3.6 2.7* 4.7  CL 103 98 104 100 100  CO2 22 24 20* 22 21*  GLUCOSE 122* 194* 157* 154* 165*  BUN 9 12 9 13  26*  CREATININE 1.14* 1.24* 0.99 1.01* 1.25*  CALCIUM 9.0 8.7* 8.0* 7.8* 8.6*    Recent  Results (from the past 240 hour(s))  MRSA PCR Screening     Status: None   Collection Time: 06/12/18 11:27 PM  Result Value Ref Range Status   MRSA by PCR NEGATIVE NEGATIVE Final    Comment:        The GeneXpert MRSA Assay (FDA approved for NASAL specimens only), is one component of a comprehensive MRSA colonization surveillance program. It is not intended to diagnose MRSA infection nor to guide or monitor treatment for MRSA infections. Performed at [facility], [address] [place], [place] [address]   Gastrointestinal Panel by PCR , Stool     Status: Abnormal   Collection Time: 06/13/18  3:25 AM  Result Value Ref Range Status   Campylobacter species NOT DETECTED NOT DETECTED Final   Plesimonas shigelloides NOT DETECTED NOT DETECTED Final   Salmonella species NOT DETECTED NOT DETECTED Final   Yersinia enterocolitica NOT DETECTED NOT DETECTED Final   Vibrio species NOT DETECTED NOT DETECTED Final   Vibrio cholerae NOT DETECTED NOT DETECTED Final   Enteroaggregative E coli (EAEC) DETECTED (A) NOT DETECTED Final    Comment: RESULT CALLED TO, READ BACK BY AND VERIFIED WITH: TINA TOREVILLE @1418  06/13/18 AKT    Enteropathogenic E coli (EPEC) DETECTED (A) NOT DETECTED Final    Comment: RESULT CALLED TO, READ BACK BY AND VERIFIED WITH: TINA TOREVILLE @1418  06/13/18 AKT    Enterotoxigenic E coli (ETEC) NOT DETECTED NOT DETECTED Final   Shiga like toxin producing E coli (STEC) NOT DETECTED NOT DETECTED Final   Shigella/Enteroinvasive E coli (EIEC) NOT DETECTED NOT DETECTED Final   Cryptosporidium NOT DETECTED NOT DETECTED Final   Cyclospora cayetanensis NOT DETECTED NOT DETECTED Final   Entamoeba histolytica NOT DETECTED NOT DETECTED Final   Giardia lamblia NOT DETECTED NOT DETECTED Final   Adenovirus F40/41 NOT DETECTED NOT DETECTED Final   Astrovirus NOT DETECTED NOT DETECTED Final   Norovirus GI/GII NOT DETECTED NOT DETECTED Final   Rotavirus A NOT DETECTED NOT DETECTED  Final   Sapovirus (I, II, IV, and V) NOT DETECTED NOT DETECTED Final    Comment: Performed at [facility], [address] [place], [place] [address]  C difficile quick scan w PCR reflex     Status: Abnormal   Collection Time: 06/13/18  3:25 AM  Result Value Ref Range Status   C Diff antigen POSITIVE (A) NEGATIVE Final   C Diff toxin POSITIVE (A) NEGATIVE Final   C Diff interpretation Toxin producing C. difficile detected.  Final    Comment: CRITICAL RESULT CALLED TO, READ BACK BY AND VERIFIED WITH: T.TOURVILLE RN AT [phone] 06/13/18 BY A.DAVIS Performed at [facility], [address] [place], [place] [address]      Liver Function Tests: Recent Labs  Lab 06/09/18  1507 06/13/18 0521  AST 24 17  ALT 36 21  ALKPHOS 42 43  BILITOT 0.9 0.7  PROT 7.9 6.4*  ALBUMIN 3.4* 2.5*   Recent Labs  Lab 06/09/18 1507  LIPASE 30   No results for input(s): AMMONIA in the last 168 hours.  Cardiac Enzymes: Recent Labs  Lab 06/12/18 1903 06/13/18 0013 06/13/18 0521 06/13/18 0950  TROPONINI 0.10* 0.06* 0.04* 0.03*   BNP (last 3 results) Recent Labs    06/12/18 1903  BNP 189.5*    ProBNP (last 3 results) No results for input(s): PROBNP in the last 8760 hours.    Studies: No results found.  Scheduled Meds: . apixaban  2.5 mg Oral BID  . aspirin EC  81 mg Oral Daily  . ezetimibe  10 mg Oral Daily  . furosemide  20 mg Oral Daily  . insulin aspart  0-9 Units Subcutaneous TID WC  . loratadine  10 mg Oral Daily  . metoprolol tartrate  25 mg Oral BID  . nutrition supplement (JUVEN)  1 packet Oral BID BM  . saccharomyces boulardii  250 mg Oral BID  . vancomycin  125 mg Oral Q6H      Time spent: 25 min  Meredeth IdeGagan S Schawn Byas   [facility] Pager [phone]. If 7PM-7AM, please contact night-coverage at [email or web address], Office  [phone]  password [email or web address]  06/15/2018, 1:59 PM  LOS: 3 days

## 2018-06-15 NOTE — Discharge Instructions (Addendum)
1)You are taking a blood thinner called Eliquis/apixaban so Avoid ibuprofen/Advil/Aleve/Motrin/Goody Powders/Naproxen/BC powders/Meloxicam/Diclofenac/Indomethacin and other Nonsteroidal anti-inflammatory medications as these will make you more likely to bleed and can cause stomach ulcers, can also cause Kidney problems.   2)Take all other medications as prescribed  3)Probiotic and Lactobacillus strongly encouraged  4)Repeat CBC and BMP blood test around 06/25/2018    Information on my medicine - ELIQUIS (apixaban)  Why was Eliquis prescribed for you? Eliquis was prescribed for you to reduce the risk of a blood clot forming that can cause a stroke if you have a medical condition called atrial fibrillation (a type of irregular heartbeat).  What do You need to know about Eliquis ? Take your Eliquis TWICE DAILY - one tablet in the morning and one tablet in the evening with or without food. If you have difficulty swallowing the tablet whole please discuss with your pharmacist how to take the medication safely.  Take Eliquis exactly as prescribed by your doctor and DO NOT stop taking Eliquis without talking to the doctor who prescribed the medication.  Stopping may increase your risk of developing a stroke.  Refill your prescription before you run out.  After discharge, you should have regular check-up appointments with your healthcare provider that is prescribing your Eliquis.  In the future your dose may need to be changed if your kidney function or weight changes by a significant amount or as you get older.  What do you do if you miss a dose? If you miss a dose, take it as soon as you remember on the same day and resume taking twice daily.  Do not take more than one dose of ELIQUIS at the same time to make up a missed dose.  Important Safety Information A possible side effect of Eliquis is bleeding. You should call your healthcare provider right away if you experience any of the  following: ? Bleeding from an injury or your nose that does not stop. ? Unusual colored urine (red or dark brown) or unusual colored stools (red or black). ? Unusual bruising for unknown reasons. ? A serious fall or if you hit your head (even if there is no bleeding).  Some medicines may interact with Eliquis and might increase your risk of bleeding or clotting while on Eliquis. To help avoid this, consult your healthcare provider or pharmacist prior to using any new prescription or non-prescription medications, including herbals, vitamins, non-steroidal anti-inflammatory drugs (NSAIDs) and supplements.  This website has more information on Eliquis (apixaban): http://www.eliquis.com/eliquis/home  1)You are taking a blood thinner called Eliquis/apixaban so Avoid ibuprofen/Advil/Aleve/Motrin/Goody Powders/Naproxen/BC powders/Meloxicam/Diclofenac/Indomethacin and other Nonsteroidal anti-inflammatory medications as these will make you more likely to bleed and can cause stomach ulcers, can also cause Kidney problems.   2)Take all other medications as prescribed  3)Probiotic and Lactobacillus strongly encouraged  4)Repeat CBC and BMP blood test around 06/25/2018

## 2018-06-15 NOTE — Progress Notes (Signed)
ANTICOAGULATION CONSULT NOTE - Follow-Up Consult  Pharmacy Consult for heparin Indication: atrial fibrillation  No Known Allergies  Patient Measurements: Height: 5\' 4"  (162.6 cm) Weight: 143 lb 4.8 oz (65 kg) IBW/kg (Calculated) : 54.7 Heparin Dosing Weight: 63.5kg  Vital Signs: Temp: 98.3 F (36.8 C) (07/12 0039) Temp Source: Oral (07/12 0039)  Labs: Recent Labs    06/13/18 0013  06/13/18 0521 06/13/18 0950 06/13/18 1330 06/14/18 0526 06/15/18 0334  HGB  --   --  11.4*  --   --  10.7* 11.2*  HCT  --   --  36.5  --   --  33.2* 35.0*  PLT  --   --  197  --   --  210 228  HEPARINUNFRC  --    < > 0.50  --  0.55 0.31 0.15*  CREATININE  --   --  0.99  --   --  1.01* 1.25*  TROPONINI 0.06*  --  0.04* 0.03*  --   --   --    < > = values in this interval not displayed.    Estimated Creatinine Clearance: 29.4 mL/min (A) (by C-G formula based on SCr of 1.25 mg/dL (H)).  Medications:  Infusions:  . diltiazem (CARDIZEM) infusion 10 mg/hr (06/15/18 0015)  . heparin 900 Units/hr (06/15/18 0014)    Assessment: [age] YOF presented to the ED with hyperglycemia found to be in new onset afib. On IV heparin for anticoagulation. Will f/u switch to PO anticoagulation with eliquis.  Heparin level subtherapeutic this morning 0.15.  No issues with infusion per RN.  No bleeding reported  Goal of Therapy:  Heparin level 0.3-0.7 units/ml Monitor platelets by anticoagulation protocol: Yes   Plan:  - Increase heparin to 1000 units/hr - Follow up heparin level in 8 hours  Thanks for allowing pharmacy to be a part of this patient's care.  Talbert CageLora Asuzena Weis, PharmD Clinical Pharmacist 06/15/2018  5:05 AM

## 2018-06-16 LAB — CBC
HEMATOCRIT: 34.1 % — AB (ref 36.0–46.0)
Hemoglobin: 10.8 g/dL — ABNORMAL LOW (ref 12.0–15.0)
MCH: 27.9 pg (ref 26.0–34.0)
MCHC: 31.7 g/dL (ref 30.0–36.0)
MCV: 88.1 fL (ref 78.0–100.0)
Platelets: 242 10*3/uL (ref 150–400)
RBC: 3.87 MIL/uL (ref 3.87–5.11)
RDW: 15.4 % (ref 11.5–15.5)
WBC: 15.2 10*3/uL — ABNORMAL HIGH (ref 4.0–10.5)

## 2018-06-16 LAB — BASIC METABOLIC PANEL
Anion gap: 11 (ref 5–15)
BUN: 30 mg/dL — ABNORMAL HIGH (ref 8–23)
CALCIUM: 8.7 mg/dL — AB (ref 8.9–10.3)
CO2: 21 mmol/L — AB (ref 22–32)
CREATININE: 1.15 mg/dL — AB (ref 0.44–1.00)
Chloride: 104 mmol/L (ref 98–111)
GFR calc non Af Amer: 43 mL/min — ABNORMAL LOW (ref >60)
GFR, EST AFRICAN AMERICAN: 50 mL/min — AB (ref >60)
Glucose, Bld: 127 mg/dL — ABNORMAL HIGH (ref 70–99)
Potassium: 3.3 mmol/L — ABNORMAL LOW (ref 3.5–5.1)
Sodium: 136 mmol/L (ref 135–145)

## 2018-06-16 LAB — GLUCOSE, CAPILLARY
GLUCOSE-CAPILLARY: 148 mg/dL — AB (ref 70–99)
Glucose-Capillary: 136 mg/dL — ABNORMAL HIGH (ref 70–99)
Glucose-Capillary: 116 mg/dL — ABNORMAL HIGH (ref 70–99)
Glucose-Capillary: 144 mg/dL — ABNORMAL HIGH (ref 70–99)

## 2018-06-16 MED ORDER — ACETAMINOPHEN 325 MG PO TABS
650.0000 mg | ORAL_TABLET | Freq: Three times a day (TID) | ORAL | Status: DC
  Administered 2018-06-16 – 2018-06-21 (×17): 650 mg via ORAL
  Filled 2018-06-16 (×19): qty 2

## 2018-06-16 MED ORDER — ACETAMINOPHEN 325 MG PO TABS: 650.0000 mg | ORAL_TABLET | Freq: Three times a day (TID) | ORAL | Status: DC

## 2018-06-16 MED ORDER — POTASSIUM CHLORIDE CRYS ER 20 MEQ PO TBCR
40.0000 meq | EXTENDED_RELEASE_TABLET | Freq: Once | ORAL | Status: AC
  Administered 2018-06-16: 40 meq via ORAL
  Filled 2018-06-16: qty 2

## 2018-06-16 NOTE — Progress Notes (Addendum)
[facility]  PROGRESS NOTE  Chelsea Ward ZOX:[id number] DOB: [birth year] DOA: 06/12/2018 PCP: Deeann Saint, MD   Brief HPI:   84 year old female with history of hypertension, hyperlipidemia, diabetes mellitus, CAD, recent episodes of colitis came from home with complaints of generalized weakness and hypoglycemia with blood glucose 302.  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  Started on vancomycin. Also patient found to have new onset A. fib started on IV heparin, IV Cardizem   Subjective   Patient seen and examined, complains of generalized pain.   Assessment/Plan:     1. Atrial fibrillation with RVR-new onset, patient was started on  IV heparin, Cardizem.  She is back in normal sinus rhythm.  Patient had echocardiogram in June 2019, will not repeat echo at this time.  Heparin and Cardizem have been discontinued and started on metoprolol 25 mg twice a day along with Eliquis 2.5 mg twice a day 2. Diarrhea-continues to have diarrhea,-patient has positive C. difficile toxin as well as C. difficile antigen.  Will start vancomycin 125 mg 4 times a day for 2 weeks.  Flexi-Seal in place 3. Diabetes mellitus-blood glucose is well controlled,  discontinued oral hypoglycemic agents.  Started on  sliding scale insulin with NovoLog. 4. Elevated troponin-mild elevation of troponin, trending downwards.  Likely from demand ischemia from A. fib with RVR. 5. Generalized pain- will start Tylenol 650 mg q 8 hr. 6. Hypokalemia-potassium was 2.7, replete. Today potassium is 3.3, will replace and check BMP in am.     DVT prophylaxis: Heparin  Code Status: Full code  Family Communication: Discussed with patient's son at bedside  Disposition Plan: likely home when medically ready for discharge   Consultants:  None  Procedures:  None   Antibiotics:   Anti-infectives (From admission, onward)   Start     Dose/Rate Route Frequency Ordered Stop   06/13/18 1400  vancomycin  (VANCOCIN) 50 mg/mL oral solution 125 mg     125 mg Oral Every 6 hours 06/13/18 1357 06/27/18 1159       Objective   Vitals:   06/15/18 2011 06/15/18 2152 06/16/18 0534 06/16/18 1341  BP: 97/70 102/79 107/68 104/62  Pulse: 72 85 67 78  Resp: 16  18 16   Temp: 98.4 F (36.9 C)  98.3 F (36.8 C) 99 F (37.2 C)  TempSrc: Oral  Oral Oral  SpO2: 100%  100% 100%  Weight:   61.6 kg (135 lb 12.8 oz)   Height:        Intake/Output Summary (Last 24 hours) at 06/16/2018 1611 Last data filed at 06/16/2018 1555 Gross per 24 hour  Intake -  Output 1020 ml  Net -1020 ml   Filed Weights   06/14/18 0450 06/15/18 0512 06/16/18 0534  Weight: 65 kg (143 lb 4.8 oz) 64.6 kg (142 lb 6.7 oz) 61.6 kg (135 lb 12.8 oz)     Physical Examination:  Lungs: Normal respiratory effort, bilateral clear to auscultation, no crackles or wheezes.  Heart: Regular rate and rhythm, S1 and S2 normal, no murmurs, rubs auscultated Abdomen: BS normoactive,soft,nondistended,non-tender to palpation,no organomegaly Extremities: No pretibial edema, no erythema, no cyanosis, no clubbing Neuro : Alert and oriented to time, place and person, No focal deficits      Data Reviewed: I have personally reviewed following labs and imaging studies  CBG: Recent Labs  Lab 06/15/18 1202 06/15/18 1538 06/15/18 2104 06/16/18 0740 06/16/18 1121  GLUCAP 132* 180* 149* 136* 148*    CBC: Recent Labs  Lab 06/12/18 1528 06/13/18 0521 06/14/18 0526 06/15/18 0334 06/16/18 0445  WBC 16.9* 16.8* 24.7* 27.7* 15.2*  HGB 12.0 11.4* 10.7* 11.2* 10.8*  HCT 39.0 36.5 33.2* 35.0* 34.1*  MCV 89.7 90.1 88.3 88.2 88.1  PLT 225 197 210 228 242    Basic Metabolic Panel: Recent Labs  Lab 06/12/18 1528 06/13/18 0521 06/14/18 0526 06/15/18 0334 06/16/18 0445  NA 136 137 133* 134* 136  K 2.7* 3.6 2.7* 4.7 3.3*  CL 98 104 100 100 104  CO2 24 20* 22 21* 21*  GLUCOSE 194* 157* 154* 165* 127*  BUN 12 9 13  26* 30*  CREATININE  1.24* 0.99 1.01* 1.25* 1.15*  CALCIUM 8.7* 8.0* 7.8* 8.6* 8.7*    Recent Results (from the past 240 hour(s))  MRSA PCR Screening     Status: None   Collection Time: 06/12/18 11:27 PM  Result Value Ref Range Status   MRSA by PCR NEGATIVE NEGATIVE Final    Comment:        The GeneXpert MRSA Assay (FDA approved for NASAL specimens only), is one component of a comprehensive MRSA colonization surveillance program. It is not intended to diagnose MRSA infection nor to guide or monitor treatment for MRSA infections. Performed at [facility], [address] [place], [place] [address]   Gastrointestinal Panel by PCR , Stool     Status: Abnormal   Collection Time: 06/13/18  3:25 AM  Result Value Ref Range Status   Campylobacter species NOT DETECTED NOT DETECTED Final   Plesimonas shigelloides NOT DETECTED NOT DETECTED Final   Salmonella species NOT DETECTED NOT DETECTED Final   Yersinia enterocolitica NOT DETECTED NOT DETECTED Final   Vibrio species NOT DETECTED NOT DETECTED Final   Vibrio cholerae NOT DETECTED NOT DETECTED Final   Enteroaggregative E coli (EAEC) DETECTED (A) NOT DETECTED Final    Comment: RESULT CALLED TO, READ BACK BY AND VERIFIED WITH: TINA TOREVILLE @1418  06/13/18 AKT    Enteropathogenic E coli (EPEC) DETECTED (A) NOT DETECTED Final    Comment: RESULT CALLED TO, READ BACK BY AND VERIFIED WITH: TINA TOREVILLE @1418  06/13/18 AKT    Enterotoxigenic E coli (ETEC) NOT DETECTED NOT DETECTED Final   Shiga like toxin producing E coli (STEC) NOT DETECTED NOT DETECTED Final   Shigella/Enteroinvasive E coli (EIEC) NOT DETECTED NOT DETECTED Final   Cryptosporidium NOT DETECTED NOT DETECTED Final   Cyclospora cayetanensis NOT DETECTED NOT DETECTED Final   Entamoeba histolytica NOT DETECTED NOT DETECTED Final   Giardia lamblia NOT DETECTED NOT DETECTED Final   Adenovirus F40/41 NOT DETECTED NOT DETECTED Final   Astrovirus NOT DETECTED NOT DETECTED Final   Norovirus  GI/GII NOT DETECTED NOT DETECTED Final   Rotavirus A NOT DETECTED NOT DETECTED Final   Sapovirus (I, II, IV, and V) NOT DETECTED NOT DETECTED Final    Comment: Performed at [facility], [address] [place], [place] [address]  C difficile quick scan w PCR reflex     Status: Abnormal   Collection Time: 06/13/18  3:25 AM  Result Value Ref Range Status   C Diff antigen POSITIVE (A) NEGATIVE Final   C Diff toxin POSITIVE (A) NEGATIVE Final   C Diff interpretation Toxin producing C. difficile detected.  Final    Comment: CRITICAL RESULT CALLED TO, READ BACK BY AND VERIFIED WITH: T.TOURVILLE RN AT [phone] 06/13/18 BY A.DAVIS Performed at [facility], [address], [place], [place] [address]  Liver Function Tests: Recent Labs  Lab 06/13/18 0521  AST 17  ALT 21  ALKPHOS 43  BILITOT 0.7  PROT 6.4*  ALBUMIN 2.5*   No results for input(s): LIPASE, AMYLASE in the last 168 hours. No results for input(s): AMMONIA in the last 168 hours.  Cardiac Enzymes: Recent Labs  Lab 06/12/18 1903 06/13/18 0013 06/13/18 0521 06/13/18 0950  TROPONINI 0.10* 0.06* 0.04* 0.03*   BNP (last 3 results) Recent Labs    06/12/18 1903  BNP 189.5*    ProBNP (last 3 results) No results for input(s): PROBNP in the last 8760 hours.    Studies: No results found.  Scheduled Meds: . acetaminophen  650 mg Oral Q8H  . apixaban  2.5 mg Oral BID  . aspirin EC  81 mg Oral Daily  . ezetimibe  10 mg Oral Daily  . furosemide  20 mg Oral Daily  . insulin aspart  0-9 Units Subcutaneous TID WC  . loratadine  10 mg Oral Daily  . metoprolol tartrate  25 mg Oral BID  . nutrition supplement (JUVEN)  1 packet Oral BID BM  . saccharomyces boulardii  250 mg Oral BID  . vancomycin  125 mg Oral Q6H      Time spent: 25 min  Meredeth IdeGagan S Lama   [facility] Pager [phone]. If 7PM-7AM, please contact night-coverage at [email or web address], Office  [phone]  password  [email or web address]  06/16/2018, 4:11 PM  LOS: 4 days

## 2018-06-16 NOTE — Plan of Care (Signed)
  Problem: Elimination: Goal: Will not experience complications related to bowel motility Outcome: Not Progressing   

## 2018-06-17 LAB — BASIC METABOLIC PANEL
Anion gap: 9 (ref 5–15)
BUN: 32 mg/dL — ABNORMAL HIGH (ref 8–23)
CHLORIDE: 103 mmol/L (ref 98–111)
CO2: 24 mmol/L (ref 22–32)
Calcium: 8.5 mg/dL — ABNORMAL LOW (ref 8.9–10.3)
Creatinine, Ser: 1.1 mg/dL — ABNORMAL HIGH (ref 0.44–1.00)
GFR calc non Af Amer: 45 mL/min — ABNORMAL LOW (ref >60)
GFR, EST AFRICAN AMERICAN: 52 mL/min — AB (ref >60)
GLUCOSE: 118 mg/dL — AB (ref 70–99)
Potassium: 3.5 mmol/L (ref 3.5–5.1)
Sodium: 136 mmol/L (ref 135–145)

## 2018-06-17 LAB — GLUCOSE, CAPILLARY
GLUCOSE-CAPILLARY: 133 mg/dL — AB (ref 70–99)
Glucose-Capillary: 130 mg/dL — ABNORMAL HIGH (ref 70–99)
Glucose-Capillary: 134 mg/dL — ABNORMAL HIGH (ref 70–99)
Glucose-Capillary: 120 mg/dL — ABNORMAL HIGH (ref 70–99)

## 2018-06-17 LAB — CBC
HEMATOCRIT: 34 % — AB (ref 36.0–46.0)
HEMOGLOBIN: 10.8 g/dL — AB (ref 12.0–15.0)
MCH: 28.2 pg (ref 26.0–34.0)
MCHC: 31.8 g/dL (ref 30.0–36.0)
MCV: 88.8 fL (ref 78.0–100.0)
Platelets: 254 10*3/uL (ref 150–400)
RBC: 3.83 MIL/uL — ABNORMAL LOW (ref 3.87–5.11)
RDW: 15.1 % (ref 11.5–15.5)
WBC: 12.2 10*3/uL — ABNORMAL HIGH (ref 4.0–10.5)

## 2018-06-17 NOTE — Progress Notes (Signed)
[facility]  PROGRESS NOTE  Chelsea Ward RUE:[id number] DOB: January 31, 1933 DOA: 06/12/2018 PCP: Deeann SaintBanks, Shannon R, MD   Brief HPI:   84 year old female with history of hypertension, hyperlipidemia, diabetes mellitus, CAD, recent episodes of colitis came from home with complaints of generalized weakness and hypoglycemia with blood glucose 302.  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  Started on vancomycin. Also patient found to have new onset A. fib started on IV heparin, IV Cardizem   Subjective   Patient seen and examined, continues to have loose stool.  Denies pain at this time.   Assessment/Plan:     1. Atrial fibrillation with RVR-new onset, patient was started on  IV heparin, Cardizem.  She is back in normal sinus rhythm.  Patient had echocardiogram in June 2019, will not repeat echo at this time.  Heparin and Cardizem have been discontinued and started on metoprolol 25 mg twice a day along with Eliquis 2.5 mg twice a day 2. Diarrhea-continues to have diarrhea,-patient has positive C. difficile toxin as well as C. difficile antigen.  Will start vancomycin 125 mg 4 times a day for 2 weeks.  Flexi-Seal in place 3. Diabetes mellitus-blood glucose is well controlled,  discontinued oral hypoglycemic agents.  Started on  sliding scale insulin with NovoLog. 4. Elevated troponin-mild elevation of troponin, trending downwards.  Likely from demand ischemia from A. fib with RVR. 5. Generalized pain- will start Tylenol 650 mg q 8 hr. 6. Hypokalemia-potassium was 2.7, replete. Today potassium is 3.3, will replace and check BMP in am.     DVT prophylaxis: Heparin  Code Status: Full code  Family Communication: Discussed with patient's son at bedside  Disposition Plan: likely home when medically ready for discharge   Consultants:  None  Procedures:  None   Antibiotics:   Anti-infectives (From admission, onward)   Start     Dose/Rate Route Frequency Ordered Stop    06/13/18 1400  vancomycin (VANCOCIN) 50 mg/mL oral solution 125 mg     125 mg Oral Every 6 hours 06/13/18 1357 06/27/18 1159       Objective   Vitals:   06/16/18 1341 06/16/18 2032 06/16/18 2345 06/17/18 0614  BP: 104/62 116/81 104/72 91/74  Pulse: 78 96 78 69  Resp: 16 15  15   Temp: 99 F (37.2 C) (!) 97.3 F (36.3 C)  97.8 F (36.6 C)  TempSrc: Oral Oral  Oral  SpO2: 100% 100%  100%  Weight:    61.1 kg (134 lb 9.6 oz)  Height:        Intake/Output Summary (Last 24 hours) at 06/17/2018 1330 Last data filed at 06/17/2018 0900 Gross per 24 hour  Intake 480 ml  Output 1800 ml  Net -1320 ml   Filed Weights   06/15/18 0512 06/16/18 0534 06/17/18 0614  Weight: 64.6 kg (142 lb 6.7 oz) 61.6 kg (135 lb 12.8 oz) 61.1 kg (134 lb 9.6 oz)     Physical Examination:   Neck: Supple, no deformities, masses, or tenderness Lungs: Normal respiratory effort, bilateral clear to auscultation, no crackles or wheezes.  Heart: Regular rate and rhythm, S1 and S2 normal, no murmurs, rubs auscultated Abdomen: BS normoactive,soft,nondistended,non-tender to palpation,no organomegaly Extremities: No pretibial edema, no erythema, no cyanosis, no clubbing Neuro : Alert and oriented to time, place and person, No focal deficits      Data Reviewed: I have personally reviewed following labs and imaging studies  CBG: Recent Labs  Lab 06/16/18 1121 06/16/18 1639 06/16/18 2115 06/17/18  0813 06/17/18 1258  GLUCAP 148* 116* 144* 130* 134*    CBC: Recent Labs  Lab 06/13/18 0521 06/14/18 0526 06/15/18 0334 06/16/18 0445 06/17/18 0541  WBC 16.8* 24.7* 27.7* 15.2* 12.2*  HGB 11.4* 10.7* 11.2* 10.8* 10.8*  HCT 36.5 33.2* 35.0* 34.1* 34.0*  MCV 90.1 88.3 88.2 88.1 88.8  PLT 197 210 228 242 254    Basic Metabolic Panel: Recent Labs  Lab 06/13/18 0521 06/14/18 0526 06/15/18 0334 06/16/18 0445 06/17/18 0541  NA 137 133* 134* 136 136  K 3.6 2.7* 4.7 3.3* 3.5  CL 104 100 100 104 103   CO2 20* 22 21* 21* 24  GLUCOSE 157* 154* 165* 127* 118*  BUN 9 13 26* 30* 32*  CREATININE 0.99 1.01* 1.25* 1.15* 1.10*  CALCIUM 8.0* 7.8* 8.6* 8.7* 8.5*    Recent Results (from the past 240 hour(s))  MRSA PCR Screening     Status: None   Collection Time: 06/12/18 11:27 PM  Result Value Ref Range Status   MRSA by PCR NEGATIVE NEGATIVE Final    Comment:        The GeneXpert MRSA Assay (FDA approved for NASAL specimens only), is one component of a comprehensive MRSA colonization surveillance program. It is not intended to diagnose MRSA infection nor to guide or monitor treatment for MRSA infections. Performed at [facility], [address], [place], [place] [address]   Gastrointestinal Panel by PCR , Stool     Status: Abnormal   Collection Time: 06/13/18  3:25 AM  Result Value Ref Range Status   Campylobacter species NOT DETECTED NOT DETECTED Final   Plesimonas shigelloides NOT DETECTED NOT DETECTED Final   Salmonella species NOT DETECTED NOT DETECTED Final   Yersinia enterocolitica NOT DETECTED NOT DETECTED Final   Vibrio species NOT DETECTED NOT DETECTED Final   Vibrio cholerae NOT DETECTED NOT DETECTED Final   Enteroaggregative E coli (EAEC) DETECTED (A) NOT DETECTED Final    Comment: RESULT CALLED TO, READ BACK BY AND VERIFIED WITH: TINA TOREVILLE @1418  06/13/18 AKT    Enteropathogenic E coli (EPEC) DETECTED (A) NOT DETECTED Final    Comment: RESULT CALLED TO, READ BACK BY AND VERIFIED WITH: TINA TOREVILLE @1418  06/13/18 AKT    Enterotoxigenic E coli (ETEC) NOT DETECTED NOT DETECTED Final   Shiga like toxin producing E coli (STEC) NOT DETECTED NOT DETECTED Final   Shigella/Enteroinvasive E coli (EIEC) NOT DETECTED NOT DETECTED Final   Cryptosporidium NOT DETECTED NOT DETECTED Final   Cyclospora cayetanensis NOT DETECTED NOT DETECTED Final   Entamoeba histolytica NOT DETECTED NOT DETECTED Final   Giardia lamblia NOT DETECTED NOT DETECTED Final   Adenovirus  F40/41 NOT DETECTED NOT DETECTED Final   Astrovirus NOT DETECTED NOT DETECTED Final   Norovirus GI/GII NOT DETECTED NOT DETECTED Final   Rotavirus A NOT DETECTED NOT DETECTED Final   Sapovirus (I, II, IV, and V) NOT DETECTED NOT DETECTED Final    Comment: Performed at [facility], [address] [place], [place] [address]  C difficile quick scan w PCR reflex     Status: Abnormal   Collection Time: 06/13/18  3:25 AM  Result Value Ref Range Status   C Diff antigen POSITIVE (A) NEGATIVE Final   C Diff toxin POSITIVE (A) NEGATIVE Final   C Diff interpretation Toxin producing C. difficile detected.  Final    Comment: CRITICAL RESULT CALLED TO, READ BACK BY AND VERIFIED WITH: T.TOURVILLE RN AT [phone] 06/13/18 BY A.DAVIS Performed  at [facility], [address] [place], [place] [address]      Liver Function Tests: Recent Labs  Lab 06/13/18 0521  AST 17  ALT 21  ALKPHOS 43  BILITOT 0.7  PROT 6.4*  ALBUMIN 2.5*   No results for input(s): LIPASE, AMYLASE in the last 168 hours. No results for input(s): AMMONIA in the last 168 hours.  Cardiac Enzymes: Recent Labs  Lab 06/12/18 1903 06/13/18 0013 06/13/18 0521 06/13/18 0950  TROPONINI 0.10* 0.06* 0.04* 0.03*   BNP (last 3 results) Recent Labs    06/12/18 1903  BNP 189.5*    ProBNP (last 3 results) No results for input(s): PROBNP in the last 8760 hours.    Studies: No results found.  Scheduled Meds: . acetaminophen  650 mg Oral Q8H  . apixaban  2.5 mg Oral BID  . aspirin EC  81 mg Oral Daily  . ezetimibe  10 mg Oral Daily  . furosemide  20 mg Oral Daily  . insulin aspart  0-9 Units Subcutaneous TID WC  . loratadine  10 mg Oral Daily  . metoprolol tartrate  25 mg Oral BID  . nutrition supplement (JUVEN)  1 packet Oral BID BM  . saccharomyces boulardii  250 mg Oral BID  . vancomycin  125 mg Oral Q6H      Time spent: 25 min  Meredeth Ide   [facility] Pager [phone]. If 7PM-7AM,  please contact night-coverage at [email or web address], Office  [phone]  password [email or web address]  06/17/2018, 1:30 PM  LOS: 5 days

## 2018-06-18 LAB — BASIC METABOLIC PANEL
ANION GAP: 8 (ref 5–15)
BUN: 31 mg/dL — AB (ref 8–23)
CHLORIDE: 105 mmol/L (ref 98–111)
CO2: 26 mmol/L (ref 22–32)
Calcium: 8.6 mg/dL — ABNORMAL LOW (ref 8.9–10.3)
Creatinine, Ser: 0.98 mg/dL (ref 0.44–1.00)
GFR, EST NON AFRICAN AMERICAN: 52 mL/min — AB (ref >60)
Glucose, Bld: 144 mg/dL — ABNORMAL HIGH (ref 70–99)
POTASSIUM: 3.6 mmol/L (ref 3.5–5.1)
Sodium: 139 mmol/L (ref 135–145)

## 2018-06-18 LAB — CBC
HCT: 34.6 % — ABNORMAL LOW (ref 36.0–46.0)
HEMOGLOBIN: 10.9 g/dL — AB (ref 12.0–15.0)
MCH: 27.9 pg (ref 26.0–34.0)
MCHC: 31.5 g/dL (ref 30.0–36.0)
MCV: 88.7 fL (ref 78.0–100.0)
PLATELETS: 284 10*3/uL (ref 150–400)
RBC: 3.9 MIL/uL (ref 3.87–5.11)
RDW: 15.1 % (ref 11.5–15.5)
WBC: 10.2 10*3/uL (ref 4.0–10.5)

## 2018-06-18 LAB — GLUCOSE, CAPILLARY
GLUCOSE-CAPILLARY: 130 mg/dL — AB (ref 70–99)
GLUCOSE-CAPILLARY: 145 mg/dL — AB (ref 70–99)
Glucose-Capillary: 112 mg/dL — ABNORMAL HIGH (ref 70–99)
Glucose-Capillary: 177 mg/dL — ABNORMAL HIGH (ref 70–99)

## 2018-06-18 NOTE — Progress Notes (Signed)
[facility]  PROGRESS NOTE  Lurene Robley OZH:[id number] DOB: 1933-03-22 DOA: 06/12/2018 PCP: Deeann Saint, MD   Brief HPI:   84 year old female with history of hypertension, hyperlipidemia, diabetes mellitus, CAD, recent episodes of colitis came from home with complaints of generalized weakness and hypoglycemia with blood glucose 302.  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  Started on vancomycin. Also patient found to have new onset A. fib started on IV heparin, IV Cardizem   Subjective   Diarrhea is finally slowing down, patient denies pain.  She has been getting scheduled Tylenol.  Heart rate is controlled.   Assessment/Plan:     1. Atrial fibrillation with RVR-new onset, patient was started on  IV heparin, Cardizem.  She is back in normal sinus rhythm.  Patient had echocardiogram in June 2019, will not repeat echo at this time.  Heparin and Cardizem have been discontinued and started on metoprolol 25 mg twice a day along with Eliquis 2.5 mg twice a day.  Heart rate is controlled she is back into normal sinus rhythm 2. Diarrhea-improved,patient has positive C. difficile toxin as well as C. difficile antigen.  Continue repeat vancomycin 125 mg 4 times a day for 2 weeks.  Flexi-Seal in place 3. Diabetes mellitus-blood glucose is well controlled,  discontinued oral hypoglycemic agents.  Started on  sliding scale insulin with NovoLog. 4. Elevated troponin-mild elevation of troponin, trending downwards.  Likely from demand ischemia from A. fib with RVR. 5. Generalized pain- will start Tylenol 650 mg q 8 hr. 6. Hypokalemia-potassium was 2.7, replete. Today potassium is 3.6.  Check BMP in a.m.     DVT prophylaxis: Heparin  Code Status: Full code  Family Communication: Discussed with patient's son at bedside  Disposition Plan: likely skilled nursing facility at the time of discharge   Consultants:  None  Procedures:  None   Antibiotics:   Anti-infectives  (From admission, onward)   Start     Dose/Rate Route Frequency Ordered Stop   06/13/18 1400  vancomycin (VANCOCIN) 50 mg/mL oral solution 125 mg     125 mg Oral Every 6 hours 06/13/18 1357 06/27/18 1159       Objective   Vitals:   06/17/18 2200 06/18/18 0500 06/18/18 0512 06/18/18 1005  BP:   126/81 119/82  Pulse: (!) 59 65 68 73  Resp: 17 13    Temp:   98.8 F (37.1 C)   TempSrc:   Oral   SpO2: 100% 100% 98%   Weight:   61.9 kg (136 lb 7.4 oz)   Height:        Intake/Output Summary (Last 24 hours) at 06/18/2018 1404 Last data filed at 06/18/2018 1200 Gross per 24 hour  Intake -  Output 950 ml  Net -950 ml   Filed Weights   06/16/18 0534 06/17/18 0614 06/18/18 0512  Weight: 61.6 kg (135 lb 12.8 oz) 61.1 kg (134 lb 9.6 oz) 61.9 kg (136 lb 7.4 oz)     Physical Examination:   Lungs: Normal respiratory effort, bilateral clear to auscultation, no crackles or wheezes.  Heart: Regular rate and rhythm, S1 and S2 normal, no murmurs, rubs auscultated Abdomen: BS normoactive,soft,nondistended,non-tender to palpation,no organomegaly Extremities: No pretibial edema, no erythema, no cyanosis, no clubbing Neuro : Alert and oriented to time, place and person, No focal deficits Skin: No rashes seen on exam     Data Reviewed: I have personally reviewed following labs and imaging studies  CBG: Recent Labs  Lab 06/17/18 1258  06/17/18 1627 06/17/18 2110 06/18/18 0757 06/18/18 1202  GLUCAP 134* 120* 133* 130* 145*    CBC: Recent Labs  Lab 06/14/18 0526 06/15/18 0334 06/16/18 0445 06/17/18 0541 06/18/18 0623  WBC 24.7* 27.7* 15.2* 12.2* 10.2  HGB 10.7* 11.2* 10.8* 10.8* 10.9*  HCT 33.2* 35.0* 34.1* 34.0* 34.6*  MCV 88.3 88.2 88.1 88.8 88.7  PLT 210 228 242 254 284    Basic Metabolic Panel: Recent Labs  Lab 06/14/18 0526 06/15/18 0334 06/16/18 0445 06/17/18 0541 06/18/18 0623  NA 133* 134* 136 136 139  K 2.7* 4.7 3.3* 3.5 3.6  CL 100 100 104 103 105  CO2  22 21* 21* 24 26  GLUCOSE 154* 165* 127* 118* 144*  BUN 13 26* 30* 32* 31*  CREATININE 1.01* 1.25* 1.15* 1.10* 0.98  CALCIUM 7.8* 8.6* 8.7* 8.5* 8.6*    Recent Results (from the past 240 hour(s))  MRSA PCR Screening     Status: None   Collection Time: 06/12/18 11:27 PM  Result Value Ref Range Status   MRSA by PCR NEGATIVE NEGATIVE Final    Comment:        The GeneXpert MRSA Assay (FDA approved for NASAL specimens only), is one component of a comprehensive MRSA colonization surveillance program. It is not intended to diagnose MRSA infection nor to guide or monitor treatment for MRSA infections. Performed at [facility], [address] [place], [place] [address]   Gastrointestinal Panel by PCR , Stool     Status: Abnormal   Collection Time: 06/13/18  3:25 AM  Result Value Ref Range Status   Campylobacter species NOT DETECTED NOT DETECTED Final   Plesimonas shigelloides NOT DETECTED NOT DETECTED Final   Salmonella species NOT DETECTED NOT DETECTED Final   Yersinia enterocolitica NOT DETECTED NOT DETECTED Final   Vibrio species NOT DETECTED NOT DETECTED Final   Vibrio cholerae NOT DETECTED NOT DETECTED Final   Enteroaggregative E coli (EAEC) DETECTED (A) NOT DETECTED Final    Comment: RESULT CALLED TO, READ BACK BY AND VERIFIED WITH: TINA TOREVILLE @1418  06/13/18 AKT    Enteropathogenic E coli (EPEC) DETECTED (A) NOT DETECTED Final    Comment: RESULT CALLED TO, READ BACK BY AND VERIFIED WITH: TINA TOREVILLE @1418  06/13/18 AKT    Enterotoxigenic E coli (ETEC) NOT DETECTED NOT DETECTED Final   Shiga like toxin producing E coli (STEC) NOT DETECTED NOT DETECTED Final   Shigella/Enteroinvasive E coli (EIEC) NOT DETECTED NOT DETECTED Final   Cryptosporidium NOT DETECTED NOT DETECTED Final   Cyclospora cayetanensis NOT DETECTED NOT DETECTED Final   Entamoeba histolytica NOT DETECTED NOT DETECTED Final   Giardia lamblia NOT DETECTED NOT DETECTED Final   Adenovirus F40/41 NOT  DETECTED NOT DETECTED Final   Astrovirus NOT DETECTED NOT DETECTED Final   Norovirus GI/GII NOT DETECTED NOT DETECTED Final   Rotavirus A NOT DETECTED NOT DETECTED Final   Sapovirus (I, II, IV, and V) NOT DETECTED NOT DETECTED Final    Comment: Performed at [facility], [address] [place], [place] [address]  C difficile quick scan w PCR reflex     Status: Abnormal   Collection Time: 06/13/18  3:25 AM  Result Value Ref Range Status   C Diff antigen POSITIVE (A) NEGATIVE Final   C Diff toxin POSITIVE (A) NEGATIVE Final   C Diff interpretation Toxin producing C. difficile detected.  Final    Comment: CRITICAL RESULT CALLED TO, READ BACK BY AND VERIFIED WITH: T.TOURVILLE RN AT  [phone] 06/13/18 BY A.DAVIS Performed at [facility], [address] [place], [place] [address]      Liver Function Tests: Recent Labs  Lab 06/13/18 0521  AST 17  ALT 21  ALKPHOS 43  BILITOT 0.7  PROT 6.4*  ALBUMIN 2.5*   No results for input(s): LIPASE, AMYLASE in the last 168 hours. No results for input(s): AMMONIA in the last 168 hours.  Cardiac Enzymes: Recent Labs  Lab 06/12/18 1903 06/13/18 0013 06/13/18 0521 06/13/18 0950  TROPONINI 0.10* 0.06* 0.04* 0.03*   BNP (last 3 results) Recent Labs    06/12/18 1903  BNP 189.5*    ProBNP (last 3 results) No results for input(s): PROBNP in the last 8760 hours.    Studies: No results found.  Scheduled Meds: . acetaminophen  650 mg Oral Q8H  . apixaban  2.5 mg Oral BID  . aspirin EC  81 mg Oral Daily  . ezetimibe  10 mg Oral Daily  . furosemide  20 mg Oral Daily  . insulin aspart  0-9 Units Subcutaneous TID WC  . loratadine  10 mg Oral Daily  . metoprolol tartrate  25 mg Oral BID  . nutrition supplement (JUVEN)  1 packet Oral BID BM  . saccharomyces boulardii  250 mg Oral BID  . vancomycin  125 mg Oral Q6H      Time spent: 25 min  Meredeth IdeGagan S Cari Burgo   [facility] Pager [phone]. If 7PM-7AM, please  contact night-coverage at [email or web address], Office  [phone]  password [email or web address]  06/18/2018, 2:04 PM  LOS: 6 days

## 2018-06-18 NOTE — Plan of Care (Signed)
  Problem: Elimination: Goal: Will not experience complications related to urinary retention Outcome: Progressing Note:  No s/s of urinary retention noted.   

## 2018-06-19 LAB — BASIC METABOLIC PANEL
Anion gap: 9 (ref 5–15)
BUN: 28 mg/dL — AB (ref 8–23)
CALCIUM: 8.8 mg/dL — AB (ref 8.9–10.3)
CO2: 25 mmol/L (ref 22–32)
CREATININE: 0.87 mg/dL (ref 0.44–1.00)
Chloride: 106 mmol/L (ref 98–111)
GFR calc Af Amer: >60 mL/min (ref >60)
GFR, EST NON AFRICAN AMERICAN: 60 mL/min — AB (ref >60)
GLUCOSE: 147 mg/dL — AB (ref 70–99)
POTASSIUM: 3.7 mmol/L (ref 3.5–5.1)
SODIUM: 140 mmol/L (ref 135–145)

## 2018-06-19 LAB — GLUCOSE, CAPILLARY
GLUCOSE-CAPILLARY: 146 mg/dL — AB (ref 70–99)
Glucose-Capillary: 140 mg/dL — ABNORMAL HIGH (ref 70–99)
Glucose-Capillary: 146 mg/dL — ABNORMAL HIGH (ref 70–99)
Glucose-Capillary: 150 mg/dL — ABNORMAL HIGH (ref 70–99)

## 2018-06-19 LAB — CBC
HCT: 34.7 % — ABNORMAL LOW (ref 36.0–46.0)
Hemoglobin: 10.9 g/dL — ABNORMAL LOW (ref 12.0–15.0)
MCH: 28 pg (ref 26.0–34.0)
MCHC: 31.4 g/dL (ref 30.0–36.0)
MCV: 89.2 fL (ref 78.0–100.0)
PLATELETS: 297 10*3/uL (ref 150–400)
RBC: 3.89 MIL/uL (ref 3.87–5.11)
RDW: 15.3 % (ref 11.5–15.5)
WBC: 8 10*3/uL (ref 4.0–10.5)

## 2018-06-19 NOTE — Plan of Care (Signed)
  Problem: Spiritual Needs Goal: Ability to function at adequate level Outcome: Progressing   

## 2018-06-19 NOTE — Progress Notes (Addendum)
[facility]  PROGRESS NOTE  Chelsea Ward NWG:[id number] DOB: Jun 10, 1933 DOA: 06/12/2018 PCP: Deeann Saint, MD   Brief HPI:   84 year old female with history of hypertension, hyperlipidemia, diabetes mellitus, CAD, recent episodes of colitis came from home with complaints of generalized weakness and hypoglycemia with blood glucose 302.  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  Started on vancomycin. Also patient found to have new onset A. fib started on IV heparin, IV Cardizem   Subjective   Patient seen and examined, denies pain.  Diarrhea slowly improving.   Assessment/Plan:     1. Atrial fibrillation with RVR-new onset, patient was started on  IV heparin, Cardizem.  She is back in normal sinus rhythm.  Patient had echocardiogram in June 2019, will not repeat echo at this time.  Heparin and Cardizem have been discontinued and started on metoprolol 25 mg twice a day along with Eliquis 2.5 mg twice a day.  Heart rate is controlled she is back into normal sinus rhythm 2. Diarrhea-improving,patient has positive C. difficile toxin as well as C. difficile antigen.  Continue vancomycin 125 mg 4 times a day for 2 weeks.  Flexi-Seal in place 3. Diabetes mellitus-blood glucose is well controlled,  discontinued oral hypoglycemic agents.  Started on  sliding scale insulin with NovoLog. 4. Elevated troponin-mild elevation of troponin, trending downwards.  Likely from demand ischemia from A. fib with RVR. 5. Generalized pain- will start Tylenol 650 mg q 8 hr. 6. Hypokalemia-potassium was 2.7, replete. Today potassium is 3.6.  Check BMP in a.m.     DVT prophylaxis: Heparin  Code Status: Full code  Family Communication: Discussed with patient's son at bedside  Disposition Plan: likely skilled nursing facility at the time of discharge   Consultants:  None  Procedures:  None   Antibiotics:   Anti-infectives (From admission, onward)   Start     Dose/Rate Route Frequency  Ordered Stop   06/13/18 1400  vancomycin (VANCOCIN) 50 mg/mL oral solution 125 mg     125 mg Oral Every 6 hours 06/13/18 1357 06/27/18 1159       Objective   Vitals:   06/18/18 2200 06/19/18 0351 06/19/18 0832 06/19/18 1157  BP:  136/78 124/67 (!) 122/96  Pulse: (!) 57 65    Resp: 14     Temp:  99.3 F (37.4 C) 99.2 F (37.3 C) 97.9 F (36.6 C)  TempSrc:  Oral Oral Oral  SpO2: 99% 99% 98%   Weight:  61 kg (134 lb 8 oz)    Height:        Intake/Output Summary (Last 24 hours) at 06/19/2018 1300 Last data filed at 06/19/2018 1200 Gross per 24 hour  Intake 480 ml  Output 550 ml  Net -70 ml   Filed Weights   06/17/18 0614 06/18/18 0512 06/19/18 0351  Weight: 61.1 kg (134 lb 9.6 oz) 61.9 kg (136 lb 7.4 oz) 61 kg (134 lb 8 oz)     Physical Examination:  ,  Neck: Supple, no deformities, masses, or tenderness Lungs: Normal respiratory effort, bilateral clear to auscultation, no crackles or wheezes.  Heart: Regular rate and rhythm, S1 and S2 normal, no murmurs, rubs auscultated Abdomen: BS normoactive,soft,nondistended,non-tender to palpation,no organomegaly Extremities: No pretibial edema, no erythema, no cyanosis, no clubbing Neuro : Alert and oriented to time, place and person, No focal deficits      Data Reviewed: I have personally reviewed following labs and imaging studies  CBG: Recent Labs  Lab 06/18/18 1202  06/18/18 1648 06/18/18 2115 06/19/18 0746 06/19/18 1207  GLUCAP 145* 112* 177* 140* 146*    CBC: Recent Labs  Lab 06/15/18 0334 06/16/18 0445 06/17/18 0541 06/18/18 0623 06/19/18 0627  WBC 27.7* 15.2* 12.2* 10.2 8.0  HGB 11.2* 10.8* 10.8* 10.9* 10.9*  HCT 35.0* 34.1* 34.0* 34.6* 34.7*  MCV 88.2 88.1 88.8 88.7 89.2  PLT 228 242 254 284 297    Basic Metabolic Panel: Recent Labs  Lab 06/15/18 0334 06/16/18 0445 06/17/18 0541 06/18/18 0623 06/19/18 0627  NA 134* 136 136 139 140  K 4.7 3.3* 3.5 3.6 3.7  CL 100 104 103 105 106  CO2  21* 21* 24 26 25   GLUCOSE 165* 127* 118* 144* 147*  BUN 26* 30* 32* 31* 28*  CREATININE 1.25* 1.15* 1.10* 0.98 0.87  CALCIUM 8.6* 8.7* 8.5* 8.6* 8.8*    Recent Results (from the past 240 hour(s))  MRSA PCR Screening     Status: None   Collection Time: 06/12/18 11:27 PM  Result Value Ref Range Status   MRSA by PCR NEGATIVE NEGATIVE Final    Comment:        The GeneXpert MRSA Assay (FDA approved for NASAL specimens only), is one component of a comprehensive MRSA colonization surveillance program. It is not intended to diagnose MRSA infection nor to guide or monitor treatment for MRSA infections. Performed at [facility], [address], [place], [place] [address]   Gastrointestinal Panel by PCR , Stool     Status: Abnormal   Collection Time: 06/13/18  3:25 AM  Result Value Ref Range Status   Campylobacter species NOT DETECTED NOT DETECTED Final   Plesimonas shigelloides NOT DETECTED NOT DETECTED Final   Salmonella species NOT DETECTED NOT DETECTED Final   Yersinia enterocolitica NOT DETECTED NOT DETECTED Final   Vibrio species NOT DETECTED NOT DETECTED Final   Vibrio cholerae NOT DETECTED NOT DETECTED Final   Enteroaggregative E coli (EAEC) DETECTED (A) NOT DETECTED Final    Comment: RESULT CALLED TO, READ BACK BY AND VERIFIED WITH: TINA TOREVILLE @1418  06/13/18 AKT    Enteropathogenic E coli (EPEC) DETECTED (A) NOT DETECTED Final    Comment: RESULT CALLED TO, READ BACK BY AND VERIFIED WITH: TINA TOREVILLE @1418  06/13/18 AKT    Enterotoxigenic E coli (ETEC) NOT DETECTED NOT DETECTED Final   Shiga like toxin producing E coli (STEC) NOT DETECTED NOT DETECTED Final   Shigella/Enteroinvasive E coli (EIEC) NOT DETECTED NOT DETECTED Final   Cryptosporidium NOT DETECTED NOT DETECTED Final   Cyclospora cayetanensis NOT DETECTED NOT DETECTED Final   Entamoeba histolytica NOT DETECTED NOT DETECTED Final   Giardia lamblia NOT DETECTED NOT DETECTED Final   Adenovirus F40/41 NOT  DETECTED NOT DETECTED Final   Astrovirus NOT DETECTED NOT DETECTED Final   Norovirus GI/GII NOT DETECTED NOT DETECTED Final   Rotavirus A NOT DETECTED NOT DETECTED Final   Sapovirus (I, II, IV, and V) NOT DETECTED NOT DETECTED Final    Comment: Performed at [facility], [address] [place], [place] [address]  C difficile quick scan w PCR reflex     Status: Abnormal   Collection Time: 06/13/18  3:25 AM  Result Value Ref Range Status   C Diff antigen POSITIVE (A) NEGATIVE Final   C Diff toxin POSITIVE (A) NEGATIVE Final   C Diff interpretation Toxin producing C. difficile detected.  Final    Comment: CRITICAL RESULT CALLED TO, READ BACK BY AND VERIFIED WITH: T.TOURVILLE RN AT  [id number] 06/13/18 BY A.DAVIS Performed at [facility], [address] [place], [place] [address]      Liver Function Tests: Recent Labs  Lab 06/13/18 0521  AST 17  ALT 21  ALKPHOS 43  BILITOT 0.7  PROT 6.4*  ALBUMIN 2.5*   No results for input(s): LIPASE, AMYLASE in the last 168 hours. No results for input(s): AMMONIA in the last 168 hours.  Cardiac Enzymes: Recent Labs  Lab 06/12/18 1903 06/13/18 0013 06/13/18 0521 06/13/18 0950  TROPONINI 0.10* 0.06* 0.04* 0.03*   BNP (last 3 results) Recent Labs    06/12/18 1903  BNP 189.5*    ProBNP (last 3 results) No results for input(s): PROBNP in the last 8760 hours.    Studies: No results found.  Scheduled Meds: . acetaminophen  650 mg Oral Q8H  . apixaban  2.5 mg Oral BID  . aspirin EC  81 mg Oral Daily  . ezetimibe  10 mg Oral Daily  . furosemide  20 mg Oral Daily  . insulin aspart  0-9 Units Subcutaneous TID WC  . loratadine  10 mg Oral Daily  . metoprolol tartrate  25 mg Oral BID  . nutrition supplement (JUVEN)  1 packet Oral BID BM  . saccharomyces boulardii  250 mg Oral BID  . vancomycin  125 mg Oral Q6H      Time spent: 25 min  Meredeth IdeGagan S Zackari Ruane   [facility] Pager [phone]. If 7PM-7AM, please  contact night-coverage at [email or web address], Office  [phone]  password [email or web address]  06/19/2018, 1:00 PM  LOS: 7 days

## 2018-06-19 NOTE — Plan of Care (Signed)
  Problem: Elimination: Goal: Will not experience complications related to bowel motility Outcome: Progressing Note:  Rectal tube still in place; output decreasing. Goal: Will not experience complications related to urinary retention Outcome: Progressing Note:  No s/s of urinary retention noted.  Voids without difficulty.

## 2018-06-19 NOTE — Care Management Note (Signed)
Case Management Note  Patient Details  Name: Chelsea RabonOdessa Faulkenberry MRN: [id number] Date of Birth: 1933/05/18  Subjective/Objective:  Pt presented for A Fib new onset - IV Cardizem gtt changed to po. Positive for c diff on po vancomycin. Pt with Flexiseal- needs PT/ OT consult for disposition needs.             Action/Plan: CM will continue to monitor for additional home needs. Pt was previously active with [facility] for Services.   Expected Discharge Date:                  Expected Discharge Plan:  Home w Home Health Services  In-House Referral:  Clinical Social Work  Discharge planning Services  CM Consult  Post Acute Care Choice:    Choice offered to:     DME Arranged:    DME Agency:     HH Arranged:    HH Agency:     Status of Service:  In process, will continue to follow  If discussed at Long Length of Stay Meetings, dates discussed:    Additional Comments:  Gala LewandowskyGraves-Bigelow, Raniah Karan Kaye, RN 06/19/2018, 4:05 PM

## 2018-06-20 LAB — GLUCOSE, CAPILLARY
Glucose-Capillary: 136 mg/dL — ABNORMAL HIGH (ref 70–99)
Glucose-Capillary: 144 mg/dL — ABNORMAL HIGH (ref 70–99)
Glucose-Capillary: 129 mg/dL — ABNORMAL HIGH (ref 70–99)

## 2018-06-20 LAB — CBC
HEMATOCRIT: 33.6 % — AB (ref 36.0–46.0)
HEMOGLOBIN: 10.8 g/dL — AB (ref 12.0–15.0)
MCH: 29.1 pg (ref 26.0–34.0)
MCHC: 32.1 g/dL (ref 30.0–36.0)
MCV: 90.6 fL (ref 78.0–100.0)
Platelets: 300 10*3/uL (ref 150–400)
RBC: 3.71 MIL/uL — ABNORMAL LOW (ref 3.87–5.11)
RDW: 15.2 % (ref 11.5–15.5)
WBC: 8.1 10*3/uL (ref 4.0–10.5)

## 2018-06-20 MED ORDER — GLUCERNA SHAKE PO LIQD
237.0000 mL | Freq: Two times a day (BID) | ORAL | Status: DC
  Administered 2018-06-21: 237 mL via ORAL

## 2018-06-20 NOTE — Progress Notes (Signed)
[facility]  PROGRESS NOTE  Chelsea RabonOdessa Ward UJW:[id number] DOB: Jul 17, 1933 DOA: 06/12/2018 PCP: Deeann SaintBanks, Chelsea R, MD   Brief summary 84 year old female with history of hypertension, hyperlipidemia, diabetes mellitus, CAD, recent episodes of colitis came from home with complaints of generalized weakness and hypoglycemia with blood glucose 302.  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  Started on oral vancomycin.  Patient found to be in A. fib during this admission    Subjective   Volume and frequency of stools have improved the patient has a rectal tube   Assessment/Plan:     1. Atrial fibrillation with RVR-new onset, it appears to be back in sinus rhythm at this time, continue Eliquis 2.5 mg twice daily, continue metoprolol 25 mg twice daily   2. C. difficile diarrhea-improving,patient has positive C. difficile toxin as well as C. difficile antigen.  Continue vancomycin 125 mg 4 times a day for 2 weeks.  Rectal tube in situ   3)diabetes mellitus-last A1c 7.1, Use Novolog/Humalog Sliding scale insulin with Accu-Cheks/Fingersticks as ordered  4) dyslipidemia--c/n zetia and aspirin    DVT prophylaxis:  eliquis Code Status: Full code  Family Communication: Discussed with patient  Disposition Plan: likely skilled nursing facility at the time of discharge   Antibiotics:   Anti-infectives (From admission, onward)   Start     Dose/Rate Route Frequency Ordered Stop   06/13/18 1400  vancomycin (VANCOCIN) 50 mg/mL oral solution 125 mg     125 mg Oral Every 6 hours 06/13/18 1357 06/27/18 1159       Objective   Vitals:   06/19/18 1529 06/19/18 1925 06/20/18 0337 06/20/18 1456  BP:  118/70 134/75 120/67  Pulse:  93 68 66  Resp:  14 14 19   Temp: 98.7 F (37.1 C) 98.8 F (37.1 C) 98 F (36.7 C) 98.4 F (36.9 C)  TempSrc: Oral Oral Oral Oral  SpO2:  100% 98% 100%  Weight:   61.6 kg (135 lb 11.2 oz)   Height:        Intake/Output Summary (Last 24 hours) at  06/20/2018 1703 Last data filed at 06/20/2018 1627 Gross per 24 hour  Intake 1080 ml  Output 600 ml  Net 480 ml   Filed Weights   06/18/18 0512 06/19/18 0351 06/20/18 0337  Weight: 61.9 kg (136 lb 7.4 oz) 61 kg (134 lb 8 oz) 61.6 kg (135 lb 11.2 oz)     Physical Examination:  ,  Neck: Supple, no deformities, masses, or tenderness Lungs: Normal respiratory effort, bilateral clear to auscultation, no crackles or wheezes.  Heart: Regular rate and rhythm, S1 and S2 normal, no murmurs, rubs auscultated Abdomen: BS normoactive,soft,nondistended,non-tender to palpation,no organomegaly Extremities: No pretibial edema, no erythema, no cyanosis, no clubbing Neuro : Alert and oriented to time, place and person, No focal deficits Rectal-rectal tube in situ with fecal material    CBG: Recent Labs  Lab 06/19/18 1207 06/19/18 1627 06/19/18 2113 06/20/18 0808 06/20/18 1627  GLUCAP 146* 150* 146* 136* 144*    CBC: Recent Labs  Lab 06/16/18 0445 06/17/18 0541 06/18/18 0623 06/19/18 0627 06/20/18 0359  WBC 15.2* 12.2* 10.2 8.0 8.1  HGB 10.8* 10.8* 10.9* 10.9* 10.8*  HCT 34.1* 34.0* 34.6* 34.7* 33.6*  MCV 88.1 88.8 88.7 89.2 90.6  PLT 242 254 284 297 300    Basic Metabolic Panel: Recent Labs  Lab 06/15/18 0334 06/16/18 0445 06/17/18 0541 06/18/18 0623 06/19/18 0627  NA 134* 136 136 139 140  K 4.7 3.3* 3.5 3.6  3.7  CL 100 104 103 105 106  CO2 21* 21* 24 26 25   GLUCOSE 165* 127* 118* 144* 147*  BUN 26* 30* 32* 31* 28*  CREATININE 1.25* 1.15* 1.10* 0.98 0.87  CALCIUM 8.6* 8.7* 8.5* 8.6* 8.8*    Recent Results (from the past 240 hour(s))  MRSA PCR Screening     Status: None   Collection Time: 06/12/18 11:27 PM  Result Value Ref Range Status   MRSA by PCR NEGATIVE NEGATIVE Final    Comment:        The GeneXpert MRSA Assay (FDA approved for NASAL specimens only), is one component of a comprehensive MRSA colonization surveillance program. It is not intended to  diagnose MRSA infection nor to guide or monitor treatment for MRSA infections. Performed at [facility], [address] [place], [place] [address]   Gastrointestinal Panel by PCR , Stool     Status: Abnormal   Collection Time: 06/13/18  3:25 AM  Result Value Ref Range Status   Campylobacter species NOT DETECTED NOT DETECTED Final   Plesimonas shigelloides NOT DETECTED NOT DETECTED Final   Salmonella species NOT DETECTED NOT DETECTED Final   Yersinia enterocolitica NOT DETECTED NOT DETECTED Final   Vibrio species NOT DETECTED NOT DETECTED Final   Vibrio cholerae NOT DETECTED NOT DETECTED Final   Enteroaggregative E coli (EAEC) DETECTED (A) NOT DETECTED Final    Comment: RESULT CALLED TO, READ BACK BY AND VERIFIED WITH: TINA TOREVILLE @1418  06/13/18 AKT    Enteropathogenic E coli (EPEC) DETECTED (A) NOT DETECTED Final    Comment: RESULT CALLED TO, READ BACK BY AND VERIFIED WITH: TINA TOREVILLE @1418  06/13/18 AKT    Enterotoxigenic E coli (ETEC) NOT DETECTED NOT DETECTED Final   Shiga like toxin producing E coli (STEC) NOT DETECTED NOT DETECTED Final   Shigella/Enteroinvasive E coli (EIEC) NOT DETECTED NOT DETECTED Final   Cryptosporidium NOT DETECTED NOT DETECTED Final   Cyclospora cayetanensis NOT DETECTED NOT DETECTED Final   Entamoeba histolytica NOT DETECTED NOT DETECTED Final   Giardia lamblia NOT DETECTED NOT DETECTED Final   Adenovirus F40/41 NOT DETECTED NOT DETECTED Final   Astrovirus NOT DETECTED NOT DETECTED Final   Norovirus GI/GII NOT DETECTED NOT DETECTED Final   Rotavirus A NOT DETECTED NOT DETECTED Final   Sapovirus (I, II, IV, and V) NOT DETECTED NOT DETECTED Final    Comment: Performed at [facility], [address] [place], [place] [address]  C difficile quick scan w PCR reflex     Status: Abnormal   Collection Time: 06/13/18  3:25 AM  Result Value Ref Range Status   C Diff antigen POSITIVE (A) NEGATIVE Final   C Diff toxin POSITIVE (A)  NEGATIVE Final   C Diff interpretation Toxin producing C. difficile detected.  Final    Comment: CRITICAL RESULT CALLED TO, READ BACK BY AND VERIFIED WITH: T.TOUR[place]ILLE RN AT [phone] 06/13/18 BY A.DAVIS Performed at [facility], [address], [place], [place] [address]      Liver Function Tests: No results for input(s): AST, ALT, ALKPHOS, BILITOT, PROT, ALBUMIN in the last 168 hours. No results for input(s): LIPASE, AMYLASE in the last 168 hours. No results for input(s): AMMONIA in the last 168 hours.  Cardiac Enzymes: No results for input(s): CKTOTAL, CKMB, CKMBINDEX, TROPONINI in the last 168 hours. BNP (last 3 results) Recent Labs    06/12/18 1903  BNP 189.5*    ProBNP (last 3 results) No results for  input(s): PROBNP in the last 8760 hours.    Studies: No results found.  Scheduled Meds: . acetaminophen  650 mg Oral Q8H  . apixaban  2.5 mg Oral BID  . aspirin EC  81 mg Oral Daily  . ezetimibe  10 mg Oral Daily  . feeding supplement (GLUCERNA SHAKE)  237 mL Oral BID BM  . furosemide  20 mg Oral Daily  . insulin aspart  0-9 Units Subcutaneous TID WC  . loratadine  10 mg Oral Daily  . metoprolol tartrate  25 mg Oral BID  . nutrition supplement (JUVEN)  1 packet Oral BID BM  . saccharomyces boulardii  250 mg Oral BID  . vancomycin  125 mg Oral Q6H      Chelsea Ward   [facility] Pager [phone]. If 7PM-7AM, please contact night-coverage at [email or web address], Office  [phone]  password [email or web address]  06/20/2018, 5:03 PM  LOS: 8 days

## 2018-06-20 NOTE — Plan of Care (Signed)
  Problem: Skin Integrity: Goal: Risk for impaired skin integrity will decrease Outcome: Progressing   

## 2018-06-20 NOTE — Progress Notes (Signed)
Nutrition Follow-up  DOCUMENTATION CODES:   Not applicable  INTERVENTION:    D/C Juven as stage II pressure injury has healed.  Add Glucerna Shake po BID, each supplement provides 220 kcal and 10 grams of protein  NUTRITION DIAGNOSIS:   Inadequate oral intake related to poor appetite, lethargy/confusion as evidenced by per patient/family report.  Ongoing  GOAL:   Patient will meet greater than or equal to 90% of their needs  Progressing  MONITOR:   PO intake, Supplement acceptance, Labs, Weight trends, Skin, I & O's  ASSESSMENT:    Chelsea RabonOdessa Ward  is a 84 y.o. female, w hypertension, hyperlipidemia, dm2, CAD , recent episodes of colitis apparently presented from home with c/o generalized weakness and bs of 302 per pt. Pt notes that she is having some diarrhea still.   Patient reports good intake of meals. Has also been drinking Juven supplement BID. Stage II wound to sacrum has healed.   Per documentation, patient has been consuming 50-100% of meals. Per RN, patient has been eating okay. Would benefit from a PO supplement to ensure adequate intake and support good skin integrity.   Labs and medications reviewed. CBG's: [phone]  Diet Order:   Diet Order           DIET SOFT Room service appropriate? Yes; Fluid consistency: Thin  Diet effective now          EDUCATION NEEDS:   No education needs have been identified at this time  Skin:  Skin Assessment: Skin Integrity Issues: Skin Integrity Issues:: Stage II Stage II: healed  Last BM:  7/16 (rectal tube)  Height:   Ht Readings from Last 1 Encounters:  06/13/18 5\' 4"  (1.626 m)    Weight:   Wt Readings from Last 1 Encounters:  06/20/18 135 lb 11.2 oz (61.6 kg)    Ideal Body Weight:  54.5 kg  BMI:  Body mass index is 23.29 kg/m.  Estimated Nutritional Needs:   Kcal:  1650-1850  Protein:  80-95 grams  Fluid:  >1.6 L    Joaquin CourtsKimberly Harris, RD, LDN, CNSC Pager [phone] After Hours Pager  [phone]

## 2018-06-21 LAB — CBC
HCT: 34 % — ABNORMAL LOW (ref 36.0–46.0)
HEMOGLOBIN: 10.7 g/dL — AB (ref 12.0–15.0)
MCH: 28 pg (ref 26.0–34.0)
MCHC: 31.5 g/dL (ref 30.0–36.0)
MCV: 89 fL (ref 78.0–100.0)
PLATELETS: 328 10*3/uL (ref 150–400)
RBC: 3.82 MIL/uL — ABNORMAL LOW (ref 3.87–5.11)
RDW: 15.3 % (ref 11.5–15.5)
WBC: 7.7 10*3/uL (ref 4.0–10.5)

## 2018-06-21 LAB — GLUCOSE, CAPILLARY
GLUCOSE-CAPILLARY: 147 mg/dL — AB (ref 70–99)
GLUCOSE-CAPILLARY: 200 mg/dL — AB (ref 70–99)
Glucose-Capillary: 146 mg/dL — ABNORMAL HIGH (ref 70–99)

## 2018-06-21 MED ORDER — METOPROLOL TARTRATE 25 MG PO TABS: 25.0000 mg | ORAL_TABLET | Freq: Two times a day (BID) | ORAL | 3 refills | Status: DC

## 2018-06-21 MED ORDER — POLYVINYL ALCOHOL 1.4 % OP SOLN: 1.0000 [drp] | OPHTHALMIC | 0 refills | Status: DC | PRN

## 2018-06-21 MED ORDER — GLUCERNA SHAKE PO LIQD
237.0000 mL | Freq: Two times a day (BID) | ORAL | 1 refills | Status: AC
Start: 2018-06-21 — End: ?

## 2018-06-21 MED ORDER — APIXABAN 2.5 MG PO TABS: 2.5000 mg | ORAL_TABLET | Freq: Two times a day (BID) | ORAL | 2 refills | Status: DC

## 2018-06-21 MED ORDER — ACETAMINOPHEN 325 MG PO TABS: 650.0000 mg | ORAL_TABLET | Freq: Three times a day (TID) | ORAL | 1 refills | Status: AC

## 2018-06-21 MED ORDER — SACCHAROMYCES BOULARDII 250 MG PO CAPS
250.0000 mg | ORAL_CAPSULE | Freq: Two times a day (BID) | ORAL | 3 refills | Status: AC
Start: 2018-06-21 — End: ?

## 2018-06-21 MED ORDER — ONDANSETRON 4 MG PO TBDP: 4.0000 mg | ORAL_TABLET | Freq: Three times a day (TID) | ORAL | 0 refills | Status: AC | PRN

## 2018-06-21 MED ORDER — VANCOMYCIN 50 MG/ML ORAL SOLUTION: 125.0000 mg | Freq: Four times a day (QID) | ORAL | 0 refills | Status: AC

## 2018-06-21 MED FILL — VANCOMYCIN HCL 125 MG CAP: 125 | 7 days supply | Qty: 28 | Fill #0

## 2018-06-21 NOTE — Evaluation (Signed)
Physical Therapy Evaluation Patient Details Name: Chelsea Ward MRN: [id number] DOB: June 11, 1933 Today's Date: 06/21/2018   History of Present Illness  [name] a85 y.o.female,w hypertension, hyperlipidemia, dm2, CAD , recent episodes of colitis apparently presented from home with c/o generalized weakness and bs of 302 per pt. Pt notes that she is having some diarrhea still.   Cdiff and Afib with RVR.  Clinical Impression  Pt admitted with above diagnosis. Pt currently with functional limitations due to the deficits listed below (see PT Problem List). Pt was able to ambulate in hall with RW with supervision.  No LOB and good overall safety with RW.  Will follow acutely.  Pt will benefit from skilled PT to increase their independence and safety with mobility to allow discharge to the venue listed below.    Follow Up Recommendations Home health PT;Supervision for mobility/OOB    Equipment Recommendations  None recommended by PT    Recommendations for Other Services       Precautions / Restrictions Precautions Precautions: Fall Restrictions Weight Bearing Restrictions: No      Mobility  Bed Mobility Overal bed mobility: Needs Assistance Bed Mobility: Supine to Sit     Supine to sit: Supervision;HOB elevated     General bed mobility comments: supervision for safety. increased time  Transfers Overall transfer level: Needs assistance Equipment used: Rolling walker (2 wheeled) Transfers: Sit to/from Stand Sit to Stand: Supervision            Ambulation/Gait Ambulation/Gait assistance: Supervision Gait Distance (Feet): 180 Feet Assistive device: Rolling walker (2 wheeled) Gait Pattern/deviations: Step-to pattern   Gait velocity interpretation: <1.8 ft/sec, indicate of risk for recurrent falls General Gait Details: No LOB wiht RW and good safety with RW.    Stairs            Wheelchair Mobility    Modified Rankin (Stroke Patients Only)        Balance Overall balance assessment: Needs assistance Sitting-balance support: No upper extremity supported;Feet supported Sitting balance-Leahy Scale: Fair     Standing balance support: No upper extremity supported;During functional activity Standing balance-Leahy Scale: Fair Standing balance comment: stands statically wihtout UE support                             Pertinent Vitals/Pain Pain Assessment: No/denies pain    Home Living Family/patient expects to be discharged to:: Private residence Living Arrangements: Children(son) Available Help at Discharge: Family Type of Home: House Home Access: Stairs to enter Entrance Stairs-Rails: Doctor, general practice of Steps: 6 Home Layout: One level Home Equipment: Environmental consultant - 4 wheels;Cane - single point;Grab bars - tub/shower;Grab bars - toilet Additional Comments: Pt lives with son.  Per pt report, son is with her most of the day    Prior Function Level of Independence: Independent with assistive device(s);Needs assistance   Gait / Transfers Assistance Needed: walks with rollator some on her own  ADL's / Homemaking Assistance Needed: son bathes and dresses pt and grooms pt, cooks        Hand Dominance   Dominant Hand: Right    Extremity/Trunk Assessment   Upper Extremity Assessment Upper Extremity Assessment: Defer to OT evaluation    Lower Extremity Assessment Lower Extremity Assessment: Generalized weakness    Cervical / Trunk Assessment Cervical / Trunk Assessment: Normal  Communication   Communication: HOH  Cognition Arousal/Alertness: Awake/alert Behavior During Therapy: WFL for tasks assessed/performed Overall Cognitive Status: No family/caregiver present to  determine baseline cognitive functioning                                 General Comments: Pt with disengaged affect, slow to respond. denies any concerns or needs.       General Comments      Exercises General  Exercises - Lower Extremity Ankle Circles/Pumps: Both;5 reps;AROM;Seated Long Arc Quad: AROM;Both;10 reps;Seated   Assessment/Plan    PT Assessment Patient needs continued PT services  PT Problem List Decreased activity tolerance;Decreased balance;Decreased mobility;Decreased knowledge of use of DME;Decreased safety awareness;Decreased knowledge of precautions       PT Treatment Interventions Gait training;DME instruction;Functional mobility training;Stair training;Therapeutic activities;Therapeutic exercise;Balance training;Neuromuscular re-education;Patient/family education    PT Goals (Current goals can be found in the Care Plan section)  Acute Rehab PT Goals Patient Stated Goal: to go home with son  PT Goal Formulation: With patient Time For Goal Achievement: 07/05/18 Potential to Achieve Goals: Good    Frequency Min 3X/week   Barriers to discharge        Co-evaluation               AM-PAC PT "6 Clicks" Daily Activity  Outcome Measure Difficulty turning over in bed (including adjusting bedclothes, sheets and blankets)?: None Difficulty moving from lying on back to sitting on the side of the bed? : None Difficulty sitting down on and standing up from a chair with arms (e.g., wheelchair, bedside commode, etc,.)?: None Help needed moving to and from a bed to chair (including a wheelchair)?: A Little Help needed walking in hospital room?: A Little Help needed climbing 3-5 steps with a railing? : A Lot 6 Click Score: 20    End of Session Equipment Utilized During Treatment: Gait belt Activity Tolerance: Patient tolerated treatment well Patient left: with call bell/phone within reach;in chair;with chair alarm set Nurse Communication: Mobility status PT Visit Diagnosis: Other abnormalities of gait and mobility (R26.89);Muscle weakness (generalized) (M62.81);Adult, failure to thrive (R62.7)    Time: [id number] PT Time Calculation (min) (ACUTE ONLY): 18  min   Charges:   PT Evaluation $PT Eval Moderate Complexity: 1 Mod     PT G Codes:        [name],PT Acute Rehabilitation [phone] (pager)   [name] 06/21/2018, 12:58 PM

## 2018-06-21 NOTE — Care Management Note (Signed)
Case Management Note  Patient Details  Name: Lorina RabonOdessa Parkin MRN: [id number] Date of Birth: Jan 18, 1933  Subjective/Objective: Pt presented for Atrial FIb- positive C diff. Flexiseal out and plan for home today. Pt was previously active with [facility].                    Action/Plan: CM did ask MD for resumption orders. [facility] to begin within 24-48 hours post transition home. No further needs from CM at this time.   Expected Discharge Date:  06/21/18               Expected Discharge Plan:  Home w Home Health Services  In-House Referral:  Clinical Social Work  Discharge planning Services  CM Consult  Post Acute Care Choice:  Home Health Choice offered to:  Patient, Adult Children  DME Arranged:  N/A DME Agency:  NA  HH Arranged:  PT HH Agency:  [facility]  Status of Service:  Completed, signed off  If discussed at Long Length of Stay Meetings, dates discussed:    Additional Comments:  Gala LewandowskyGraves-Bigelow, Jennise Both Kaye, RN 06/21/2018, 4:08 PM

## 2018-06-21 NOTE — Evaluation (Signed)
Occupational Therapy Evaluation Patient Details Name: Chelsea RabonOdessa Ward MRN: [id number] DOB: [birth year] Today's Date: 06/21/2018    History of Present Illness [name] a85 y.o.female,w hypertension, hyperlipidemia, dm2, CAD , recent episodes of colitis apparently presented from home with c/o generalized weakness and bs of 302 per pt. Pt notes that she is having some diarrhea still.   Cdiff and Afib with RVR.   Clinical Impression   This 84 y/o female presents with the above. At baseline pt reports she uses RW for functional mobility, reports her son assists with bathing/dressing ADLs. Pt completing room level functional mobility using RW with minguard assist this session. Currently requires modA for LB and toileting ADLs, minguard assist for seated UB ADLs; pt presenting with overall generalized weakness. Will benefit from continued acute OT services and [facility] to maximize her safety and independence with ADLs and mobility after return home.     Follow Up Recommendations  Home health OT;Supervision/Assistance - 24 hour    Equipment Recommendations  None recommended by OT           Precautions / Restrictions Precautions Precautions: Fall Restrictions Weight Bearing Restrictions: No      Mobility Bed Mobility               General bed mobility comments: OOB in recliner   Transfers Overall transfer level: Needs assistance Equipment used: Rolling walker (2 wheeled) Transfers: Sit to/from Stand Sit to Stand: Supervision         General transfer comment: supervision for safety; intermittent VCs for hand placement     Balance Overall balance assessment: Needs assistance Sitting-balance support: No upper extremity supported;Feet supported Sitting balance-Leahy Scale: Fair     Standing balance support: No upper extremity supported;During functional activity Standing balance-Leahy Scale: Fair Standing balance comment:  stands statically without UE support                           ADL either performed or assessed with clinical judgement   ADL Overall ADL's : Needs assistance/impaired Eating/Feeding: Independent   Grooming: Set up;Sitting   Upper Body Bathing: Min guard;Sitting   Lower Body Bathing: Minimal assistance;Sit to/from stand   Upper Body Dressing : Min guard;Sitting   Lower Body Dressing: Moderate assistance;Sit to/from stand Lower Body Dressing Details (indicate cue type and reason): difficulty accessing feet  Toilet Transfer: Minimal assistance;Ambulation;BSC;RW;Min guard   Toileting- Clothing Manipulation and Hygiene: Moderate assistance;Sit to/from stand Toileting - Clothing Manipulation Details (indicate cue type and reason): assist for pericare after voiding bladder      Functional mobility during ADLs: Min guard;Rolling walker General ADL Comments: pt presenting with generalized weakness; ambulating in room from recliner to doorway and back, after returning to recliner pt reporting need to void bladder, [facility]      Vision         Perception     Praxis      Pertinent Vitals/Pain Pain Assessment: No/denies pain     Hand Dominance Right   Extremity/Trunk Assessment Upper Extremity Assessment Upper Extremity Assessment: Generalized weakness   Lower Extremity Assessment Lower Extremity Assessment: Defer to PT evaluation   Cervical / Trunk Assessment Cervical / Trunk Assessment: Normal   Communication Communication Communication: HOH   Cognition Arousal/Alertness: Awake/alert Behavior During Therapy: WFL for tasks assessed/performed;Flat affect Overall Cognitive Status: No family/caregiver present to determine baseline cognitive functioning  General Comments: Pt with disengaged affect, slow to respond. denies any concerns or needs. question whether partly due to pt  is [name]    General Comments       Exercises     Shoulder Instructions      Home Living Family/patient expects to be discharged to:: Private residence Living Arrangements: Children(son) Available Help at Discharge: Family Type of Home: House Home Access: Stairs to enter [employer] of Steps: 6 Entrance Stairs-Rails: Right;Left Home Layout: One level     Bathroom Shower/Tub: Chief Strategy Officer: Standard     Home Equipment: Environmental consultant - 4 wheels;Cane - single point;Grab bars - tub/shower;Grab bars - toilet;Bedside commode;Shower seat   Additional Comments: Pt lives with son.  Per pt report, son is with her most of the day      Prior Functioning/Environment    Gait / Transfers Assistance Needed: walks with rollator some on her own ADL's / Homemaking Assistance Needed: son bathes and dresses pt and grooms pt, cooks            OT Problem List: Decreased strength;Decreased activity tolerance;Impaired balance (sitting and/or standing);Decreased knowledge of use of DME or AE      OT Treatment/Interventions: Self-care/ADL training;Therapeutic exercise;DME and/or AE instruction;Therapeutic activities;Balance training;Patient/family education    OT Goals(Current goals can be found in the care plan section) Acute Rehab OT Goals Patient Stated Goal: to go home with son  OT Goal Formulation: With patient Time For Goal Achievement: 07/05/18 Potential to Achieve Goals: Good  OT Frequency: Min 2X/week   Barriers to D/C:            Co-evaluation              AM-PAC PT "6 Clicks" Daily Activity     Outcome Measure Help from another person eating meals?: None Help from another person taking care of personal grooming?: A Little Help from another person toileting, which includes using toliet, bedpan, or urinal?: A Lot Help from another person bathing (including washing, rinsing, drying)?: A Little Help from another person to put on and taking off  regular upper body clothing?: A Little Help from another person to put on and taking off regular lower body clothing?: A Lot 6 Click Score: 17   End of Session Equipment Utilized During Treatment: Rolling walker;Gait belt Nurse Communication: Mobility status  Activity Tolerance: Patient tolerated treatment well Patient left: in chair;with call bell/phone within reach;with chair alarm set  OT Visit Diagnosis: Muscle weakness (generalized) (M62.81)                Time: 4098-1191 OT Time Calculation (min): 22 min Charges:  OT General Charges $OT Visit: 1 Visit OT Evaluation $OT Eval Moderate Complexity: 1 Mod G-Codes:     Marcy Siren, OT Pager [phone] 06/21/2018  Orlando Penner 06/21/2018, 2:17 PM

## 2018-06-21 NOTE — Discharge Summary (Addendum)
Chelsea Ward, is a 84 y.o. female  DOB Nov 04, 1933  MRN [id number].  Admission date:  06/12/2018  Admitting Physician  Pearson Grippe, MD  Discharge Date:  06/21/2018   Primary MD  Deeann Saint, MD  Recommendations for primary care physician for things to follow:   1)You are taking a blood thinner called Eliquis/apixaban so Avoid ibuprofen/Advil/Aleve/Motrin/Goody Powders/Naproxen/BC powders/Meloxicam/Diclofenac/Indomethacin and other Nonsteroidal anti-inflammatory medications as these will make you more likely to bleed and can cause stomach ulcers, can also cause Kidney problems.   2)Take all other medications as prescribed  3)Probiotic and Lactobacillus strongly encouraged  4)Repeat CBC and BMP blood test around 06/25/2018   Admission Diagnosis  hyperglycemia loss of appetite diarrhea   Discharge Diagnosis  hyperglycemia loss of appetite diarrhea    Principal Problem:   Atrial fibrillation with RVR (HCC) Active Problems:   Diarrhea   Diabetes mellitus type 2 in nonobese (HCC)   Pressure injury of skin   Acute on chronic combined systolic and diastolic CHF (congestive heart failure) (HCC)      Past Medical History:  Diagnosis Date  . Arthritis    "arms, knees" (06/12/2018)  . Atrial fibrillation with RVR (HCC) 06/12/2018  . CHF (congestive heart failure) (HCC)   . Heart disease   . Hypercholesteremia   . Hyperlipidemia   . Hypertension   . Type II diabetes mellitus (HCC)     Past Surgical History:  Procedure Laterality Date  . CATARACT EXTRACTION, BILATERAL Bilateral   . COLON SURGERY     "my bowels lock"  . TUBAL LIGATION    . VAGINAL HYSTERECTOMY       HPI  from the history and physical done on the day of admission:   Chelsea Ward  is a 84 y.o. female, w hypertension, hyperlipidemia, dm2, CAD , recent episodes of colitis apparently presented from home with c/o generalized  weakness and bs of 302 per pt. Pt notes that she is having some diarrhea still.   In ED,   Na 136, K 2.7,  Bun 12, Creatinine 1.24  Wbc 16.9, Hgb 12.0, Plt 225  BNP 189.5  Trop 0.10  EKG Afib  Pt will be admitted for hyperglycemia, hypokalemia and Afib with RVR and trop elevation.     Hospital Course:     Brief Summary 84 year old female with history of hypertension, hyperlipidemia, diabetes mellitus, CAD, recent episodes of colitis came from home with complaints of generalized weakness and hypoglycemia with blood glucose 302.  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  Started on oral vancomycin.  Patient found to be in A. fib during this admission  Plan:- 1. Atrial Fibrillation with RVR-new onset, it appears to be back in sinus rhythm at this time, overall stable, continue Eliquis 2.5 mg twice daily for stroke prophylaxis, continue metoprolol 25 mg twice daily for rate control   2. C. difficile diarrhea-   overall much improved, frequency, volume and consistency of stools have improved significantly, patient has positive C. difficile toxin as well as C. difficile  antigen.  Continue vancomycin 125 mg 4 times a day for a total of 2 weeks.   3)diabetes mellitus-last A1c 7.1,  Januvia as ordered, use NovoLog sliding scale u   4) dyslipidemia--c/n zetia and aspirin   Discharge Condition: stable  Follow UP-PCP as advised  Follow-up Information    [facility] Follow up.   Specialty:  Home Health Services Why:  Physical Therapy Contact information: [address] [place] [address] [phone]          Consults obtained -cardiology  Diet and Activity recommendation:  As advised  Discharge Instructions    Discharge Instructions    Call MD for:  difficulty breathing, headache or visual disturbances   Complete by:  As directed    Call MD for:  persistant dizziness or light-headedness   Complete by:  As directed    Call MD  for:  persistant nausea and vomiting   Complete by:  As directed    Call MD for:  severe uncontrolled pain   Complete by:  As directed    Call MD for:  temperature >100.4   Complete by:  As directed    Diet - low sodium heart healthy   Complete by:  As directed    Discharge instructions   Complete by:  As directed    1)You are taking a blood thinner called Eliquis/apixaban so Avoid ibuprofen/Advil/Aleve/Motrin/Goody Powders/Naproxen/BC powders/Meloxicam/Diclofenac/Indomethacin and other Nonsteroidal anti-inflammatory medications as these will make you more likely to bleed and can cause stomach ulcers, can also cause Kidney problems.   2)Take all other medications as prescribed  3)Probiotic and Lactobacillus strongly encouraged   Increase activity slowly   Complete by:  As directed        Discharge Medications     Allergies as of 06/21/2018   No Known Allergies     Medication List    TAKE these medications   acetaminophen 325 MG tablet Commonly known as:  TYLENOL Take 2 tablets (650 mg total) by mouth every 8 (eight) hours.   apixaban 2.5 MG Tabs tablet Commonly known as:  ELIQUIS Take 1 tablet (2.5 mg total) by mouth 2 (two) times daily.   aspirin EC 81 MG tablet Take 81 mg by mouth daily.   ezetimibe 10 MG tablet Commonly known as:  ZETIA Take 10 mg by mouth daily.   feeding supplement (GLUCERNA SHAKE) Liqd Take 237 mLs by mouth 2 (two) times daily between meals.   fexofenadine 60 MG tablet Commonly known as:  ALLEGRA Take 1 tablet (60 mg total) by mouth daily.   Fish Oil 500 MG Caps Take 1 capsule by mouth 2 (two) times daily.   furosemide 20 MG tablet Commonly known as:  LASIX Take 1 tablet (20 mg total) by mouth daily.   JANUVIA 100 MG tablet Generic drug:  sitaGLIPtin Take 100 mg by mouth daily.   metoprolol tartrate 25 MG tablet Commonly known as:  LOPRESSOR Take 1 tablet (25 mg total) by mouth 2 (two) times daily.   ondansetron 4 MG  disintegrating tablet Commonly known as:  ZOFRAN ODT Take 1 tablet (4 mg total) by mouth every 8 (eight) hours as needed for nausea or vomiting.   polyvinyl alcohol 1.4 % ophthalmic solution Commonly known as:  LIQUIFILM TEARS Place 1 drop into both eyes as needed for dry eyes.   saccharomyces boulardii 250 MG capsule Commonly known as:  FLORASTOR Take 1 capsule (250 mg total) by mouth 2 (two) times daily.  vancomycin 50 mg/mL oral solution Commonly known as:  VANCOCIN Take 2.5 mLs (125 mg total) by mouth 4 (four) times daily for 7 days.       Major procedures and Radiology Reports - PLEASE review detailed and final reports for all details, in brief -   Dg Chest Portable 1 View  Result Date: 06/12/2018 CLINICAL DATA:  Weakness EXAM: PORTABLE CHEST 1 VIEW COMPARISON:  05/21/2018 FINDINGS: Borderline heart size, stable. Mild aortic tortuosity. Interstitial coarsening at the bases is stable from 05/15/2018 when there was no acute disease in the lower chest by abdominal CT. No effusion or pneumothorax. IMPRESSION: Stable from prior.  No evidence of acute disease. Electronically Signed   By: Marnee Spring M.D.   On: 06/12/2018 19:06    Micro Results   Recent Results (from the past 240 hour(s))  MRSA PCR Screening     Status: None   Collection Time: 06/12/18 11:27 PM  Result Value Ref Range Status   MRSA by PCR NEGATIVE NEGATIVE Final    Comment:        The GeneXpert MRSA Assay (FDA approved for NASAL specimens only), is one component of a comprehensive MRSA colonization surveillance program. It is not intended to diagnose MRSA infection nor to guide or monitor treatment for MRSA infections. Performed at [facility], [address] [place], [place] [address]   Gastrointestinal Panel by PCR , Stool     Status: Abnormal   Collection Time: 06/13/18  3:25 AM  Result Value Ref Range Status   Campylobacter species NOT DETECTED NOT DETECTED Final   Plesimonas  shigelloides NOT DETECTED NOT DETECTED Final   Salmonella species NOT DETECTED NOT DETECTED Final   Yersinia enterocolitica NOT DETECTED NOT DETECTED Final   Vibrio species NOT DETECTED NOT DETECTED Final   Vibrio cholerae NOT DETECTED NOT DETECTED Final   Enteroaggregative E coli (EAEC) DETECTED (A) NOT DETECTED Final    Comment: RESULT CALLED TO, READ BACK BY AND VERIFIED WITH: TINA TOREVILLE @1418  06/13/18 AKT    Enteropathogenic E coli (EPEC) DETECTED (A) NOT DETECTED Final    Comment: RESULT CALLED TO, READ BACK BY AND VERIFIED WITH: TINA TOREVILLE @1418  06/13/18 AKT    Enterotoxigenic E coli (ETEC) NOT DETECTED NOT DETECTED Final   Shiga like toxin producing E coli (STEC) NOT DETECTED NOT DETECTED Final   Shigella/Enteroinvasive E coli (EIEC) NOT DETECTED NOT DETECTED Final   Cryptosporidium NOT DETECTED NOT DETECTED Final   Cyclospora cayetanensis NOT DETECTED NOT DETECTED Final   Entamoeba histolytica NOT DETECTED NOT DETECTED Final   Giardia lamblia NOT DETECTED NOT DETECTED Final   Adenovirus F40/41 NOT DETECTED NOT DETECTED Final   Astrovirus NOT DETECTED NOT DETECTED Final   Norovirus GI/GII NOT DETECTED NOT DETECTED Final   Rotavirus A NOT DETECTED NOT DETECTED Final   Sapovirus (I, II, IV, and V) NOT DETECTED NOT DETECTED Final    Comment: Performed at [facility], [address] [place], [place] [address]  C difficile quick scan w PCR reflex     Status: Abnormal   Collection Time: 06/13/18  3:25 AM  Result Value Ref Range Status   C Diff antigen POSITIVE (A) NEGATIVE Final   C Diff toxin POSITIVE (A) NEGATIVE Final   C Diff interpretation Toxin producing C. difficile detected.  Final    Comment: CRITICAL RESULT CALLED TO, READ BACK BY AND VERIFIED WITH: T.TOURVILLE RN AT [id number] 06/13/18 BY A.DAVIS Performed at [facility], [address] [place], [place] [address]    Today   Subjective    Chelsea Ward today has no new complaints, eating and  drinking okay, no shortness of breath, no chest pain, no fevers, no nausea.  Volume, frequency and consistency of stools have improved significantly          Patient has been seen and examined prior to discharge   Objective   Blood pressure (!) 148/81, pulse 71, temperature 98.4 F (36.9 C), temperature source Oral, resp. rate 15, height 5\' 4"  (1.626 m), weight 61 kg (134 lb 8 oz), SpO2 100 %.   Intake/Output Summary (Last 24 hours) at 06/21/2018 1619 Last data filed at 06/21/2018 1056 Gross per 24 hour  Intake 480 ml  Output 550 ml  Net -70 ml    Exam Gen:- Awake Alert, in no acute distress HEENT:- [place].AT, No sclera icterus Neck-Supple Neck,No JVD,.  Lungs-  CTAB , good air movement CV- S1, S2 normal, history of atrial fibrillation Abd-  +ve B.Sounds, Abd Soft, No tenderness,    Extremity/Skin:- No  edema,   good pulses Psych-affect is appropriate, oriented x3 Neuro-no new focal deficits, no tremors   Data Review   CBC w Diff:  Lab Results  Component Value Date   WBC 7.7 06/21/2018   HGB 10.7 (L) 06/21/2018   HCT 34.0 (L) 06/21/2018   PLT 328 06/21/2018   LYMPHOPCT 9 05/16/2018   MONOPCT 9 05/16/2018   EOSPCT 2 05/16/2018   BASOPCT 0 05/16/2018    CMP:  Lab Results  Component Value Date   NA 140 06/19/2018   K 3.7 06/19/2018   CL 106 06/19/2018   CO2 25 06/19/2018   BUN 28 (H) 06/19/2018   CREATININE 0.87 06/19/2018   PROT 6.4 (L) 06/13/2018   ALBUMIN 2.5 (L) 06/13/2018   BILITOT 0.7 06/13/2018   ALKPHOS 43 06/13/2018   AST 17 06/13/2018   ALT 21 06/13/2018  .   Total Discharge time is about 33 minutes  Shon Hale M.D on 06/21/2018 at 4:19 PM   Go to [email or web address] - password [email or web address] for contact info  [facility] - Office  [phone]

## 2018-06-21 NOTE — Care Management Important Message (Signed)
Important Message  Patient Details  Name: Chelsea Ward MRN: [id number] Date of Birth: 15-Jul-1933   Medicare Important Message Given:  Yes    Gala LewandowskyGraves-Bigelow, Charley Lafrance Kaye, RN 06/21/2018, 4:05 PM

## 2018-06-22 ENCOUNTER — Telehealth: Payer: Self-pay | Admitting: *Deleted

## 2018-06-22 ENCOUNTER — Telehealth: Payer: Self-pay | Admitting: Family Medicine

## 2018-06-22 NOTE — Telephone Encounter (Signed)
Copied from CRM [phone]. Topic: Quick Communication - See Telephone Encounter >> Jun 22, 2018  2:51 PM Waymon AmatoBurton, Donna F wrote: Chelsea MightyJerilynn Ward is requesting home health  verbal orders 1 time a week for 1 week, then 2 times a week for 2 weeks    [phone]

## 2018-06-22 NOTE — Telephone Encounter (Signed)
Admission date:  06/12/2018   Admitting Physician  Pearson GrippeJames Kim, MD  Discharge Date:  06/21/2018   Transition Care Management Follow-up Telephone Call   Date discharged? 06/21/18   How have you been since you were released from the hospital? "Im doing good"   Do you understand why you were in the hospital? yes   Do you understand the discharge instructions? yes, Medicaid will not cover the Probiotic medication she was prescribed - needs something else that is similar.   Where were you discharged to? Home - with Al CorpusSon, [place]   Items Reviewed:  Medications reviewed: yes,  Medicaid will not cover the Probiotic medication she was prescribed - needs something else that is similar. Pt is wanting to switch back to Reglan 10mg  (former medication) or something else he is recommends  Allergies reviewed: yes  Dietary changes reviewed: yes  Referrals reviewed: yes   Functional Questionnaire:   Activities of Daily Living (ADLs):   She states they are independent in the following: not yet back to being able to doing everything for herself States they require assistance with the following: ambulation, bathing and hygiene, feeding, continence, grooming, toileting and dressing   Any transportation issues/concerns?: no   Any patient concerns? yes,  Medicaid will not cover the Probiotic medication she was prescribed - needs something else that is similar.     Confirmed importance and date/time of follow-up visits scheduled yes  Provider Appointment booked with  Confirmed with patient if condition begins to worsen call PCP or go to the ER.  Patient was given the office number and encouraged to call back with question or concerns.  : yes

## 2018-06-22 NOTE — Telephone Encounter (Signed)
Pt can try an OTC probiotic.

## 2018-06-22 NOTE — Telephone Encounter (Signed)
Will send to Dr Salomon FickBanks to advise on medication issue  Patient prescribed Florastor in the hospital, Medicaid will not cover the Probiotic medication she was prescribed - needs something else that is similar. Pt is wanting to switch back to Reglan 10mg  (former medication) or something else recommended.

## 2018-06-25 ENCOUNTER — Ambulatory Visit (INDEPENDENT_AMBULATORY_CARE_PROVIDER_SITE_OTHER): Payer: Medicare Other | Admitting: Family Medicine

## 2018-06-25 ENCOUNTER — Encounter: Payer: Self-pay | Admitting: Family Medicine

## 2018-06-25 ENCOUNTER — Telehealth: Payer: Self-pay | Admitting: Family Medicine

## 2018-06-25 VITALS — BP 102/68 | HR 62 | Temp 97.5°F | Wt 133.0 lb

## 2018-06-25 DIAGNOSIS — A0472 Enterocolitis due to Clostridium difficile, not specified as recurrent: Secondary | ICD-10-CM

## 2018-06-25 DIAGNOSIS — Z09 Encounter for follow-up examination after completed treatment for conditions other than malignant neoplasm: Secondary | ICD-10-CM

## 2018-06-25 DIAGNOSIS — L608 Other nail disorders: Secondary | ICD-10-CM

## 2018-06-25 LAB — CBC
HEMATOCRIT: 37.1 % (ref 36.0–46.0)
HEMOGLOBIN: 12.1 g/dL (ref 12.0–15.0)
MCHC: 32.7 g/dL (ref 30.0–36.0)
MCV: 87.8 fl (ref 78.0–100.0)
Platelets: 420 10*3/uL — ABNORMAL HIGH (ref 150.0–400.0)
RBC: 4.23 Mil/uL (ref 3.87–5.11)
RDW: 16.5 % — ABNORMAL HIGH (ref 11.5–15.5)
WBC: 7.1 10*3/uL (ref 4.0–10.5)

## 2018-06-25 LAB — BASIC METABOLIC PANEL
BUN: 23 mg/dL (ref 6–23)
CALCIUM: 9.3 mg/dL (ref 8.4–10.5)
CO2: 30 meq/L (ref 19–32)
CREATININE: 1.22 mg/dL — AB (ref 0.40–1.20)
Chloride: 97 mEq/L (ref 96–112)
GFR: 54.07 mL/min — ABNORMAL LOW (ref >60.00)
Glucose, Bld: 139 mg/dL — ABNORMAL HIGH (ref 70–99)
Potassium: 4.6 mEq/L (ref 3.5–5.1)
Sodium: 136 mEq/L (ref 135–145)

## 2018-06-25 NOTE — Telephone Encounter (Unsigned)
Copied from CRM #[id number]. Topic: Quick Communication - See Telephone Encounter >> Jun 22, 2018  2:51 PM Burton, Donna F wrote: Chelsea Ward is requesting home health  verbal orders 1 time a week for 1 week, then 2 times a week for 2 weeks    [phone] 

## 2018-06-25 NOTE — Patient Instructions (Signed)
Clostridium Difficile Infection   Clostridium difficile (C. difficile or C. diff) infection causes inflammation of the large intestine (colon). This condition can result in damage to the lining of your colon and may lead to another condition called colitis. This infection can be passed from person to person (is contagious).  Follow these instructions at home:  Eating and drinking   · Drink enough fluid to keep your pee (urine) clear or pale yellow.  · Avoid drinking:  ? Milk.  ? Caffeine.  ? Alcohol.  · Follow exact instructions from your doctor about how to get enough fluid in your body (rehydrate).  · Eat small meals often instead of large meals.  Medicines   · Take your antibiotic medicine as told by your doctor. Do not stop taking the antibiotic even if you start to feel better unless your doctor told you to do that.  · Take over-the-counter and prescription medicines only as told by your doctor.  · Do not use medicines to help with watery poop (diarrhea).  General instructions   · Wash your hands fully before you prepare food and after you use the bathroom. Make sure people who live with you also wash their  · hands often.  · Clean the surfaces that you touch. Use a product that contains chlorine bleach.  · Keep all follow-up visits as told by your doctor. This is important.  Contact a doctor if:  · Your symptoms do not get better with treatment.  · Your symptoms get worse with treatment.  · Your symptoms go away and then come back.  · You have a fever.  · You have new symptoms.  Get help right away if:  · You have more pain or tenderness in your belly (abdomen).  · Your poop (stool) is mostly bloody.  · Your poop looks dark black and tarry.  · You cannot eat or drink without throwing up (vomiting).  · You have signs of dehydration, such as:  ? Dark pee, very little pee, or no pee.  ? Cracked lips.  ? Not making tears when you cry.  ? Dry mouth.  ? Sunken eyes.  ? Feeling sleepy.  ? Feeling weak.  ? Feeling  dizzy.  This information is not intended to replace advice given to you by your health care provider. Make sure you discuss any questions you have with your health care provider.  Document Released: 09/18/2009 Document Revised: 04/28/2016 Document Reviewed: 05/25/2015  Elsevier Interactive Patient Education © 2017 [employer]

## 2018-06-25 NOTE — Progress Notes (Signed)
Subjective:    Patient ID: Chelsea Ward, female    DOB: 03/15/1933, 84 y.o.   MRN: [id number]  No chief complaint on file.   HPI Patient was seen today for HFU.  Pt admitted 7/9-7/18/2019 for new onset A. fib with RVR, hyperglycemia, and recurrent C. diff.  She was started and she was started on p.o. Vancomycin.  Patient to continue Eliquis 2.5 mg twice daily and metoprolol 25 mg twice daily. Pt d/c'd on po Vanc QID x 1 wk and probiotics.  Since d/c pt is doing better each day.  Her appetite is still so so, but she has been drinking water and supplemental shakes.  FSBS in am 167. Pt endorses abdominal soreness.  Her diarrhea is going away, stools becoming more solid.  Pt's son wants to make sure its gone.  Pt taking eliquis 2.5 mg BID.  No blood noted in stools.  Pt's son requesting her toenails be trimmed.  Pt hit her R great toe against her walker the other day.  Pt states the toe is sore.  Of note pt had an episode of weakness/presyncope after holding her head down while getting her hair done, then standing up.  Pt's son put her in the bed.  She did not have syncope or LOC. Pt felt better shortly after the episode. Past Medical History:  Diagnosis Date  . Arthritis    "arms, knees" (06/12/2018)  . Atrial fibrillation with RVR (HCC) 06/12/2018  . CHF (congestive heart failure) (HCC)   . Heart disease   . Hypercholesteremia   . Hyperlipidemia   . Hypertension   . Type II diabetes mellitus (HCC)     No Known Allergies  ROS General: Denies fever, chills, night sweats, changes in weight, changes in appetite  + fatigue HEENT: Denies headaches, ear pain, changes in vision, rhinorrhea, sore throat CV: Denies CP, palpitations, SOB, orthopnea Pulm: Denies SOB, cough, wheezing GI: Denies abdominal pain, nausea, vomiting, diarrhea, constipation  + abdominal soreness, diarrhea 2/2 C. diff GU: Denies dysuria, hematuria, frequency, vaginal discharge Msk: Denies muscle cramps, joint  pains Neuro: Denies weakness, numbness, tingling Skin: Denies rashes, bruising  + soreness of right great toe Psych: Denies depression, anxiety, hallucinations     Objective:    Blood pressure 102/68, pulse 62, temperature (!) 97.5 F (36.4 C), temperature source Oral, weight 133 lb (60.3 kg), SpO2 94 %.   Gen. Pleasant, well-nourished, in no distress, normal affect   HEENT: [place]/AT, face symmetric, no scleral icterus, PERRLA, nares patent without drainage Lungs: no accessory muscle use, CTAB, no wheezes or rales Cardiovascular: RRR, no m/r/g, no peripheral edema Abdomen: BS present, soft, ND, mild diffuse TTP. Neuro:  A&Ox3, CN II-XII intact, using walker for assistance with ambulation Skin:  Warm, no lesions/ rash.  R great toe nail overgrown, mildly TTP of top of nail.  No erythema, edema, or drainage noted.  Normal MTP joint, no pain with movement of toe.   Wt Readings from Last 3 Encounters:  06/25/18 133 lb (60.3 kg)  06/21/18 134 lb 8 oz (61 kg)  06/09/18 140 lb (63.5 kg)    Lab Results  Component Value Date   WBC 7.7 06/21/2018   HGB 10.7 (L) 06/21/2018   HCT 34.0 (L) 06/21/2018   PLT 328 06/21/2018   GLUCOSE 147 (H) 06/19/2018   CHOL 99 06/13/2018   TRIG 63 06/13/2018   HDL 33 (L) 06/13/2018   LDLCALC 53 06/13/2018   ALT 21 06/13/2018   AST 17  06/13/2018   NA 140 06/19/2018   K 3.7 06/19/2018   CL 106 06/19/2018   CREATININE 0.87 06/19/2018   BUN 28 (H) 06/19/2018   CO2 25 06/19/2018   TSH 1.606 06/13/2018   HGBA1C 7.1 (H) 06/13/2018    Assessment/Plan:  Hospital discharge follow-up [facility] notes and discharge phone call reviewed  C. difficile colitis  -Continue vancomycin 50 mg/mL soln 2.5 ml 4 times daily -Discussed frequent handwashing -Given handout - Plan: CBC (no diff), Basic metabolic panel  Toenail deformity -Toenail overgrown -We will refer to podiatry for trimming - Plan: Ambulatory referral to Podiatry  Follow-up PRN in the next  few weeks.  Will call with lab results.  Abbe Amsterdam, MD

## 2018-06-25 NOTE — Telephone Encounter (Signed)
ok 

## 2018-06-25 NOTE — Telephone Encounter (Signed)
Sent to PCP for the OK for verbal orders  

## 2018-06-25 NOTE — Telephone Encounter (Signed)
LM for Son, Chelsea CoveMilton Ward, making aware that the patient can try OTC Probiotic  Nothing further needed.

## 2018-06-26 NOTE — Telephone Encounter (Signed)
Sent to PCP for the OK of verbal orders  

## 2018-06-26 NOTE — Telephone Encounter (Signed)
ok 

## 2018-06-26 NOTE — Telephone Encounter (Signed)
Called [facility] and gave th OK for verbal orders requested.

## 2018-06-27 NOTE — Telephone Encounter (Signed)
Spoke with Youlanda MightyJerilynn Bathgate from Home health, gave verbal orders as requested per Dr Salomon FickBanks

## 2018-07-09 ENCOUNTER — Encounter: Payer: Self-pay | Admitting: Family Medicine

## 2018-07-09 ENCOUNTER — Ambulatory Visit (INDEPENDENT_AMBULATORY_CARE_PROVIDER_SITE_OTHER): Payer: Medicare Other | Admitting: Family Medicine

## 2018-07-09 ENCOUNTER — Telehealth: Payer: Self-pay | Admitting: Family Medicine

## 2018-07-09 VITALS — BP 92/68 | HR 74 | Temp 97.4°F | Wt 130.0 lb

## 2018-07-09 DIAGNOSIS — A0472 Enterocolitis due to Clostridium difficile, not specified as recurrent: Secondary | ICD-10-CM | POA: Diagnosis not present

## 2018-07-09 DIAGNOSIS — I959 Hypotension, unspecified: Secondary | ICD-10-CM | POA: Diagnosis not present

## 2018-07-09 DIAGNOSIS — I509 Heart failure, unspecified: Secondary | ICD-10-CM | POA: Diagnosis not present

## 2018-07-09 MED ORDER — METOPROLOL TARTRATE 25 MG PO TABS: 12.5000 mg | ORAL_TABLET | Freq: Two times a day (BID) | ORAL | 3 refills | Status: DC

## 2018-07-09 NOTE — Progress Notes (Signed)
Subjective:    Patient ID: Chelsea Ward, female    DOB: Jan 28, 1933, 84 y.o.   MRN: [id number]  No chief complaint on file. Pt accompanied by her son.  HPI Patient was seen today for follow-up.  Since last OFV on 06/25/2018, pt has been drinking more fluids per day.  Now up to 3 bottles of water per day.  Pt continues to have diarrhea, 6 times per day, if not more, per her son.  Stools are still malodorous, but pt able to make it to the bathroom.  Had one accident.  Pt finished 2-week course of vancomycin.  Pt son inquires about home health aid.  Physical therapy is currently going to the house.  Pt taking metoprolol 25 mg twice daily and Lasix 20 mg daily.  She has not seen cardiology since being out of the hospital.  Son is still concerned about pt becoming dizzy again.  Not currently checking bp at home.  Past Medical History:  Diagnosis Date  . Arthritis    "arms, knees" (06/12/2018)  . Atrial fibrillation with RVR (HCC) 06/12/2018  . CHF (congestive heart failure) (HCC)   . Heart disease   . Hypercholesteremia   . Hyperlipidemia   . Hypertension   . Type II diabetes mellitus (HCC)     No Known Allergies  ROS General: Denies fever, chills, night sweats, changes in weight, changes in appetite HEENT: Denies headaches, ear pain, changes in vision, rhinorrhea, sore throat CV: Denies CP, palpitations, SOB, orthopnea Pulm: Denies SOB, cough, wheezing GI: Denies abdominal pain, nausea, vomiting, constipation + diarrhea GU: Denies dysuria, hematuria, frequency, vaginal discharge Msk: Denies muscle cramps, joint pains Neuro: Denies weakness, numbness, tingling Skin: Denies rashes, bruising Psych: Denies depression, anxiety, hallucinations     Objective:    Blood pressure 92/68, pulse 74, temperature (!) 97.4 F (36.3 C), temperature source Oral, weight 130 lb (59 kg), SpO2 98 %.   Gen. Pleasant, well-nourished, in no distress, normal affect   HEENT: [place]/AT, face symmetric, no  scleral icterus, PERRLA, nares patent without drainage, pharynx without erythema or exudate. Lungs: no accessory muscle use, CTAB, no wheezes or rales Cardiovascular: RRR, no m/r/g, no peripheral edema Abdomen: BS present, soft, NT/ND Neuro:  A&Ox3, CN II-XII intact, using walker for ambulation. Skin:  Warm, dry, intact, no lesions/ rash   Wt Readings from Last 3 Encounters:  07/09/18 130 lb (59 kg)  06/25/18 133 lb (60.3 kg)  06/21/18 134 lb 8 oz (61 kg)    Lab Results  Component Value Date   WBC 7.1 06/25/2018   HGB 12.1 06/25/2018   HCT 37.1 06/25/2018   PLT 420.0 (H) 06/25/2018   GLUCOSE 139 (H) 06/25/2018   CHOL 99 06/13/2018   TRIG 63 06/13/2018   HDL 33 (L) 06/13/2018   LDLCALC 53 06/13/2018   ALT 21 06/13/2018   AST 17 06/13/2018   NA 136 06/25/2018   K 4.6 06/25/2018   CL 97 06/25/2018   CREATININE 1.22 (H) 06/25/2018   BUN 23 06/25/2018   CO2 30 06/25/2018   TSH 1.606 06/13/2018   HGBA1C 7.1 (H) 06/13/2018    Assessment/Plan:  C. difficile enteritis  -given concern for continued symptoms we will obtain repeat C. difficile testing.  Given supplies to collect a sample. -Encouraged to try probiotic - Plan: C Difficile Quick Screen w PCR reflex -Based on C. difficile stool results pt may require an additional round of antibiotics  Hypotension, unspecified hypotension type -Likely 2/2 decreased volume and  medications. -Patient to increase p.o. intake of fluids -We will decrease metoprolol from 25 mg twice daily to 12.5 mg twice daily to see if this improves patient's dizziness. -Patient and son encouraged to buy a blood pressure cuff to use at home. -encouraged to keep follow-up with cardiology, given appointment time and date 08/01/18.  Congestive heart failure, unspecified HF chronicity, unspecified heart failure type (HCC) -We will decrease metoprolol to 12.5 mg twice daily. -continue lasix 20 mg daily. - Plan: metoprolol tartrate (LOPRESSOR) 25 MG  tablet, take one half tab twice daily  Follow-up PRN  More than 50% of over 20 minutes spent in total in caring for this patient was spent face-to-face with the patient, counseling and/or coordinating care.    Abbe Amsterdam, MD

## 2018-07-09 NOTE — Telephone Encounter (Signed)
Copied from CRM [phone]. Topic: Inquiry >> Jul 09, 2018 10:33 AM Alexander BergeronBarksdale, Harvey B wrote: Reason for CRM: [facility], needing verbal orders; contact [phone]

## 2018-07-09 NOTE — Patient Instructions (Addendum)
Your dose of metoprolol has been decreased to 12.5 mg twice a day.  This has been done to see if your dizziness and blood pressure improved.  It is still important that you drink plenty of fluids throughout the day.  You have a follow-up appointment with cardiology on 08/01/2018.  It is important that you keep this appointment.    If you have not been doing so already you should take a probiotic to help with your diarrhea.  A probiotic can be found over-the-counter at your local drugstore.  You can ask your pharmacist for help finding one.  The probiotic helps replace the good bacteria that has been lost in your gut.     Clostridium Difficile Infection Clostridium difficile (C. difficile or C. diff) infection causes inflammation of the large intestine (colon). This condition can result in damage to the lining of your colon and may lead to another condition called colitis. This infection can be passed from person to person (is contagious). Follow these instructions at home: Eating and drinking  Drink enough fluid to keep your pee (urine) clear or pale yellow.  Avoid drinking: ? Milk. ? Caffeine. ? Alcohol.  Follow exact instructions from your doctor about how to get enough fluid in your body (rehydrate).  Eat small meals often instead of large meals. Medicines  Take your antibiotic medicine as told by your doctor. Do not stop taking the antibiotic even if you start to feel better unless your doctor told you to do that.  Take over-the-counter and prescription medicines only as told by your doctor.  Do not use medicines to help with watery poop (diarrhea). General instructions  Wash your hands fully before you prepare food and after you use the bathroom. Make sure people who live with you also wash their  hands often.  Clean the surfaces that you touch. Use a product that contains chlorine bleach.  Keep all follow-up visits as told by your doctor. This is important. Contact a doctor  if:  Your symptoms do not get better with treatment.  Your symptoms get worse with treatment.  Your symptoms go away and then come back.  You have a fever.  You have new symptoms. Get help right away if:  You have more pain or tenderness in your belly (abdomen).  Your poop (stool) is mostly bloody.  Your poop looks dark black and tarry.  You cannot eat or drink without throwing up (vomiting).  You have signs of dehydration, such as: ? Dark pee, very little pee, or no pee. ? Cracked lips. ? Not making tears when you cry. ? Dry mouth. ? Sunken eyes. ? Feeling sleepy. ? Feeling weak. ? Feeling dizzy. This information is not intended to replace advice given to you by your health care provider. Make sure you discuss any questions you have with your health care provider. Document Released: 09/18/2009 Document Revised: 04/28/2016 Document Reviewed: 05/25/2015 Elsevier Interactive Patient Education  2017 [employer]  Near-Syncope Near-syncope is when you suddenly get weak or dizzy, or you feel like you might pass out (faint). During an episode of near-syncope, you may:  Feel dizzy or light-headed.  Feel sick to your stomach (nauseous).  See all white or all black.  Have cold, clammy skin.  If you passed out, get help right away.Call your local emergency services (911 in the [place] Do not drive yourself to the hospital. Follow these instructions at home: Pay attention to any changes in your symptoms. Take these actions to help  with your condition:  Have someone stay with you until you feel stable.  Do not drive, use machinery, or play sports until your doctor says it is okay.  Keep all follow-up visits as told by your doctor. This is important.  If you start to feel like you might pass out, lie down right away and raise (elevate) your feet above the level of your heart. Breathe deeply and steadily. Wait until all of the symptoms are gone.  Drink enough fluid to keep  your pee (urine) clear or pale yellow.  If you are taking blood pressure or heart medicine, get up slowly and spend many minutes getting ready to sit and then stand. This can help with dizziness.  Take over-the-counter and prescription medicines only as told by your doctor.  Get help right away if:  You have a very bad headache.  You have unusual pain in your chest, tummy, or back.  You are bleeding from your mouth or rectum.  You have black or tarry poop (stool).  You have a very fast or uneven heartbeat (palpitations).  You pass out one time or more than once.  You have jerky movements that you cannot control (seizure).  You are confused.  You have trouble walking.  You are very weak.  You have vision problems. These symptoms may be an emergency. Do not wait to see if the symptoms will go away. Get medical help right away. Call your local emergency services (911 in the [place] Do not drive yourself to the hospital. This information is not intended to replace advice given to you by your health care provider. Make sure you discuss any questions you have with your health care provider. Document Released: 05/09/2008 Document Revised: 04/28/2016 Document Reviewed: 08/05/2015 Elsevier Interactive Patient Education  2017 [employer].

## 2018-07-11 ENCOUNTER — Ambulatory Visit: Payer: Self-pay

## 2018-07-11 ENCOUNTER — Other Ambulatory Visit: Payer: Self-pay

## 2018-07-11 ENCOUNTER — Telehealth: Payer: Self-pay | Admitting: Family Medicine

## 2018-07-11 ENCOUNTER — Encounter (HOSPITAL_COMMUNITY): Payer: Self-pay | Admitting: Emergency Medicine

## 2018-07-11 ENCOUNTER — Inpatient Hospital Stay (HOSPITAL_COMMUNITY)
Admission: EM | Admit: 2018-07-11 | Discharge: 2018-07-17 | DRG: 371 | Disposition: A | Discharge status: Home Health | Payer: Medicare Other | Attending: Nephrology | Admitting: Nephrology

## 2018-07-11 DIAGNOSIS — E785 Hyperlipidemia, unspecified: Secondary | ICD-10-CM | POA: Diagnosis present

## 2018-07-11 DIAGNOSIS — Z9841 Cataract extraction status, right eye: Secondary | ICD-10-CM

## 2018-07-11 DIAGNOSIS — A09 Infectious gastroenteritis and colitis, unspecified: Secondary | ICD-10-CM | POA: Diagnosis not present

## 2018-07-11 DIAGNOSIS — I2510 Atherosclerotic heart disease of native coronary artery without angina pectoris: Secondary | ICD-10-CM | POA: Diagnosis present

## 2018-07-11 DIAGNOSIS — A0471 Enterocolitis due to Clostridium difficile, recurrent: Principal | ICD-10-CM | POA: Diagnosis present

## 2018-07-11 DIAGNOSIS — Z6822 Body mass index (BMI) 22.0-22.9, adult: Secondary | ICD-10-CM

## 2018-07-11 DIAGNOSIS — E1110 Type 2 diabetes mellitus with ketoacidosis without coma: Secondary | ICD-10-CM | POA: Diagnosis present

## 2018-07-11 DIAGNOSIS — R197 Diarrhea, unspecified: Secondary | ICD-10-CM | POA: Diagnosis present

## 2018-07-11 DIAGNOSIS — E119 Type 2 diabetes mellitus without complications: Secondary | ICD-10-CM

## 2018-07-11 DIAGNOSIS — E861 Hypovolemia: Secondary | ICD-10-CM | POA: Diagnosis present

## 2018-07-11 DIAGNOSIS — N179 Acute kidney failure, unspecified: Secondary | ICD-10-CM | POA: Diagnosis not present

## 2018-07-11 DIAGNOSIS — I110 Hypertensive heart disease with heart failure: Secondary | ICD-10-CM | POA: Diagnosis present

## 2018-07-11 DIAGNOSIS — I5022 Chronic systolic (congestive) heart failure: Secondary | ICD-10-CM | POA: Diagnosis present

## 2018-07-11 DIAGNOSIS — Z8249 Family history of ischemic heart disease and other diseases of the circulatory system: Secondary | ICD-10-CM

## 2018-07-11 DIAGNOSIS — E876 Hypokalemia: Secondary | ICD-10-CM | POA: Diagnosis present

## 2018-07-11 DIAGNOSIS — Z833 Family history of diabetes mellitus: Secondary | ICD-10-CM

## 2018-07-11 DIAGNOSIS — A0472 Enterocolitis due to Clostridium difficile, not specified as recurrent: Secondary | ICD-10-CM | POA: Diagnosis present

## 2018-07-11 DIAGNOSIS — Z7901 Long term (current) use of anticoagulants: Secondary | ICD-10-CM

## 2018-07-11 DIAGNOSIS — E440 Moderate protein-calorie malnutrition: Secondary | ICD-10-CM

## 2018-07-11 DIAGNOSIS — M170 Bilateral primary osteoarthritis of knee: Secondary | ICD-10-CM | POA: Diagnosis present

## 2018-07-11 DIAGNOSIS — Z7982 Long term (current) use of aspirin: Secondary | ICD-10-CM

## 2018-07-11 DIAGNOSIS — E860 Dehydration: Secondary | ICD-10-CM | POA: Diagnosis present

## 2018-07-11 DIAGNOSIS — I4891 Unspecified atrial fibrillation: Secondary | ICD-10-CM | POA: Diagnosis present

## 2018-07-11 DIAGNOSIS — Z9842 Cataract extraction status, left eye: Secondary | ICD-10-CM

## 2018-07-11 DIAGNOSIS — R64 Cachexia: Secondary | ICD-10-CM | POA: Diagnosis present

## 2018-07-11 DIAGNOSIS — Z90710 Acquired absence of both cervix and uterus: Secondary | ICD-10-CM

## 2018-07-11 DIAGNOSIS — Z79899 Other long term (current) drug therapy: Secondary | ICD-10-CM

## 2018-07-11 DIAGNOSIS — E871 Hypo-osmolality and hyponatremia: Secondary | ICD-10-CM | POA: Diagnosis present

## 2018-07-11 LAB — COMPREHENSIVE METABOLIC PANEL
ALK PHOS: 51 U/L (ref 38–126)
ALT: 17 U/L (ref 0–44)
ANION GAP: 20 — AB (ref 5–15)
AST: 20 U/L (ref 15–41)
Albumin: 3.1 g/dL — ABNORMAL LOW (ref 3.5–5.0)
BILIRUBIN TOTAL: 1.6 mg/dL — AB (ref 0.3–1.2)
BUN: 23 mg/dL (ref 8–23)
CALCIUM: 8.8 mg/dL — AB (ref 8.9–10.3)
CO2: 18 mmol/L — ABNORMAL LOW (ref 22–32)
Chloride: 95 mmol/L — ABNORMAL LOW (ref 98–111)
Creatinine, Ser: 1.54 mg/dL — ABNORMAL HIGH (ref 0.44–1.00)
GFR, EST AFRICAN AMERICAN: 35 mL/min — AB (ref >60)
GFR, EST NON AFRICAN AMERICAN: 30 mL/min — AB (ref >60)
Glucose, Bld: 147 mg/dL — ABNORMAL HIGH (ref 70–99)
Potassium: 3 mmol/L — ABNORMAL LOW (ref 3.5–5.1)
SODIUM: 133 mmol/L — AB (ref 135–145)
TOTAL PROTEIN: 7.7 g/dL (ref 6.5–8.1)

## 2018-07-11 LAB — I-STAT VENOUS BLOOD GAS, ED
Acid-base deficit: 4 mmol/L — ABNORMAL HIGH (ref 0.0–2.0)
Bicarbonate: 18 mmol/L — ABNORMAL LOW (ref 20.0–28.0)
O2 SAT: 33 %
PCO2 VEN: 24.1 mmHg — AB (ref 44.0–60.0)
PO2 VEN: 18 mmHg — AB (ref 32.0–45.0)
TCO2: 19 mmol/L — AB (ref 22–32)
pH, Ven: 7.482 — ABNORMAL HIGH (ref 7.250–7.430)

## 2018-07-11 LAB — BETA-HYDROXYBUTYRIC ACID: BETA-HYDROXYBUTYRIC ACID: 4.19 mmol/L — AB (ref 0.05–0.27)

## 2018-07-11 LAB — CBC
HCT: 38.3 % (ref 36.0–46.0)
HEMOGLOBIN: 12 g/dL (ref 12.0–15.0)
MCH: 27.8 pg (ref 26.0–34.0)
MCHC: 31.3 g/dL (ref 30.0–36.0)
MCV: 88.7 fL (ref 78.0–100.0)
Platelets: 168 10*3/uL (ref 150–400)
RBC: 4.32 MIL/uL (ref 3.87–5.11)
RDW: 15.5 % (ref 11.5–15.5)
WBC: 13.2 10*3/uL — ABNORMAL HIGH (ref 4.0–10.5)

## 2018-07-11 LAB — MAGNESIUM: Magnesium: 1.6 mg/dL — ABNORMAL LOW (ref 1.7–2.4)

## 2018-07-11 LAB — CBG MONITORING, ED
Glucose-Capillary: 138 mg/dL — ABNORMAL HIGH (ref 70–99)
Glucose-Capillary: 125 mg/dL — ABNORMAL HIGH (ref 70–99)

## 2018-07-11 LAB — TYPE AND SCREEN
ABO/RH(D): O POS
ANTIBODY SCREEN: NEGATIVE

## 2018-07-11 LAB — PROTIME-INR
INR: 1.47
PROTHROMBIN TIME: 17.7 s — AB (ref 11.4–15.2)

## 2018-07-11 LAB — LIPASE, BLOOD: Lipase: 25 U/L (ref 11–51)

## 2018-07-11 LAB — ABO/RH: ABO/RH(D): O POS

## 2018-07-11 LAB — POC OCCULT BLOOD, ED: FECAL OCCULT BLD: NEGATIVE

## 2018-07-11 LAB — PHOSPHORUS: Phosphorus: 2.8 mg/dL (ref 2.5–4.6)

## 2018-07-11 MED ORDER — DEXTROSE-NACL 5-0.45 % IV SOLN
INTRAVENOUS | Status: DC
  Administered 2018-07-11: 22:00:00 via INTRAVENOUS

## 2018-07-11 MED ORDER — MAGNESIUM SULFATE 2 GM/50ML IV SOLN
2.0000 g | Freq: Once | INTRAVENOUS | Status: AC
  Administered 2018-07-12: 2 g via INTRAVENOUS
  Filled 2018-07-11: qty 50

## 2018-07-11 MED ORDER — LACTATED RINGERS IV BOLUS
1000.0000 mL | Freq: Once | INTRAVENOUS | Status: AC
  Administered 2018-07-11: 1000 mL via INTRAVENOUS

## 2018-07-11 MED ORDER — SODIUM CHLORIDE 0.9 % IV SOLN
INTRAVENOUS | Status: DC
  Administered 2018-07-11: 0.8 [IU]/h via INTRAVENOUS
  Filled 2018-07-11: qty 1

## 2018-07-11 MED ORDER — POTASSIUM CHLORIDE 10 MEQ/100ML IV SOLN: 10.0000 meq | Freq: Once | INTRAVENOUS | Status: DC

## 2018-07-11 MED ORDER — POTASSIUM CHLORIDE 10 MEQ/100ML IV SOLN
10.0000 meq | INTRAVENOUS | Status: DC
  Administered 2018-07-11 (×2): 10 meq via INTRAVENOUS
  Filled 2018-07-11 (×2): qty 100

## 2018-07-11 MED ORDER — POTASSIUM CHLORIDE 10 MEQ/100ML IV SOLN
10.0000 meq | INTRAVENOUS | Status: DC
  Administered 2018-07-12 (×2): 10 meq via INTRAVENOUS
  Filled 2018-07-11 (×2): qty 100

## 2018-07-11 MED ORDER — POTASSIUM CHLORIDE CRYS ER 20 MEQ PO TBCR: 40.0000 meq | EXTENDED_RELEASE_TABLET | Freq: Once | ORAL | Status: DC

## 2018-07-11 NOTE — Telephone Encounter (Signed)
Copied from CRM [phone]. Topic: Quick Communication - See Telephone Encounter >> Jul 11, 2018 12:06 PM Lorrine KinMcGee, Takuya Lariccia B, NT wrote: CRM for notification. See Telephone encounter for: 07/11/18. Patient's son, [name], calling and states that he would like a call from Dr Salomon FickBanks or her nurse. Did not say what it was regarding. Please advise. CB#: [phone]

## 2018-07-11 NOTE — Telephone Encounter (Signed)
Copied from CRM [phone]. Topic: Quick Communication - See Telephone Encounter >> Jul 11, 2018  2:20 PM Waymon AmatoBurton, Donna F wrote: Pt son is calling  Stating that he feels that the C_diff is back because the stools are dark and green and he would like to have something called in for it   [facility] [address]   [phone] bes number

## 2018-07-11 NOTE — H&P (Addendum)
Chelsea Ward ZOX:[id number] DOB: 25-Feb-1933 DOA: 07/11/2018     PCP: Deeann Saint, MD   Outpatient Specialists: NONE  Patient arrived to ER on 07/11/18 at 1740  Patient coming from:   home Lives With family   Chief Complaint:  Chief Complaint  Patient presents with  . Weakness  . GI Bleeding    HPI: Chelsea Ward is a 84 y.o. female with medical history significant of recent admission for C. Difficile, atrial fibrillation, CHF, CAD, HLD , DM 2, HTN  Presented with dark stools starting today patient has been somewhat dizzy she have had at least 3 black stools today son called primary care provider and was told to present to emergency department. Family she's been more weak than usual has been having 6 out of 10 abdominal pain.  Patient is on Eliquis for history of atrial fibrillation On August 5 seen in the office by Dr. Salomon Fick had had recent C. Difficile enteritis completed two-week course of vancomycin by mouth continued to have some persistent diarrhea was encouraged to try probiotic and was retested for C. Difficile Was noted to be hypotensive down  To 92/68 patient is on metoprolol and Lasix. Metoprolol was decreased to 12.5 mg twice a day but Lasix continued Patient was encouraged to drink plenty of fluids  Of note she was admitted beginning of July for atrial fibrillation with RVR and C. Difficile diarrhea which was treated with vancomycin 125 mg 4 times a day for total of 2 weeks Prior to this she was admitted in June with similar presentation and at that time was also treated with vancomycin 4 times daily    Regarding pertinent Chronic problems:Last echogram in June 2019 showed EF 45%  While in ER: WBC up to 13 Cr up 1.22-> 1.56  The following Work up has been ordered so far:  Orders Placed This Encounter  Procedures  . C difficile quick scan w PCR reflex  . Comprehensive metabolic panel  . CBC  . Lipase, blood  . Urinalysis, Routine w reflex microscopic    . Magnesium  . Beta-hydroxybutyric acid  . Protime-INR  . Diet NPO time specified  . Orthostatic Vital signs  . Place X2 Large Bore IV's  . Initiate Carrier Fluid Protocol  . Cardiac monitoring  . Consult to hospitalist  . Enteric precautions (UV disinfection)  . Pulse oximetry, continuous  . Pulse oximetry, continuous  . POC occult blood, ED  . I-Stat Chem 8, ED  . CBG monitoring, ED  . I-Stat Venous Blood Gas, ED  . ED EKG  . EKG 12-Lead  . Type and screen [facility]  . ABO/Rh      Following Medications were ordered in ER: Medications  potassium chloride 10 mEq in 100 mL IVPB (10 mEq Intravenous New Bag/Given 07/11/18 2157)  dextrose 5 %-0.45 % sodium chloride infusion ( Intravenous New Bag/Given 07/11/18 2157)  insulin regular (NOVOLIN R,HUMULIN R) 100 Units in sodium chloride 0.9 % 100 mL (1 Units/mL) infusion (0.8 Units/hr Intravenous New Bag/Given 07/11/18 2158)  lactated ringers bolus 1,000 mL (1,000 mLs Intravenous New Bag/Given 07/11/18 2157)    Significant initial  Findings: Abnormal Labs Reviewed  COMPREHENSIVE METABOLIC PANEL - Abnormal; Notable for the following components:      Result Value   Sodium 133 (*)    Potassium 3.0 (*)    Chloride 95 (*)    CO2 18 (*)    Glucose, Bld 147 (*)    Creatinine,  Ser 1.54 (*)    Calcium 8.8 (*)    Albumin 3.1 (*)    Total Bilirubin 1.6 (*)    GFR calc non Af Amer 30 (*)    GFR calc Af Amer 35 (*)    Anion gap 20 (*)    All other components within normal limits  CBC - Abnormal; Notable for the following components:   WBC 13.2 (*)    All other components within normal limits  MAGNESIUM - Abnormal; Notable for the following components:   Magnesium 1.6 (*)    All other components within normal limits  BETA-HYDROXYBUTYRIC ACID - Abnormal; Notable for the following components:   Beta-Hydroxybutyric Acid 4.19 (*)    All other components within normal limits  PROTIME-INR - Abnormal; Notable for the  following components:   Prothrombin Time 17.7 (*)    All other components within normal limits  CBG MONITORING, ED - Abnormal; Notable for the following components:   Glucose-Capillary 138 (*)    All other components within normal limits  I-STAT VENOUS BLOOD GAS, ED - Abnormal; Notable for the following components:   pH, Ven 7.482 (*)    pCO2, Ven 24.1 (*)    pO2, Ven 18.0 (*)    Bicarbonate 18.0 (*)    TCO2 19 (*)    Acid-base deficit 4.0 (*)    All other components within normal limits     Na 138 K 3.0  Cr  Up from baseline see below Lab Results  Component Value Date   CREATININE 1.54 (H) 07/11/2018   CREATININE 1.22 (H) 06/25/2018   CREATININE 0.87 06/19/2018      HG/HCT   stable,      Component Value Date/Time   HGB 12.0 07/11/2018 1832   HCT 38.3 07/11/2018 1832     Troponin (Point of Care Test) No results for input(s): TROPIPOC in the last 72 hours.   BNP (last 3 results) Recent Labs    06/12/18 1903  BNP 189.5*    ProBNP (last 3 results) No results for input(s): PROBNP in the last 8760 hours.  Lactic Acid, Venous    Component Value Date/Time   LATICACIDVEN 1.71 06/12/2018 1911      UA  ordered   ECG:  Personally reviewed by me showing: HR :110 Rhythm: sinus tachycardia   no evidence of ischemic changes QTC 441       ED Triage Vitals  Enc Vitals Group     BP 07/11/18 1822 (!) 85/71     Pulse Rate 07/11/18 1822 83     Resp 07/11/18 1822 14     Temp 07/11/18 1822 99.1 F (37.3 C)     Temp Source 07/11/18 1822 Oral     SpO2 07/11/18 1822 100 %     Weight 07/11/18 1823 130 lb (59 kg)     Height 07/11/18 1823 5\' 4"  (1.626 m)     Head Circumference --      Peak Flow --      Pain Score 07/11/18 1823 6     Pain Loc --      Pain Edu? --      Excl. in GC? --   TMAX(24)@       Latest  Blood pressure 108/90, pulse 94, temperature 98.7 F (37.1 C), temperature source Oral, resp. rate (!) 23, height 5\' 4"  (1.626 m), weight 59 kg (130  lb), SpO2 100 %.    Hospitalist was called for admission for Euglycemic DKA in the  setting of JAnuvia use  Review of Systems:    Pertinent positives include: diarrhea black stool  abdominal pain, Constitutional:  No weight loss, night sweats, Fevers, chills, fatigue, weight loss  HEENT:  No headaches, Difficulty swallowing,Tooth/dental problems,Sore throat,  No sneezing, itching, ear ache, nasal congestion, post nasal drip,  Cardio-vascular:  No chest pain, Orthopnea, PND, anasarca, dizziness, palpitations.no Bilateral lower extremity swelling  GI:  No heartburn, indigestion, nausea, vomiting,  change in bowel habits, loss of appetite, melena, blood in stool, hematemesis Resp:  no shortness of breath at rest. No dyspnea on exertion, No excess mucus, no productive cough, No non-productive cough, No coughing up of blood.No change in color of mucus.No wheezing. Skin:  no rash or lesions. No jaundice GU:  no dysuria, change in color of urine, no urgency or frequency. No straining to urinate.  No flank pain.  Musculoskeletal:  No joint pain or no joint swelling. No decreased range of motion. No back pain.  Psych:  No change in mood or affect. No depression or anxiety. No memory loss.  Neuro: no localizing neurological complaints, no tingling, no weakness, no double vision, no gait abnormality, no slurred speech, no confusion  All systems reviewed and apart from HOPI all are negative  Past Medical History:   Past Medical History:  Diagnosis Date  . Arthritis    "arms, knees" (06/12/2018)  . Atrial fibrillation with RVR (HCC) 06/12/2018  . CHF (congestive heart failure) (HCC)   . Heart disease   . Hypercholesteremia   . Hyperlipidemia   . Hypertension   . Type II diabetes mellitus (HCC)       Past Surgical History:  Procedure Laterality Date  . CATARACT EXTRACTION, BILATERAL Bilateral   . COLON SURGERY     "my bowels lock"  . TUBAL LIGATION    . VAGINAL HYSTERECTOMY       Social History:  Ambulatory     walker      reports that she has never smoked. She has never used smokeless tobacco. She reports that she does not drink alcohol or use drugs.     Family History:   Family History  Problem Relation Age of Onset  . Diabetes Mellitus II Son   . Arthritis Mother   . Hearing loss Mother   . Arthritis Father   . Heart attack Father   . Arthritis Sister   . Heart attack Sister     Allergies: No Known Allergies   Prior to Admission medications   Medication Sig Start Date End Date Taking? Authorizing Provider  acetaminophen (TYLENOL) 325 MG tablet Take 2 tablets (650 mg total) by mouth every 8 (eight) hours. Patient taking differently: Take 650 mg by mouth every 8 (eight) hours as needed for moderate pain or fever.  06/21/18  Yes Emokpae, Courage, MD  apixaban (ELIQUIS) 2.5 MG TABS tablet Take 1 tablet (2.5 mg total) by mouth 2 (two) times daily. 06/21/18  Yes Emokpae, Courage, MD  aspirin EC 81 MG tablet Take 81 mg by mouth daily.   Yes [provider]  ezetimibe (ZETIA) 10 MG tablet Take 10 mg by mouth daily. 01/29/18  Yes [provider]  feeding supplement, GLUCERNA SHAKE, (GLUCERNA SHAKE) LIQD Take 237 mLs by mouth 2 (two) times daily between meals. 06/21/18  Yes Emokpae, Courage, MD  fexofenadine (ALLEGRA) 60 MG tablet Take 1 tablet (60 mg total) by mouth daily. 06/04/18  Yes Deeann SaintBanks, Shannon R, MD  fluticasone (FLONASE) 50 MCG/ACT nasal spray Place  1-2 sprays into both nostrils daily. 07/04/18  Yes [provider]  furosemide (LASIX) 20 MG tablet Take 1 tablet (20 mg total) by mouth daily. 05/23/18  Yes Danford, Earl Lites, MD  JANUVIA 100 MG tablet Take 100 mg by mouth daily. 03/02/18  Yes [provider]  metoprolol tartrate (LOPRESSOR) 25 MG tablet Take 0.5 tablets (12.5 mg total) by mouth 2 (two) times daily. 07/09/18  Yes Deeann Saint, MD  Omega-3 Fatty Acids (FISH OIL) 500 MG CAPS Take 1 capsule by mouth 2  (two) times daily.   Yes [provider]  ondansetron (ZOFRAN ODT) 4 MG disintegrating tablet Take 1 tablet (4 mg total) by mouth every 8 (eight) hours as needed for nausea or vomiting. 06/21/18  Yes Emokpae, Courage, MD  polyvinyl alcohol (LIQUIFILM TEARS) 1.4 % ophthalmic solution Place 1 drop into both eyes as needed for dry eyes. Patient taking differently: Place 1 drop into both eyes 2 (two) times daily.  06/21/18  Yes Shon Hale, MD  saccharomyces boulardii (FLORASTOR) 250 MG capsule Take 1 capsule (250 mg total) by mouth 2 (two) times daily. 06/21/18  Yes Emokpae, Courage, MD  simvastatin (ZOCOR) 40 MG tablet Take 40 mg by mouth daily.    [provider]   Physical Exam: Blood pressure 108/90, pulse 94, temperature 98.7 F (37.1 C), temperature source Oral, resp. rate (!) 23, height 5\' 4"  (1.626 m), weight 59 kg (130 lb), SpO2 100 %. 1. General:  in No Acute distress * Chronically ill *well *cachectic *toxic acutely ill -appearing 2. Psychological: Alert and   Oriented 3. Head/ENT:    Dry Mucous Membranes                          Head Non traumatic, neck supple                            Poor Dentition 4. SKIN:   decreased Skin turgor,  Skin clean Dry and intact no rash 5. Heart: Regular rate and rhythm no  Murmur, no Rub or gallop 6. Lungs: no wheezes or crackles   7. Abdomen: Soft, tender, Non distended   obese  bowel sounds present 8. Lower extremities: no clubbing, cyanosis, or  edema 9. Neurologically Grossly intact, moving all 4 extremities equally 10. MSK: Normal range of motion   LABS:     Recent Labs  Lab 07/11/18 1832  WBC 13.2*  HGB 12.0  HCT 38.3  MCV 88.7  PLT 168   Basic Metabolic Panel: Recent Labs  Lab 07/11/18 1832  NA 133*  K 3.0*  CL 95*  CO2 18*  GLUCOSE 147*  BUN 23  CREATININE 1.54*  CALCIUM 8.8*  MG 1.6*      Recent Labs  Lab 07/11/18 1832  AST 20  ALT 17  ALKPHOS 51  BILITOT 1.6*  PROT 7.7  ALBUMIN 3.1*     Recent Labs  Lab 07/11/18 1832  LIPASE 25   No results for input(s): AMMONIA in the last 168 hours.    HbA1C: No results for input(s): HGBA1C in the last 72 hours. CBG: Recent Labs  Lab 07/11/18 2150  GLUCAP 138*      Urine analysis:    Component Value Date/Time   COLORURINE YELLOW 06/09/2018 2051   APPEARANCEUR CLEAR 06/09/2018 2051   LABSPEC 1.015 06/09/2018 2051   PHURINE 5.0 06/09/2018 2051   GLUCOSEU NEGATIVE 06/09/2018  2051   HGBUR NEGATIVE 06/09/2018 2051   BILIRUBINUR NEGATIVE 06/09/2018 2051   KETONESUR 5 (A) 06/09/2018 2051   PROTEINUR NEGATIVE 06/09/2018 2051   NITRITE NEGATIVE 06/09/2018 2051   LEUKOCYTESUR NEGATIVE 06/09/2018 2051       Cultures:    Component Value Date/Time   SDES BLOOD RIGHT ARM 05/16/2018 0354   SPECREQUEST  05/16/2018 0354    BOTTLES DRAWN AEROBIC ONLY Blood Culture adequate volume   CULT  05/16/2018 0354    NO GROWTH 5 DAYS Performed at [facility], [address], [place], [place] [address]    REPTSTATUS 05/21/2018 FINAL 05/16/2018 0354     Radiological Exams on Admission: No results found.  Chart has been reviewed    Assessment/Plan  84 y.o. female with medical history significant of recent admission for C. Difficile, atrial fibrillation, CHF, CAD, HLD , DM 2, HTN Admitted for Euglycemic DKA in the setting of JAnuvia use  Present on Admission: . DKA (diabetic ketoacidoses) (HCC) - Euglycemic DKA in the setting of JAnuvia use Initiate insuline drip and IVF follow AG  . C. difficile colitis - persistent symptoms despite 2 courses of Van PO would benefit from ID consult , discussed with pharmacy for now restart  pO vanc given recurrent symptoms. 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 May also benefit from GI consult . Diarrhea - C.diff ordered by ER MD will follow up results. May need ID consult . HLD (hyperlipidemia) - stable continue home meds . AKI (acute kidney injury) (HCC) - rehydrate  and follow renal function . Diabetic ketoacidosis associated with type 2 - currently in DKA due to Januvia use    Elevated lactic acid - will rehydrate and recheck afebrile given recent c.diff will hold off on IV antibiotics non toxic appearing. Likely due to dehydration Black stools no change in HG will follow hem occult negative Follow CBC for tonight hold ASA and eliquis  Hx of CHF - avoid fluid overload, hold Lasix for tonight  HTN - restart metoprolol, currently normotensive   Abdominal pain given persistent abdominal pain will order CT abd to further evaluate Other plan as per orders.  DVT prophylaxis:  SCD     Code Status:  FULL CODE   as per patient  I had personally discussed CODE STATUS   Family Communication:   Family not at  Bedside    Disposition Plan:      To home once workup is complete and patient is stable                     Consults called: none  Admission status:    obs   Level of care        SDU        Hamid Brookens 07/11/2018, 12:24 AM    [facility]  Pager [phone]   after 2 AM please page floor coverage PA If 7AM-7PM, please contact the day team taking care of the patient  [email or web address]  Password [email or web address]

## 2018-07-11 NOTE — ED Triage Notes (Signed)
Pt's family reports weakness that started today. Pt baseline ambulatory and talkative, less so now, slow to respond. Pt family reports black stools and blood in stool. Pt takes Eliquis. Pt has been admitted for the same thing the last two months. Pt reports 6/10 lower abd pain. Pt A&Ox3.

## 2018-07-11 NOTE — ED Notes (Signed)
I-Stat Venous Blood Gas Abnormal results pO2 of 18 reported to Dr. Rhunette CroftNanavati.

## 2018-07-11 NOTE — Telephone Encounter (Signed)
[facility] for 2 more weeks.

## 2018-07-11 NOTE — ED Provider Notes (Signed)
[facility] EMERGENCY DEPARTMENT Provider Note   CSN: [id number] Arrival date & time: 07/11/18  1740     History   Chief Complaint Chief Complaint  Patient presents with  . Weakness  . GI Bleeding    HPI Layne Lebon is a 84 y.o. female is an 84 year old female with diabetes, hypertension, hyperlipidemia, and atrial fibrillation on Eliquis who presents due to feeling weak and having black stools this morning.  She is at her mental baseline per her son.  Her son lives with her and helps take care of her.  He noticed that she has been weak and fatigued over the past few days.  Today, he noticed that she had a bowel movement with black stools.  He says that she has recently been admitted for similar problems.  Earlier, he reports that she was having abdominal pain.  Now, she reports that her abdomen only mildly hurts her.  She is unable to further characterize her pain.  HPI  Past Medical History:  Diagnosis Date  . Arthritis    "arms, knees" (06/12/2018)  . Atrial fibrillation with RVR (HCC) 06/12/2018  . CHF (congestive heart failure) (HCC)   . Heart disease   . Hypercholesteremia   . Hyperlipidemia   . Hypertension   . Type II diabetes mellitus [facility])     Patient Active Problem List   Diagnosis Date Noted  . DKA (diabetic ketoacidoses) (HCC) 07/12/2018  . AKI (acute kidney injury) (HCC) 07/11/2018  . Diabetic ketoacidosis associated with type 2 diabetes mellitus (HCC) 07/11/2018  . Acute on chronic combined systolic and diastolic CHF (congestive heart failure) (HCC)   . Pressure injury of skin 06/13/2018  . Atrial fibrillation with RVR (HCC) 06/12/2018  . Seasonal allergies 06/04/2018  . Controlled type 2 diabetes mellitus without complication, without long-term current use of insulin (HCC) 06/04/2018  . Congestive heart failure (HCC) 06/04/2018  . C. difficile colitis 05/17/2018  . SIRS (systemic inflammatory response syndrome) (HCC) 05/16/2018  .  Diarrhea 05/16/2018  . Diabetes mellitus type 2 in nonobese (HCC) 05/16/2018  . HLD (hyperlipidemia) 05/16/2018    Past Surgical History:  Procedure Laterality Date  . CATARACT EXTRACTION, BILATERAL Bilateral   . COLON SURGERY     "my bowels lock"  . TUBAL LIGATION    . VAGINAL HYSTERECTOMY       OB History   None      Home Medications    Prior to Admission medications   Medication Sig Start Date End Date Taking? Authorizing Provider  acetaminophen (TYLENOL) 325 MG tablet Take 2 tablets (650 mg total) by mouth every 8 (eight) hours. Patient taking differently: Take 650 mg by mouth every 8 (eight) hours as needed for moderate pain or fever.  06/21/18  Yes Emokpae, Courage, MD  apixaban (ELIQUIS) 2.5 MG TABS tablet Take 1 tablet (2.5 mg total) by mouth 2 (two) times daily. 06/21/18  Yes Emokpae, Courage, MD  aspirin EC 81 MG tablet Take 81 mg by mouth daily.   Yes [provider]  ezetimibe (ZETIA) 10 MG tablet Take 10 mg by mouth daily. 01/29/18  Yes [provider]  feeding supplement, GLUCERNA SHAKE, (GLUCERNA SHAKE) LIQD Take 237 mLs by mouth 2 (two) times daily between meals. 06/21/18  Yes Emokpae, Courage, MD  fexofenadine (ALLEGRA) 60 MG tablet Take 1 tablet (60 mg total) by mouth daily. 06/04/18  Yes Deeann Saint, MD  fluticasone (FLONASE) 50 MCG/ACT nasal spray Place 1-2 sprays into both nostrils  daily. 07/04/18  Yes [provider]  furosemide (LASIX) 20 MG tablet Take 1 tablet (20 mg total) by mouth daily. 05/23/18  Yes Danford, Earl Lites, MD  JANUVIA 100 MG tablet Take 100 mg by mouth daily. 03/02/18  Yes [provider]  metoprolol tartrate (LOPRESSOR) 25 MG tablet Take 0.5 tablets (12.5 mg total) by mouth 2 (two) times daily. 07/09/18  Yes Deeann Saint, MD  Omega-3 Fatty Acids (FISH OIL) 500 MG CAPS Take 1 capsule by mouth 2 (two) times daily.   Yes [provider]  ondansetron (ZOFRAN ODT) 4 MG disintegrating tablet Take 1  tablet (4 mg total) by mouth every 8 (eight) hours as needed for nausea or vomiting. 06/21/18  Yes Emokpae, Courage, MD  polyvinyl alcohol (LIQUIFILM TEARS) 1.4 % ophthalmic solution Place 1 drop into both eyes as needed for dry eyes. Patient taking differently: Place 1 drop into both eyes 2 (two) times daily.  06/21/18  Yes Shon Hale, MD  saccharomyces boulardii (FLORASTOR) 250 MG capsule Take 1 capsule (250 mg total) by mouth 2 (two) times daily. 06/21/18  Yes Emokpae, Courage, MD  simvastatin (ZOCOR) 40 MG tablet Take 40 mg by mouth daily.    [provider]    Family History Family History  Problem Relation Age of Onset  . Diabetes Mellitus II Son   . Arthritis Mother   . Hearing loss Mother   . Arthritis Father   . Heart attack Father   . Arthritis Sister   . Heart attack Sister     Social History Social History   Tobacco Use  . Smoking status: Never Smoker  . Smokeless tobacco: Never Used  Substance Use Topics  . Alcohol use: Never    Frequency: Never  . Drug use: Never     Allergies   Patient has no known allergies.   Review of Systems Review of Systems Review of Systems   Constitutional  Negative for fever  Negative for chills  HENT  Negative for ear pain  Negative for sore throat  Negative for difficultly swallowing  Eyes  Negative for eye pain  Negative for visual disturbance  Respiratory  Negative for shortness of breath  Negative for cough  CV  Negative for chest pain  Negative for leg swelling  Abdomen  +for abdominal pain  +for nausea  Negative for vomiting  MSK  Negative for extremity pain  Negative for back pain  Skin  Negative for rash  Negative for wound  Neuro  Negative for syncope  Negative for difficultly speaking  Psych  Negative for confusion   The remainder of the ROS was reviewed and negative except as documented above.      Physical Exam Updated Vital Signs BP (!) 116/91   Pulse 82    Temp 98.7 F (37.1 C) (Oral)   Resp (!) 26   Ht 5\' 4"  (1.626 m)   Wt 59 kg (130 lb)   SpO2 100%   BMI 22.31 kg/m   Physical Exam Physical Exam Constitutional  Nursing notes reviewed  Vital signs reviewed  HEENT  No obvious trauma  Supple without meningismus, mass, or overt JVD  EOMI  No scleral icterus or injection  Respiratory  Effort normal  CTAB  No respiratory distress  CV  Normal rate  No obvious murmurs  No pitting edema  Abdomen  Soft  Mild periumbical pain  Non-distended  No peritonitis  MSK  Atraumatic  No obvious deformity  ROM appropriate  Skin  Warm  Dry  Pale appearing  +skin tenting  Neuro  Awake and alert  EOMI  Moving all extremities  Psychiatric  Mood and affect normal        ED Treatments / Results  Labs (all labs ordered are listed, but only abnormal results are displayed) Labs Reviewed  COMPREHENSIVE METABOLIC PANEL - Abnormal; Notable for the following components:      Result Value   Sodium 133 (*)    Potassium 3.0 (*)    Chloride 95 (*)    CO2 18 (*)    Glucose, Bld 147 (*)    Creatinine, Ser 1.54 (*)    Calcium 8.8 (*)    Albumin 3.1 (*)    Total Bilirubin 1.6 (*)    GFR calc non Af Amer 30 (*)    GFR calc Af Amer 35 (*)    Anion gap 20 (*)    All other components within normal limits  CBC - Abnormal; Notable for the following components:   WBC 13.2 (*)    All other components within normal limits  MAGNESIUM - Abnormal; Notable for the following components:   Magnesium 1.6 (*)    All other components within normal limits  BETA-HYDROXYBUTYRIC ACID - Abnormal; Notable for the following components:   Beta-Hydroxybutyric Acid 4.19 (*)    All other components within normal limits  PROTIME-INR - Abnormal; Notable for the following components:   Prothrombin Time 17.7 (*)    All other components within normal limits  CBG MONITORING, ED - Abnormal; Notable for the following components:    Glucose-Capillary 138 (*)    All other components within normal limits  I-STAT VENOUS BLOOD GAS, ED - Abnormal; Notable for the following components:   pH, Ven 7.482 (*)    pCO2, Ven 24.1 (*)    pO2, Ven 18.0 (*)    Bicarbonate 18.0 (*)    TCO2 19 (*)    Acid-base deficit 4.0 (*)    All other components within normal limits  I-STAT CHEM 8, ED - Abnormal; Notable for the following components:   Sodium 133 (*)    Potassium 3.0 (*)    Creatinine, Ser 1.10 (*)    Glucose, Bld 113 (*)    Calcium, Ion 0.95 (*)    TCO2 20 (*)    All other components within normal limits  CBG MONITORING, ED - Abnormal; Notable for the following components:   Glucose-Capillary 125 (*)    All other components within normal limits  I-STAT CG4 LACTIC ACID, ED - Abnormal; Notable for the following components:   Lactic Acid, Venous 3.25 (*)    All other components within normal limits  C DIFFICILE QUICK SCREEN W PCR REFLEX  GASTROINTESTINAL PANEL BY PCR, STOOL (REPLACES STOOL CULTURE)  LIPASE, BLOOD  PHOSPHORUS  URINALYSIS, ROUTINE W REFLEX MICROSCOPIC  BASIC METABOLIC PANEL  POC OCCULT BLOOD, ED  CBG MONITORING, ED  I-STAT CHEM 8, ED  TYPE AND SCREEN  ABO/RH    EKG EKG Interpretation  Date/Time:  Wednesday July 11 2018 18:25:57 EDT Ventricular Rate:  110 PR Interval:  154 QRS Duration: 88 QT Interval:  326 QTC Calculation: 441 R Axis:   -25 Text Interpretation:  Sinus tachycardia with frequent Premature ventricular complexes Left ventricular hypertrophy ST & T wave abnormality, consider inferior ischemia Abnormal ECG When compared with ECG of 06/13/2018, Premature ventricular complexes are now present Confirmed by Dione BoozeGlick, David ([id number]) on 07/11/2018 11:49:19 PM   Radiology No results found.  Procedures Procedures (including critical care time)  Medications Ordered in ED Medications  dextrose 5 %-0.45 % sodium chloride infusion ( Intravenous New Bag/Given 07/11/18 2157)  insulin regular  (NOVOLIN R,HUMULIN R) 100 Units in sodium chloride 0.9 % 100 mL (1 Units/mL) infusion (0.8 Units/hr Intravenous Rate/Dose Change 07/11/18 2349)  magnesium sulfate IVPB 2 g 50 mL (has no administration in time range)  potassium chloride 10 mEq in 100 mL IVPB (has no administration in time range)  lactated ringers bolus 1,000 mL (0 mLs Intravenous Stopped 07/11/18 2348)     Initial Impression / Assessment and Plan / ED Course  I have reviewed the triage vital signs and the nursing notes.  Pertinent labs & imaging results that were available during my care of the patient were reviewed by me and considered in my medical decision making (see chart for details).  Clinical Course as of Jul 13 35  Wed Jul 11, 2018  2015 [name] presents with fatigue and melena as per above.On exam, she is HDS.  She is neither tachycardic nor hypotensive.  She has mild periumbilical pain.  No peritonitis.  Rectal exam revealed no stool and no blood.  No anal fissures or hemorrhoids. Through record review, it appears as though she has been admitted twice in the past few months for C. difficile.  The son does not describe her having diarrhea.  He describes formed stool this morning.  However, he believes that she may be having diarrhea and he is not aware of it.   [NA]  2101 Her labs reveal mild leukocytosis to 13.2.  She is also mildly hyponatremic and hypokalemic at 133 and 3.0, respectively.  Her bicarb is low at 18.  Her glucose is mildly elevated at 147.  She has a mild AKI with a creatinine of 1.54.  Her anion gap is 20.She is on Januvia, which has been associated with euglycemic DKA.  We will initiate the ED DKA protocol.  Blood gas and BHB ordered.  Insulin, IVF, potassium replacement, and dextrose are ordered.   Lactic acid 3.25.  BHB 4. Heme occult negative.  I see no signs of acute infection on exam.  I am concerned for a possible GIB.  She was admitted for DKA, AKI, and for further eval of a possible  GIB.  She was admitted to the stepdown unit.  She remained stable while in the ED.  [NA]    Clinical Course User Index [NA] Talitha Givens, MD    Final Clinical Impressions(s) / ED Diagnoses   Final diagnoses:  Diabetic ketoacidosis without coma associated with type 2 diabetes mellitus (HCC)  AKI (acute kidney injury) [facility])    ED Discharge Orders    None       Talitha Givens, MD 07/12/18 0040    Derwood Kaplan, MD 07/15/18 1454    Derwood Kaplan, MD 07/15/18 1458

## 2018-07-11 NOTE — Telephone Encounter (Signed)
Patient's son [name] called in and says "my mom is passing black stools and I know it is C-Diff. I am trying to prevent her going to the emergency room. I called to see if she can be put on the medication she was on before for C-Diff. I have a container to collect her stool in, but every time she goes, she urinates too." I asked when did the black stool start, he says "today. Yesterday it was brown, but today it has been black and she's gone 3 times already today. She is having some abdominal pain and I gave her Tylenol earlier. She says her abdomen is still hurting." I asked about other symptoms besides abdominal pain, he says "just a little dizzy, but not that bad." According to protocol, go to ED, care advice given, patient's son verbalized understanding and says he will take her.   Reason for Disposition . Taking Coumadin (warfarin) or other strong blood thinner, or known bleeding disorder (e.g., thrombocytopenia)  Answer Assessment - Initial Assessment Questions 1. COLOR: "What color is it?" "Is that color in part or all of the stool?"     Black 2. ONSET: "When was the unusual color first noted?"     Today 3. CAUSE: "Have you eaten any food or taken any medicine of this color?" (See listing in BACKGROUND)     No 4. OTHER SYMPTOMS: "Do you have any other symptoms?" (e.g., diarrhea, jaundice, abdominal pain, fever).     Abdominal pain this morning, no diarrhea  Answer Assessment - Initial Assessment Questions 1. APPEARANCE of BLOOD: "What color is it?" "Is it passed separately, on the surface of the stool, or mixed in with the stool?"      Black stools 2. AMOUNT: "How much blood was passed?"      It's hard to measure, but I think a medium amount 3. FREQUENCY: "How many times has blood been passed with the stools?"      3 times today 4. ONSET: "When was the blood first seen in the stools?" (Days or weeks)      Today 5. DIARRHEA: "Is there also some diarrhea?" If so, ask: "How many diarrhea  stools were passed in past 24 hours?"      Not really, it's really loose 6. CONSTIPATION: "Do you have constipation?" If so, "How bad is it?"     No 7. RECURRENT SYMPTOMS: "Have you had blood in your stools before?" If so, ask: "When was the last time?" and "What happened that time?"      No 8. BLOOD THINNERS: "Do you take any blood thinners?" (e.g., Coumadin/warfarin, Pradaxa/dabigatran, aspirin)     Yes 9. OTHER SYMPTOMS: "Do you have any other symptoms?"  (e.g., abdominal pain, vomiting, dizziness, fever)     Yes, abdominal pain 10. PREGNANCY: "Is there any chance you are pregnant?" "When was your last menstrual period?"       No  Protocols used: RECTAL BLEEDING-A-AH, STOOLS - UNUSUAL COLOR-A-AH

## 2018-07-12 ENCOUNTER — Other Ambulatory Visit: Payer: Self-pay

## 2018-07-12 ENCOUNTER — Observation Stay (HOSPITAL_COMMUNITY): Payer: Medicare Other

## 2018-07-12 DIAGNOSIS — E876 Hypokalemia: Secondary | ICD-10-CM

## 2018-07-12 DIAGNOSIS — A0472 Enterocolitis due to Clostridium difficile, not specified as recurrent: Secondary | ICD-10-CM | POA: Diagnosis not present

## 2018-07-12 DIAGNOSIS — E1110 Type 2 diabetes mellitus with ketoacidosis without coma: Secondary | ICD-10-CM | POA: Diagnosis present

## 2018-07-12 DIAGNOSIS — N179 Acute kidney failure, unspecified: Secondary | ICD-10-CM | POA: Diagnosis not present

## 2018-07-12 DIAGNOSIS — E785 Hyperlipidemia, unspecified: Secondary | ICD-10-CM | POA: Diagnosis not present

## 2018-07-12 LAB — BASIC METABOLIC PANEL
ANION GAP: 10 (ref 5–15)
Anion gap: 14 (ref 5–15)
Anion gap: 17 — ABNORMAL HIGH (ref 5–15)
BUN: 11 mg/dL (ref 8–23)
BUN: 15 mg/dL (ref 8–23)
BUN: 20 mg/dL (ref 8–23)
CHLORIDE: 100 mmol/L (ref 98–111)
CHLORIDE: 102 mmol/L (ref 98–111)
CO2: 17 mmol/L — AB (ref 22–32)
CO2: 19 mmol/L — ABNORMAL LOW (ref 22–32)
CO2: 21 mmol/L — AB (ref 22–32)
CREATININE: 1.26 mg/dL — AB (ref 0.44–1.00)
Calcium: 7.6 mg/dL — ABNORMAL LOW (ref 8.9–10.3)
Calcium: 7.7 mg/dL — ABNORMAL LOW (ref 8.9–10.3)
Calcium: 8.3 mg/dL — ABNORMAL LOW (ref 8.9–10.3)
Chloride: 99 mmol/L (ref 98–111)
Creatinine, Ser: 1.04 mg/dL — ABNORMAL HIGH (ref 0.44–1.00)
Creatinine, Ser: 0.98 mg/dL (ref 0.44–1.00)
GFR calc Af Amer: 56 mL/min — ABNORMAL LOW (ref >60)
GFR calc non Af Amer: 38 mL/min — ABNORMAL LOW (ref >60)
GFR calc non Af Amer: 48 mL/min — ABNORMAL LOW (ref >60)
GFR calc non Af Amer: 52 mL/min — ABNORMAL LOW (ref >60)
GFR, EST AFRICAN AMERICAN: 44 mL/min — AB (ref >60)
Glucose, Bld: 113 mg/dL — ABNORMAL HIGH (ref 70–99)
Glucose, Bld: 134 mg/dL — ABNORMAL HIGH (ref 70–99)
Glucose, Bld: 137 mg/dL — ABNORMAL HIGH (ref 70–99)
POTASSIUM: 3 mmol/L — AB (ref 3.5–5.1)
Potassium: 2.8 mmol/L — ABNORMAL LOW (ref 3.5–5.1)
Potassium: 3 mmol/L — ABNORMAL LOW (ref 3.5–5.1)
Sodium: 132 mmol/L — ABNORMAL LOW (ref 135–145)
Sodium: 133 mmol/L — ABNORMAL LOW (ref 135–145)
Sodium: 134 mmol/L — ABNORMAL LOW (ref 135–145)

## 2018-07-12 LAB — I-STAT CHEM 8, ED
BUN: 12 mg/dL (ref 8–23)
BUN: 15 mg/dL (ref 8–23)
BUN: 22 mg/dL (ref 8–23)
BUN: 24 mg/dL — AB (ref 8–23)
BUN: 29 mg/dL — AB (ref 8–23)
CALCIUM ION: 0.99 mmol/L — AB (ref 1.15–1.40)
CALCIUM ION: 0.92 mmol/L — AB (ref 1.15–1.40)
CALCIUM ION: 0.95 mmol/L — AB (ref 1.15–1.40)
CHLORIDE: 96 mmol/L — AB (ref 98–111)
CHLORIDE: 100 mmol/L (ref 98–111)
CREATININE: 1 mg/dL (ref 0.44–1.00)
CREATININE: 1.1 mg/dL — AB (ref 0.44–1.00)
Calcium, Ion: 1.08 mmol/L — ABNORMAL LOW (ref 1.15–1.40)
Calcium, Ion: 0.9 mmol/L — ABNORMAL LOW (ref 1.15–1.40)
Chloride: 100 mmol/L (ref 98–111)
Chloride: 101 mmol/L (ref 98–111)
Chloride: 99 mmol/L (ref 98–111)
Creatinine, Ser: 1.5 mg/dL — ABNORMAL HIGH (ref 0.44–1.00)
Creatinine, Ser: 0.9 mg/dL (ref 0.44–1.00)
Creatinine, Ser: 0.8 mg/dL (ref 0.44–1.00)
GLUCOSE: 157 mg/dL — AB (ref 70–99)
GLUCOSE: 119 mg/dL — AB (ref 70–99)
GLUCOSE: 154 mg/dL — AB (ref 70–99)
Glucose, Bld: 113 mg/dL — ABNORMAL HIGH (ref 70–99)
Glucose, Bld: 139 mg/dL — ABNORMAL HIGH (ref 70–99)
HCT: 30 % — ABNORMAL LOW (ref 36.0–46.0)
HCT: 36 % (ref 36.0–46.0)
HCT: 38 % (ref 36.0–46.0)
HEMATOCRIT: 36 % (ref 36.0–46.0)
HEMATOCRIT: 31 % — AB (ref 36.0–46.0)
HEMOGLOBIN: 12.2 g/dL (ref 12.0–15.0)
HEMOGLOBIN: 12.2 g/dL (ref 12.0–15.0)
Hemoglobin: 12.9 g/dL (ref 12.0–15.0)
Hemoglobin: 10.5 g/dL — ABNORMAL LOW (ref 12.0–15.0)
Hemoglobin: 10.2 g/dL — ABNORMAL LOW (ref 12.0–15.0)
POTASSIUM: 2.9 mmol/L — AB (ref 3.5–5.1)
POTASSIUM: 2.5 mmol/L — AB (ref 3.5–5.1)
Potassium: 2.2 mmol/L — CL (ref 3.5–5.1)
Potassium: 3 mmol/L — ABNORMAL LOW (ref 3.5–5.1)
Potassium: 4 mmol/L (ref 3.5–5.1)
SODIUM: 131 mmol/L — AB (ref 135–145)
SODIUM: 133 mmol/L — AB (ref 135–145)
Sodium: 133 mmol/L — ABNORMAL LOW (ref 135–145)
Sodium: 131 mmol/L — ABNORMAL LOW (ref 135–145)
Sodium: 132 mmol/L — ABNORMAL LOW (ref 135–145)
TCO2: 19 mmol/L — AB (ref 22–32)
TCO2: 17 mmol/L — ABNORMAL LOW (ref 22–32)
TCO2: 21 mmol/L — ABNORMAL LOW (ref 22–32)
TCO2: 21 mmol/L — ABNORMAL LOW (ref 22–32)
TCO2: 20 mmol/L — AB (ref 22–32)

## 2018-07-12 LAB — CBG MONITORING, ED
GLUCOSE-CAPILLARY: 141 mg/dL — AB (ref 70–99)
GLUCOSE-CAPILLARY: 159 mg/dL — AB (ref 70–99)
GLUCOSE-CAPILLARY: 132 mg/dL — AB (ref 70–99)
GLUCOSE-CAPILLARY: 108 mg/dL — AB (ref 70–99)
Glucose-Capillary: 121 mg/dL — ABNORMAL HIGH (ref 70–99)
Glucose-Capillary: 139 mg/dL — ABNORMAL HIGH (ref 70–99)
Glucose-Capillary: 123 mg/dL — ABNORMAL HIGH (ref 70–99)
Glucose-Capillary: 143 mg/dL — ABNORMAL HIGH (ref 70–99)
Glucose-Capillary: 128 mg/dL — ABNORMAL HIGH (ref 70–99)

## 2018-07-12 LAB — I-STAT CG4 LACTIC ACID, ED
LACTIC ACID, VENOUS: 2.95 mmol/L — AB (ref 0.5–1.9)
LACTIC ACID, VENOUS: 3.25 mmol/L — AB (ref 0.5–1.9)

## 2018-07-12 LAB — GASTROINTESTINAL PANEL BY PCR, STOOL (REPLACES STOOL CULTURE)
Adenovirus F40/41: NOT DETECTED
Astrovirus: NOT DETECTED
CAMPYLOBACTER SPECIES: NOT DETECTED
CRYPTOSPORIDIUM: NOT DETECTED
CYCLOSPORA CAYETANENSIS: NOT DETECTED
ENTEROTOXIGENIC E COLI (ETEC): NOT DETECTED
Entamoeba histolytica: NOT DETECTED
Enteroaggregative E coli (EAEC): NOT DETECTED
Enteropathogenic E coli (EPEC): NOT DETECTED
Giardia lamblia: NOT DETECTED
Norovirus GI/GII: NOT DETECTED
PLESIMONAS SHIGELLOIDES: NOT DETECTED
ROTAVIRUS A: NOT DETECTED
SAPOVIRUS (I, II, IV, AND V): NOT DETECTED
SHIGA LIKE TOXIN PRODUCING E COLI (STEC): NOT DETECTED
Salmonella species: NOT DETECTED
Shigella/Enteroinvasive E coli (EIEC): NOT DETECTED
VIBRIO SPECIES: NOT DETECTED
Vibrio cholerae: NOT DETECTED
Yersinia enterocolitica: NOT DETECTED

## 2018-07-12 LAB — URINALYSIS, ROUTINE W REFLEX MICROSCOPIC
Bilirubin Urine: NEGATIVE
Glucose, UA: NEGATIVE mg/dL
KETONES UR: NEGATIVE mg/dL
Nitrite: NEGATIVE
PH: 7 (ref 5.0–8.0)
Protein, ur: NEGATIVE mg/dL
Specific Gravity, Urine: 1.006 (ref 1.005–1.030)

## 2018-07-12 LAB — LACTIC ACID, PLASMA
LACTIC ACID, VENOUS: 1.1 mmol/L (ref 0.5–1.9)
Lactic Acid, Venous: 1.8 mmol/L (ref 0.5–1.9)

## 2018-07-12 LAB — C DIFFICILE QUICK SCREEN W PCR REFLEX
C DIFFICILE (CDIFF) INTERP: DETECTED
C DIFFICILE (CDIFF) TOXIN: POSITIVE — AB
C DIFFICLE (CDIFF) ANTIGEN: POSITIVE — AB

## 2018-07-12 LAB — GLUCOSE, CAPILLARY
Glucose-Capillary: 186 mg/dL — ABNORMAL HIGH (ref 70–99)
Glucose-Capillary: 172 mg/dL — ABNORMAL HIGH (ref 70–99)
Glucose-Capillary: 81 mg/dL (ref 70–99)

## 2018-07-12 LAB — PROCALCITONIN: PROCALCITONIN: 0.3 ng/mL

## 2018-07-12 LAB — MAGNESIUM: MAGNESIUM: 1.8 mg/dL (ref 1.7–2.4)

## 2018-07-12 MED ORDER — EZETIMIBE 10 MG PO TABS
10.0000 mg | ORAL_TABLET | Freq: Every day | ORAL | Status: DC
  Administered 2018-07-12 – 2018-07-17 (×6): 10 mg via ORAL
  Filled 2018-07-12 (×6): qty 1

## 2018-07-12 MED ORDER — INSULIN GLARGINE 100 UNIT/ML ~~LOC~~ SOLN
10.0000 [IU] | SUBCUTANEOUS | Status: DC
  Administered 2018-07-12 – 2018-07-13 (×2): 10 [IU] via SUBCUTANEOUS
  Filled 2018-07-12 (×3): qty 0.1

## 2018-07-12 MED ORDER — HYOSCYAMINE SULFATE 0.125 MG SL SUBL
0.2500 mg | SUBLINGUAL_TABLET | Freq: Once | SUBLINGUAL | Status: AC
  Administered 2018-07-12: 0.25 mg via SUBLINGUAL
  Filled 2018-07-12: qty 2

## 2018-07-12 MED ORDER — SODIUM CHLORIDE 0.9 % IV SOLN
INTRAVENOUS | Status: DC
  Filled 2018-07-12: qty 1

## 2018-07-12 MED ORDER — SODIUM CHLORIDE 0.9 % IV SOLN
INTRAVENOUS | Status: DC
  Administered 2018-07-12: 10:00:00 via INTRAVENOUS

## 2018-07-12 MED ORDER — POTASSIUM CHLORIDE CRYS ER 20 MEQ PO TBCR
40.0000 meq | EXTENDED_RELEASE_TABLET | Freq: Four times a day (QID) | ORAL | Status: AC
  Administered 2018-07-12 (×2): 40 meq via ORAL
  Filled 2018-07-12 (×2): qty 2

## 2018-07-12 MED ORDER — SIMVASTATIN 40 MG PO TABS
40.0000 mg | ORAL_TABLET | Freq: Every day | ORAL | Status: DC
  Administered 2018-07-12 – 2018-07-17 (×6): 40 mg via ORAL
  Filled 2018-07-12 (×6): qty 1

## 2018-07-12 MED ORDER — ENSURE ENLIVE PO LIQD
237.0000 mL | Freq: Two times a day (BID) | ORAL | Status: DC
  Administered 2018-07-13: 237 mL via ORAL

## 2018-07-12 MED ORDER — METOPROLOL TARTRATE 12.5 MG HALF TABLET
12.5000 mg | ORAL_TABLET | Freq: Two times a day (BID) | ORAL | Status: DC
  Administered 2018-07-13 – 2018-07-17 (×9): 12.5 mg via ORAL
  Filled 2018-07-12 (×9): qty 1

## 2018-07-12 MED ORDER — SODIUM CHLORIDE 0.9 % IV SOLN
INTRAVENOUS | Status: DC
  Administered 2018-07-12 – 2018-07-13 (×3): via INTRAVENOUS

## 2018-07-12 MED ORDER — METOPROLOL TARTRATE 12.5 MG HALF TABLET
12.5000 mg | ORAL_TABLET | Freq: Two times a day (BID) | ORAL | Status: DC
  Filled 2018-07-12: qty 1

## 2018-07-12 MED ORDER — POTASSIUM CHLORIDE 10 MEQ/100ML IV SOLN
10.0000 meq | INTRAVENOUS | Status: AC
  Administered 2018-07-12 (×2): 10 meq via INTRAVENOUS
  Filled 2018-07-12 (×2): qty 100

## 2018-07-12 MED ORDER — IOPAMIDOL (ISOVUE-300) INJECTION 61%
INTRAVENOUS | Status: AC
  Administered 2018-07-12: 01:00:00
  Filled 2018-07-12: qty 30

## 2018-07-12 MED ORDER — APIXABAN 2.5 MG PO TABS
2.5000 mg | ORAL_TABLET | Freq: Two times a day (BID) | ORAL | Status: DC
  Administered 2018-07-12 – 2018-07-17 (×11): 2.5 mg via ORAL
  Filled 2018-07-12 (×11): qty 1

## 2018-07-12 MED ORDER — POLYVINYL ALCOHOL 1.4 % OP SOLN
2.0000 [drp] | OPHTHALMIC | Status: DC | PRN
  Administered 2018-07-13 – 2018-07-16 (×5): 2 [drp] via OPHTHALMIC
  Filled 2018-07-12: qty 15

## 2018-07-12 MED ORDER — SODIUM CHLORIDE 0.9 % IV SOLN: INTRAVENOUS | Status: DC

## 2018-07-12 MED ORDER — LORATADINE 10 MG PO TABS
10.0000 mg | ORAL_TABLET | Freq: Every day | ORAL | Status: DC
  Administered 2018-07-12 – 2018-07-17 (×6): 10 mg via ORAL
  Filled 2018-07-12 (×6): qty 1

## 2018-07-12 MED ORDER — VANCOMYCIN 50 MG/ML ORAL SOLUTION
125.0000 mg | Freq: Four times a day (QID) | ORAL | Status: DC
  Administered 2018-07-12 – 2018-07-15 (×13): 125 mg via ORAL
  Filled 2018-07-12 (×17): qty 2.5

## 2018-07-12 MED ORDER — INSULIN ASPART 100 UNIT/ML ~~LOC~~ SOLN
0.0000 [IU] | Freq: Three times a day (TID) | SUBCUTANEOUS | Status: DC
  Administered 2018-07-12: 3 [IU] via SUBCUTANEOUS
  Administered 2018-07-13 (×2): 2 [IU] via SUBCUTANEOUS
  Administered 2018-07-14 – 2018-07-16 (×2): 3 [IU] via SUBCUTANEOUS
  Administered 2018-07-17 (×2): 2 [IU] via SUBCUTANEOUS

## 2018-07-12 MED ORDER — POTASSIUM CHLORIDE CRYS ER 20 MEQ PO TBCR
40.0000 meq | EXTENDED_RELEASE_TABLET | ORAL | Status: AC
  Administered 2018-07-12 (×2): 40 meq via ORAL
  Filled 2018-07-12 (×2): qty 2

## 2018-07-12 MED ORDER — DEXTROSE-NACL 5-0.45 % IV SOLN: INTRAVENOUS | Status: DC

## 2018-07-12 NOTE — ED Notes (Signed)
flexi-seal placed 

## 2018-07-12 NOTE — Telephone Encounter (Signed)
Please advise 

## 2018-07-12 NOTE — Progress Notes (Signed)
    Inpatient Diabetes Program Recommendations  AACE/ADA: New Consensus Statement on Inpatient Glycemic Control (2015)  Target Ranges:  Prepandial:   less than 140 mg/dL      Peak postprandial:   less than 180 mg/dL (1-2 hours)      Critically ill patients:  140 - 180 mg/dL   Lab Results  Component Value Date   GLUCAP 143 (H) 07/12/2018   HGBA1C 7.1 (H) 06/13/2018    Review of Glycemic Control  Diabetes history: Type 2 Outpatient Diabetes medications: Januvia Current orders for Inpatient glycemic control: IV insulin drip  Inpatient Diabetes Program Recommendations:     Received diabetes coordinator consult.  After looking at labs and blood sugars, recommend discontinuing IV insulin drip and starting SQ insulin. Spoke with Dr. Barnie DelG. Krishnan on the phone. New insulin orders have been written: Lantus 10 units daily, Novolog 0-15 units TID.  Will continue to monitor blood sugars while in the hospital.   Smith MinceKendra Daouda Lonzo RN BSN CDE Diabetes Coordinator Pager: [phone]  8am-5pm

## 2018-07-12 NOTE — Evaluation (Signed)
Physical Therapy Evaluation Patient Details Name: Chelsea Ward MRN: [id number] DOB: 22-Aug-1933 Today's Date: 07/12/2018   History of Present Illness  Patient is an 84 y/o female presenting to the ED on 07/11/18 with dizziness and abnormal stool. Noted positive for C.Diff. PMH significant for C. Difficile, atrial fibrillation, CHF, CAD, HLD , DM 2, HTN.    Clinical Impression  Patient admitted with the above listed diagnosis. Patient reports that prior to admission, she lived with her son who assisted with ADLs/IADLs, as well as with limited mobility with RW. Limited eval today per order of only bathroom privileges. Requires up to Min A for sit to stand and standing balance for overall stability. Will currently recommend SNF for further therapies to regain safe and independent functional mobility prior to return home, but will continue to assess at upcoming visits. PT to continue to follow acutely to maximize strength, balance, functional mobility and safety.     Follow Up Recommendations SNF    Equipment Recommendations  None recommended by PT    Recommendations for Other Services OT consult     Precautions / Restrictions Precautions Precautions: Fall Restrictions Weight Bearing Restrictions: No      Mobility  Bed Mobility Overal bed mobility: Needs Assistance Bed Mobility: Rolling;Supine to Sit Rolling: Min guard   Supine to sit: Min guard     General bed mobility comments: min guard for safety and for line/tube management  Transfers Overall transfer level: Needs assistance Equipment used: 1 person hand held assist Transfers: Sit to/from Stand Sit to Stand: Min assist         General transfer comment: Min A with patient unsteady throughout  Ambulation/Gait             General Gait Details: deferred  Stairs            Wheelchair Mobility    Modified Rankin (Stroke Patients Only)       Balance Overall balance assessment: Needs  assistance Sitting-balance support: No upper extremity supported;Feet supported Sitting balance-Leahy Scale: Fair     Standing balance support: Single extremity supported Standing balance-Leahy Scale: Poor                               Pertinent Vitals/Pain Pain Assessment: No/denies pain    Home Living Family/patient expects to be discharged to:: Private residence Living Arrangements: Children(son) Available Help at Discharge: Family Type of Home: House Home Access: Stairs to enter Entrance Stairs-Rails: Doctor, general practice of Steps: 6 Home Layout: One level Home Equipment: Environmental consultant - 4 wheels;Cane - single point;Grab bars - tub/shower;Grab bars - toilet;Bedside commode;Shower seat Additional Comments: Pt lives with son.  Per pt report, son is with her most of the day    Prior Function Level of Independence: Independent with assistive device(s);Needs assistance   Gait / Transfers Assistance Needed: walks with walker  ADL's / Homemaking Assistance Needed: reports son helps with bathing and dressing        Hand Dominance        Extremity/Trunk Assessment   Upper Extremity Assessment Upper Extremity Assessment: Defer to OT evaluation    Lower Extremity Assessment Lower Extremity Assessment: Generalized weakness    Cervical / Trunk Assessment Cervical / Trunk Assessment: Normal  Communication   Communication: HOH  Cognition Arousal/Alertness: Awake/alert Behavior During Therapy: WFL for tasks assessed/performed;Flat affect Overall Cognitive Status: No family/caregiver present to determine baseline cognitive functioning  General Comments: slow to repsond to questions vs fatigue      General Comments General comments (skin integrity, edema, etc.): limited Eval per order set of bedrest with bathroom privileges    Exercises     Assessment/Plan    PT Assessment Patient needs continued PT  services  PT Problem List Decreased activity tolerance;Decreased balance;Decreased mobility;Decreased knowledge of use of DME;Decreased safety awareness;Decreased knowledge of precautions;Decreased strength       PT Treatment Interventions Gait training;DME instruction;Functional mobility training;Stair training;Therapeutic activities;Therapeutic exercise;Balance training;Neuromuscular re-education;Patient/family education    PT Goals (Current goals can be found in the Care Plan section)  Acute Rehab PT Goals Patient Stated Goal: return home PT Goal Formulation: With patient Time For Goal Achievement: 07/26/18 Potential to Achieve Goals: Good    Frequency Min 3X/week   Barriers to discharge        Co-evaluation               AM-PAC PT "6 Clicks" Daily Activity  Outcome Measure Difficulty turning over in bed (including adjusting bedclothes, sheets and blankets)?: A Lot Difficulty moving from lying on back to sitting on the side of the bed? : Unable Difficulty sitting down on and standing up from a chair with arms (e.g., wheelchair, bedside commode, etc,.)?: Unable Help needed moving to and from a bed to chair (including a wheelchair)?: A Little Help needed walking in hospital room?: A Little Help needed climbing 3-5 steps with a railing? : A Lot 6 Click Score: 12    End of Session   Activity Tolerance: Patient tolerated treatment well Patient left: in bed;with call bell/phone within reach;with bed alarm set Nurse Communication: Mobility status PT Visit Diagnosis: Unsteadiness on feet (R26.81);Other abnormalities of gait and mobility (R26.89);Muscle weakness (generalized) (M62.81)    Time: [id number] PT Time Calculation (min) (ACUTE ONLY): 16 min   Charges:   PT Evaluation $PT Eval Moderate Complexity: 1 Mod          Kipp LaurenceStephanie R Aaron, PT, DPT 07/12/18 4:00 PM Pager: [phone]

## 2018-07-12 NOTE — Telephone Encounter (Signed)
Pt is being treated in the ED. Nothing further needed.

## 2018-07-12 NOTE — Telephone Encounter (Signed)
I confirmed patient did arrive in the ER yesterday 07/11/2018.

## 2018-07-12 NOTE — Telephone Encounter (Signed)
Pt/son was to bring stool sample back to clinic, has yet to do so.  Also it appears pt is at the ED now.

## 2018-07-12 NOTE — ED Notes (Signed)
I-Stat Lactic Acid results of 3.25 reported to Dr. Drue DunAshburn.

## 2018-07-12 NOTE — ED Notes (Signed)
Pt on bedpan having liquid stool

## 2018-07-12 NOTE — Progress Notes (Signed)
[facility] PROGRESS NOTE  Chelsea RabonOdessa Ward ONG:[id number] DOB: September 24, 1933 DOA: 07/11/2018  PCP: Deeann SaintBanks, Shannon R, MD  Brief History/Interval Summary: 84 year old African-American female with a past medical history significant for recent hospitalization for C. difficile colitis, atrial fibrillation, chronic systolic CHF, coronary artery disease, diabetes mellitus type 2, essential hypertension presented with dark stools at home.  Also generalized weakness.  Patient recently completed a 2-week course of oral vancomycin.  She tells me that her diarrhea had resolved.  Then she started again having loose stools few days ago.  Seen by her PCPs office, underwent some testing and medications were adjusted.  She presented to the emergency department due to persistent symptoms.  Patient was noted to have acute renal failure with hypokalemia, some concern for DKA due to increased anion gap.  Stool was again positive for C. difficile.  She was started on an insulin infusion and was hospitalized.  Reason for Visit: Recurrent C. difficile colitis  Consultants: Infectious disease  Procedures: None  Antibiotics: Oral vancomycin  Subjective/Interval History: Patient states that she is feeling slightly better.  Continues to have generalized weakness.  Denies any abdominal discomfort.  Continues to have loose stool.  No vomiting this morning.  ROS: Denies any headaches  Objective:  Vital Signs  Vitals:   07/12/18 0500 07/12/18 0600 07/12/18 0700 07/12/18 1014  BP: 110/65 113/79 (!) 115/55 106/75  Pulse: (!) 118 71 61 85  Resp: (!) 28 (!) 21 15 18   Temp:    98.1 F (36.7 C)  TempSrc:    Oral  SpO2: 99% 98% 98% 99%  Weight:      Height:        Intake/Output Summary (Last 24 hours) at 07/12/2018 1055 Last data filed at 07/12/2018 1006 Gross per 24 hour  Intake 1500 ml  Output -  Net 1500 ml   Filed Weights   07/11/18 1823  Weight: 59 kg    General appearance: alert, cooperative,  appears stated age, fatigued and no distress Head: Normocephalic, without obvious abnormality, atraumatic Resp: clear to auscultation bilaterally Cardio: regular rate and rhythm, S1, S2 normal, no murmur, click, rub or gallop GI: Abdomen is soft.  Mildly tender diffusely without any rebound rigidity or guarding.  No masses organomegaly. Extremities: extremities normal, atraumatic, no cyanosis or edema Pulses: 2+ and symmetric Neurologic: Awake alert.  No obvious focal neurological deficits.  Lab Results:  Data Reviewed: I have personally reviewed following labs and imaging studies  CBC: Recent Labs  Lab 07/11/18 1832 07/11/18 2145 07/12/18 0021 07/12/18 0312 07/12/18 0457 07/12/18 0808  WBC 13.2*  --   --   --   --   --   HGB 12.0 12.9 12.2 12.2 10.5* 10.2*  HCT 38.3 38.0 36.0 36.0 31.0* 30.0*  MCV 88.7  --   --   --   --   --   PLT 168  --   --   --   --   --     Basic Metabolic Panel: Recent Labs  Lab 07/11/18 1832 07/11/18 2137  07/12/18 0011 07/12/18 0021 07/12/18 0312 07/12/18 0450 07/12/18 0457 07/12/18 0808  NA 133*  --    < > 134* 133* 131* 132* 131* 132*  K 3.0*  --    < > 3.0* 3.0* 4.0 2.8* 2.5* 2.2*  CL 95*  --    < > 100 100 101 99 100 99  CO2 18*  --   --  17*  --   --  19*  --   --   GLUCOSE 147*  --    < > 113* 113* 119* 134* 139* 154*  BUN 23  --    < > 20 22 24* 15 15 12   CREATININE 1.54*  --    < > 1.26* 1.10* 1.00 1.04* 0.90 0.80  CALCIUM 8.8*  --   --  8.3*  --   --  7.6*  --   --   MG 1.6*  --   --   --   --   --   --   --   --   PHOS  --  2.8  --   --   --   --   --   --   --    < > = values in this interval not displayed.    GFR: Estimated Creatinine Clearance: 46 mL/min (by C-G formula based on SCr of 0.8 mg/dL).  Liver Function Tests: Recent Labs  Lab 07/11/18 1832  AST 20  ALT 17  ALKPHOS 51  BILITOT 1.6*  PROT 7.7  ALBUMIN 3.1*    Recent Labs  Lab 07/11/18 1832  LIPASE 25   Coagulation Profile: Recent Labs  Lab  07/11/18 2137  INR 1.47    CBG: Recent Labs  Lab 07/12/18 0452 07/12/18 0553 07/12/18 0706 07/12/18 0922 07/12/18 1036  GLUCAP 123* 141* 159* 143* 128*     Recent Results (from the past 240 hour(s))  C difficile quick scan w PCR reflex     Status: Abnormal   Collection Time: 07/12/18  4:58 AM  Result Value Ref Range Status   C Diff antigen POSITIVE (A) NEGATIVE Final   C Diff toxin POSITIVE (A) NEGATIVE Final   C Diff interpretation Toxin producing C. difficile detected.  Final    Comment: CRITICAL RESULT CALLED TO, READ BACK BY AND VERIFIED WITHDeniece Ree RN 1610 07/12/18 Performed at [facility], [address], [place], [place] [address]       Radiology Studies: Ct Abdomen Pelvis Wo Contrast  Result Date: 07/12/2018 CLINICAL DATA:  Acute onset of generalized weakness. Black stools and blood in stool. EXAM: CT ABDOMEN AND PELVIS WITHOUT CONTRAST TECHNIQUE: Multidetector CT imaging of the abdomen and pelvis was performed following the standard protocol without IV contrast. COMPARISON:  CT of the abdomen and pelvis from 05/16/2018 FINDINGS: Lower chest: Mild bibasilar atelectasis or scarring is noted, with minimal honeycombing at the right lung base. Calcification is noted at the aortic valve. Hepatobiliary: The liver is unremarkable in appearance. The gallbladder is unremarkable in appearance. The common bile duct remains normal in caliber. Pancreas: The pancreas is within normal limits. Spleen: The spleen is unremarkable in appearance. Adrenals/Urinary Tract: The adrenal glands are unremarkable in appearance. The kidneys are within normal limits. There is no evidence of hydronephrosis. No renal or ureteral stones are identified. Mild nonspecific left-sided perinephric stranding is noted. Stomach/Bowel: The stomach is unremarkable in appearance. The small bowel is within normal limits. The appendix is normal in caliber, without evidence of appendicitis. Mild wall thickening  is noted along the ascending colon, and more diffuse wall thickening is seen along the descending and sigmoid colon, concerning for infectious or inflammatory colitis. Trace associated free fluid is noted. Vascular/Lymphatic: Scattered calcification is seen along the abdominal aorta and its branches. The abdominal aorta is otherwise grossly unremarkable. The inferior vena cava is grossly unremarkable. No retroperitoneal lymphadenopathy is seen. No pelvic sidewall lymphadenopathy is identified. Reproductive: The  bladder is mildly distended and grossly unremarkable. The patient is status post hysterectomy. No suspicious adnexal masses are seen. Other: No additional soft tissue abnormalities are seen. Musculoskeletal: No acute osseous abnormalities are identified. Facet disease is noted along the lower lumbar spine. The visualized musculature is unremarkable in appearance. IMPRESSION: 1. Mild wall thickening along the ascending colon, and more diffuse wall thickening along the descending and sigmoid colon, concerning for infectious or inflammatory colitis. Trace associated free fluid noted. 2. Mild bibasilar atelectasis or scarring, with minimal honeycombing at the right lung base. Aortic Atherosclerosis (ICD10-I70.0). Electronically Signed   By: Roanna Raider M.D.   On: 07/12/2018 04:28     Medications:  Scheduled: . apixaban  2.5 mg Oral BID  . ezetimibe  10 mg Oral Daily  . insulin aspart  0-15 Units Subcutaneous TID WC  . insulin glargine  10 Units Subcutaneous Q24H  . loratadine  10 mg Oral Daily  . metoprolol tartrate  12.5 mg Oral BID  . potassium chloride  40 mEq Oral Q4H  . simvastatin  40 mg Oral Daily  . vancomycin  125 mg Oral QID   Continuous: . sodium chloride    . dextrose 5 % and 0.45% NaCl 100 mL/hr at 07/11/18 2157  . insulin (NOVOLIN-R) infusion 0.8 Units/hr (07/12/18 0813)  . potassium chloride 10 mEq (07/12/18 1008)   PRN:  Assessment/Plan:    Possible diabetic  ketoacidosis No ketones noted in the urine.  Her beta hydroxy butyrate acid was elevated.  Anion gap was noted to be elevated.  Anion gap has closed.  Her bicarbonate levels have improved.  She will be transitioned to Lantus insulin.  HbA1c is pending.  Recurrent C. difficile colitis Patient had her first episode of C. difficile in June.  She has recurrence of her C. difficile in July.  She completed 2 different courses of oral vancomycin for 2 weeks duration.  She appears to be having another recurrence considering her diarrhea.  She will likely need a prolonged course of oral vancomycin.  We will discuss with infectious disease.  CT scan shows evidence for mild colitis.  Abdomen is benign to examination.  Patient's family mention dark stool however her heme testing was negative.  Hemoglobin is stable.  Does not appear to be a GI bleed at this time.  Acute kidney injury with severe hypokalemia Renal function has improved with hydration.  Likely prerenal from her diarrhea.  She also has severe hypokalemia which is probably made worse by IV insulin which has been transitioned to Lantus.  She will be aggressively repleted.  She was also noted to have hypomagnesemia.  She was given magnesium in the emergency department.  We will recheck her levels later this afternoon.  History of atrial fibrillation Patient is noted to be anticoagulated with apixaban which will be continued.  Hyperlipidemia Continue statin.  Chronic systolic CHF EF known to be 40 to 45%.  No evidence for CHF exacerbation.  She is actually somewhat hypovolemic.  Caution with IV fluids.  Essential hypertension Monitor blood pressures closely.   DVT Prophylaxis: On apixaban    Code Status: Full code Family Communication: Discussed with the patient.  No family at bedside Disposition Plan: Management as outlined above.  Start mobilizing soon.    LOS: 0 days   Osvaldo Shipper  [facility] Pager [phone] 07/12/2018,  10:55 AM  If 7PM-7AM, please contact night-coverage at [email or web address], password [facility]

## 2018-07-12 NOTE — ED Notes (Signed)
I-Stat Lactic Acid results of 2.95 reported to Dr. Blinda LeatherwoodPollina

## 2018-07-12 NOTE — ED Notes (Signed)
Pt has had multiple episodes of liquid stool. Occasionally incontinent, unable to tell when she has to go "it just comes out"

## 2018-07-12 NOTE — ED Notes (Signed)
Report called to Ryan, RN on 5W. 

## 2018-07-13 DIAGNOSIS — A0472 Enterocolitis due to Clostridium difficile, not specified as recurrent: Secondary | ICD-10-CM | POA: Diagnosis not present

## 2018-07-13 DIAGNOSIS — I2510 Atherosclerotic heart disease of native coronary artery without angina pectoris: Secondary | ICD-10-CM | POA: Diagnosis present

## 2018-07-13 DIAGNOSIS — I4891 Unspecified atrial fibrillation: Secondary | ICD-10-CM | POA: Diagnosis present

## 2018-07-13 DIAGNOSIS — Z90710 Acquired absence of both cervix and uterus: Secondary | ICD-10-CM | POA: Diagnosis not present

## 2018-07-13 DIAGNOSIS — E119 Type 2 diabetes mellitus without complications: Secondary | ICD-10-CM | POA: Diagnosis not present

## 2018-07-13 DIAGNOSIS — E785 Hyperlipidemia, unspecified: Secondary | ICD-10-CM | POA: Diagnosis present

## 2018-07-13 DIAGNOSIS — Z7982 Long term (current) use of aspirin: Secondary | ICD-10-CM | POA: Diagnosis not present

## 2018-07-13 DIAGNOSIS — Z6822 Body mass index (BMI) 22.0-22.9, adult: Secondary | ICD-10-CM | POA: Diagnosis not present

## 2018-07-13 DIAGNOSIS — I480 Paroxysmal atrial fibrillation: Secondary | ICD-10-CM | POA: Diagnosis not present

## 2018-07-13 DIAGNOSIS — I5022 Chronic systolic (congestive) heart failure: Secondary | ICD-10-CM | POA: Diagnosis present

## 2018-07-13 DIAGNOSIS — A0471 Enterocolitis due to Clostridium difficile, recurrent: Secondary | ICD-10-CM | POA: Diagnosis present

## 2018-07-13 DIAGNOSIS — R64 Cachexia: Secondary | ICD-10-CM | POA: Diagnosis present

## 2018-07-13 DIAGNOSIS — E876 Hypokalemia: Secondary | ICD-10-CM | POA: Diagnosis present

## 2018-07-13 DIAGNOSIS — Z7901 Long term (current) use of anticoagulants: Secondary | ICD-10-CM | POA: Diagnosis not present

## 2018-07-13 DIAGNOSIS — E860 Dehydration: Secondary | ICD-10-CM | POA: Diagnosis present

## 2018-07-13 DIAGNOSIS — E440 Moderate protein-calorie malnutrition: Secondary | ICD-10-CM | POA: Diagnosis present

## 2018-07-13 DIAGNOSIS — E871 Hypo-osmolality and hyponatremia: Secondary | ICD-10-CM | POA: Diagnosis present

## 2018-07-13 DIAGNOSIS — M170 Bilateral primary osteoarthritis of knee: Secondary | ICD-10-CM | POA: Diagnosis present

## 2018-07-13 DIAGNOSIS — Z9841 Cataract extraction status, right eye: Secondary | ICD-10-CM | POA: Diagnosis not present

## 2018-07-13 DIAGNOSIS — Z79899 Other long term (current) drug therapy: Secondary | ICD-10-CM | POA: Diagnosis not present

## 2018-07-13 DIAGNOSIS — D649 Anemia, unspecified: Secondary | ICD-10-CM | POA: Diagnosis not present

## 2018-07-13 DIAGNOSIS — Z8249 Family history of ischemic heart disease and other diseases of the circulatory system: Secondary | ICD-10-CM | POA: Diagnosis not present

## 2018-07-13 DIAGNOSIS — E861 Hypovolemia: Secondary | ICD-10-CM | POA: Diagnosis present

## 2018-07-13 DIAGNOSIS — I110 Hypertensive heart disease with heart failure: Secondary | ICD-10-CM | POA: Diagnosis present

## 2018-07-13 DIAGNOSIS — N179 Acute kidney failure, unspecified: Secondary | ICD-10-CM | POA: Diagnosis present

## 2018-07-13 DIAGNOSIS — E1110 Type 2 diabetes mellitus with ketoacidosis without coma: Secondary | ICD-10-CM | POA: Diagnosis present

## 2018-07-13 DIAGNOSIS — Z9842 Cataract extraction status, left eye: Secondary | ICD-10-CM | POA: Diagnosis not present

## 2018-07-13 LAB — GLUCOSE, CAPILLARY
GLUCOSE-CAPILLARY: 126 mg/dL — AB (ref 70–99)
GLUCOSE-CAPILLARY: 108 mg/dL — AB (ref 70–99)
Glucose-Capillary: 112 mg/dL — ABNORMAL HIGH (ref 70–99)
Glucose-Capillary: 132 mg/dL — ABNORMAL HIGH (ref 70–99)

## 2018-07-13 LAB — BASIC METABOLIC PANEL
Anion gap: 12 (ref 5–15)
BUN: 8 mg/dL (ref 8–23)
CALCIUM: 8 mg/dL — AB (ref 8.9–10.3)
CO2: 17 mmol/L — ABNORMAL LOW (ref 22–32)
Chloride: 111 mmol/L (ref 98–111)
Creatinine, Ser: 0.79 mg/dL (ref 0.44–1.00)
GFR calc Af Amer: >60 mL/min (ref >60)
GLUCOSE: 89 mg/dL (ref 70–99)
Potassium: 4.4 mmol/L (ref 3.5–5.1)
SODIUM: 140 mmol/L (ref 135–145)

## 2018-07-13 LAB — CBC
HCT: 32.9 % — ABNORMAL LOW (ref 36.0–46.0)
HEMOGLOBIN: 10.4 g/dL — AB (ref 12.0–15.0)
MCH: 27.9 pg (ref 26.0–34.0)
MCHC: 31.6 g/dL (ref 30.0–36.0)
MCV: 88.2 fL (ref 78.0–100.0)
Platelets: 166 10*3/uL (ref 150–400)
RBC: 3.73 MIL/uL — ABNORMAL LOW (ref 3.87–5.11)
RDW: 15.9 % — AB (ref 11.5–15.5)
WBC: 13.5 10*3/uL — ABNORMAL HIGH (ref 4.0–10.5)

## 2018-07-13 LAB — MAGNESIUM: MAGNESIUM: 1.8 mg/dL (ref 1.7–2.4)

## 2018-07-13 LAB — HEMOGLOBIN A1C
Hgb A1c MFr Bld: 6.7 % — ABNORMAL HIGH (ref 4.8–5.6)
MEAN PLASMA GLUCOSE: 145.59 mg/dL

## 2018-07-13 LAB — MRSA PCR SCREENING: MRSA BY PCR: NEGATIVE

## 2018-07-13 MED ORDER — INSULIN GLARGINE 100 UNIT/ML ~~LOC~~ SOLN
6.0000 [IU] | SUBCUTANEOUS | Status: DC
  Administered 2018-07-14: 6 [IU] via SUBCUTANEOUS
  Filled 2018-07-13: qty 0.06

## 2018-07-13 MED ORDER — GLUCERNA SHAKE PO LIQD
237.0000 mL | Freq: Three times a day (TID) | ORAL | Status: DC
  Administered 2018-07-13 – 2018-07-17 (×13): 237 mL via ORAL

## 2018-07-13 NOTE — Care Management Note (Signed)
Case Management Note  Patient Details  Name: Chelsea Ward MRN: [id number] Date of Birth: 09-29-1933  Subjective/Objective:    C.difficile. Hx of recent hospitalization for C. difficile colitis, atrial fibrillation, chronic systolic CHF, coronary artery disease, diabetes mellitus type 2, essential hypertension. From home with son [name]. [facility].   [name] ([address]) [name] [phone])     [phone]     PCP: Abbe AmsterdamShannon Banks  Action/Plan: Transition to home when medically stable with resumption of home health services. Pt declined SNF placement.   Son states will provide transportation to home once discharges.  Expected Discharge Date:                  Expected Discharge Plan:  Home w Home Health Services  In-House Referral:     Discharge planning Services  CM Consult  Post Acute Care Choice:  Resumption of Svcs/PTA Provider Choice offered to:  Patient  DME Arranged:  N/A DME Agency:  NA  HH Arranged:  RN, PT, Social Work, Nurse's Aide HH Agency:  [facility]  Status of Service:  Completed, signed off  If discussed at [employer] Meetings, dates discussed:    Additional Comments:  Epifanio LeschesCole, Rie Mcneil Hudson, RN 07/13/2018, 1:02 PM

## 2018-07-13 NOTE — Progress Notes (Signed)
Met patient and listened to her concerns and she wanted prayer for her family-mother is 82 years old!  Also for her siblings.  She did not share specifics-but since she she is 47 I clarified prayer for her mother and she said yes that she is 114.  Patient asked to have bed raised so she could eat breakfast.  OPffered pastoral presence, prayer and encouragement. Conard Novak, Chaplain   07/13/18 0900  Clinical Encounter Type  Visited With Patient  Visit Type Initial;Spiritual support  Referral From Patient  Consult/Referral To Chaplain  Spiritual Encounters  Spiritual Needs Prayer;Emotional;Other (Comment) (asked for prayer for family -mother is 31! )  Stress Factors  Patient Stress Factors Health changes  Family Stress Factors Other (Comment) (pray for my family)

## 2018-07-13 NOTE — Progress Notes (Signed)
Initial Nutrition Assessment  DOCUMENTATION CODES:   Non-severe (moderate) malnutrition in context of acute illness/injury  INTERVENTION:   - Glucerna Shake po TID, each supplement provides 220 kcal and 10 grams of protein  - Recommend daily MVI with minerals  NUTRITION DIAGNOSIS:   Moderate Malnutrition related to poor appetite, diarrhea, nausea, acute illness (C. Difficile colitis) as evidenced by mild fat depletion, mild muscle depletion, moderate muscle depletion, percent weight loss (7.1% weight loss in 1 month).  GOAL:   Patient will meet greater than or equal to 90% of their needs  MONITOR:   PO intake, Supplement acceptance, Skin, Labs, Weight trends  REASON FOR ASSESSMENT:   Malnutrition Screening Tool    ASSESSMENT:   84 year old female who presented to the ED with weakness and GI bleeding. PMH significant for type 2 diabetes mellitus, hypertension, CHF, CAD, hyperlipidemia, and atrial fibrillation on Eliquis. Pt has recently been admitted for C. Difficile.  Spoke with pt at bedside who reports having a poor appetite since discharging from her previous admission due to constant diarrhea and nausea. Pt endorses weight loss but is unsure of her UBW. Noted pt with 10 lb weight loss in 1 month (7.1% weight loss, significant for timeframe).  Pt endorses eating 3 meals daily but states that they are smaller than they were in the past.  B: grits, sausage, bacon, eggs L: freezer meal D: mashed potatoes and chicken  Pt reports drinking 2 oral nutrition supplements daily and is agreeable to receiving Glucerna during current admission. RD to order.  Meal Completion: 65%  Medications reviewed and include: Ensure Enlive BID, sliding scale Novolog, 10 units Lantus daily  Labs reviewed: CO2 17 (L), hemoglobin 10.4 (L), HCT 32.9 (L), hemoglobin A1C 6.7 (H) CBG's: 112, 81, 172, 186 x 24 hours  NUTRITION - FOCUSED PHYSICAL EXAM:    Most Recent Value  Orbital Region  Mild  depletion  Upper Arm Region  Mild depletion  Thoracic and Lumbar Region  Mild depletion  Buccal Region  Moderate depletion  Temple Region  Mild depletion  Clavicle Bone Region  Moderate depletion  Clavicle and Acromion Bone Region  Moderate depletion  Scapular Bone Region  Unable to assess  Dorsal Hand  Mild depletion  Patellar Region  No depletion  Anterior Thigh Region  Mild depletion  Posterior Calf Region  Mild depletion  Edema (RD Assessment)  None  Hair  Reviewed  Eyes  Reviewed  Mouth  Reviewed - poor dentition  Skin  Reviewed  Nails  Reviewed       Diet Order:   Diet Order            Diet Carb Modified Fluid consistency: Thin; Room service appropriate? Yes  Diet effective now              EDUCATION NEEDS:   No education needs have been identified at this time  Skin:  Skin Assessment: Reviewed RN Assessment  Last BM:  07/13/18 large type 7, rectal tube  Height:   Ht Readings from Last 1 Encounters:  07/11/18 5\' 4"  (1.626 m)    Weight:   Wt Readings from Last 1 Encounters:  07/11/18 59 kg    Ideal Body Weight:  54.5 kg  BMI:  Body mass index is 22.31 kg/m.  Estimated Nutritional Needs:   Kcal:  1650-1850 kcal  Protein:  80-95 grams  Fluid:  >1.6 L    Chelsea ReadingKate Jablonski Mourad Cwikla, MS, RD, LDN Pager: [phone] Weekend/After Hours: [phone]

## 2018-07-13 NOTE — Progress Notes (Signed)
CSW received consult regarding PT recommendation of SNF at discharge.  Patient is refusing SNF. She states that she would prefer to go home with her son. She is accepting of home health. Patient states her son will pick her up at discharge. RNCM alerted.  CSW signing off.   Osborne Cascoadia Moritz Lever LCSW [phone]

## 2018-07-13 NOTE — Progress Notes (Signed)
[facility] PROGRESS NOTE  Chelsea Ward ZOX:[id number] DOB: [birth year] DOA: 07/11/2018  PCP: Deeann Saint, MD  Brief History/Interval Summary: 84 year old African-American female with a past medical history significant for recent hospitalization for C. difficile colitis, atrial fibrillation, chronic systolic CHF, coronary artery disease, diabetes mellitus type 2, essential hypertension presented with dark stools at home.  Also generalized weakness.  Patient recently completed a 2-week course of oral vancomycin.  She tells me that her diarrhea had resolved.  Then she started again having loose stools few days ago.  Seen by her PCPs office, underwent some testing and medications were adjusted.  She presented to the emergency department due to persistent symptoms.  Patient was noted to have acute renal failure with hypokalemia, some concern for DKA due to increased anion gap.  Stool was again positive for C. difficile.  She was started on an insulin infusion and was hospitalized.  She was transitioned to subcutaneous insulin.  Diarrhea persists.  Reason for Visit: Recurrent C. difficile colitis  Consultants: Infectious disease  Procedures: None  Antibiotics: Oral vancomycin  Subjective/Interval History: Patient states that she is feeling slightly better.  Continues to have loose stools.  Denies any nausea vomiting.  Abdominal pain has improved.  Denies any shortness of breath.    ROS: Denies any headaches  Objective:  Vital Signs  Vitals:   07/12/18 1200 07/12/18 2017 07/13/18 0054 07/13/18 0412  BP: (!) 98/59 107/63 124/60 111/60  Pulse: 75 81 68 71  Resp: 18 14 16 15   Temp:  97.9 F (36.6 C) 97.9 F (36.6 C) 97.7 F (36.5 C)  TempSrc:  Oral Oral Oral  SpO2: 98% 100% 100% 99%  Weight:      Height:        Intake/Output Summary (Last 24 hours) at 07/13/2018 1150 Last data filed at 07/13/2018 0645 Gross per 24 hour  Intake 1780.83 ml  Output 450 ml  Net 1330.83 ml    Filed Weights   07/11/18 1823  Weight: 59 kg    General appearance: Patient is awake alert.  Cooperative.  Slightly fatigued.  But no distress. Resp: Clear to auscultation bilaterally.  No wheezing rales or rhonchi Cardio: S1-S2 is normal regular.  No S3-S4.  No rubs murmurs or bruit. GI: Abdomen is soft.  Not noted to be tender today.  Bowel sounds present.  No masses organomegaly. Extremities: No edema Neurologic: No focal neurological deficits noted.  Lab Results:  Data Reviewed: I have personally reviewed following labs and imaging studies  CBC: Recent Labs  Lab 07/11/18 1832  07/12/18 0021 07/12/18 0312 07/12/18 0457 07/12/18 0808 07/13/18 0525  WBC 13.2*  --   --   --   --   --  13.5*  HGB 12.0   < > 12.2 12.2 10.5* 10.2* 10.4*  HCT 38.3   < > 36.0 36.0 31.0* 30.0* 32.9*  MCV 88.7  --   --   --   --   --  88.2  PLT 168  --   --   --   --   --  166   < > = values in this interval not displayed.    Basic Metabolic Panel: Recent Labs  Lab 07/11/18 1832 07/11/18 2137  07/12/18 0011  07/12/18 0450 07/12/18 0457 07/12/18 0808 07/12/18 1241 07/13/18 0525  NA 133*  --    < > 134*   < > 132* 131* 132* 133* 140  K 3.0*  --    < >  3.0*   < > 2.8* 2.5* 2.2* 3.0* 4.4  CL 95*  --    < > 100   < > 99 100 99 102 111  CO2 18*  --   --  17*  --  19*  --   --  21* 17*  GLUCOSE 147*  --    < > 113*   < > 134* 139* 154* 137* 89  BUN 23  --    < > 20   < > 15 15 12 11 8   CREATININE 1.54*  --    < > 1.26*   < > 1.04* 0.90 0.80 0.98 0.79  CALCIUM 8.8*  --   --  8.3*  --  7.6*  --   --  7.7* 8.0*  MG 1.6*  --   --   --   --   --   --   --  1.8 1.8  PHOS  --  2.8  --   --   --   --   --   --   --   --    < > = values in this interval not displayed.    GFR: Estimated Creatinine Clearance: 46 mL/min (by C-G formula based on SCr of 0.79 mg/dL).  Liver Function Tests: Recent Labs  Lab 07/11/18 1832  AST 20  ALT 17  ALKPHOS 51  BILITOT 1.6*  PROT 7.7  ALBUMIN 3.1*     Recent Labs  Lab 07/11/18 1832  LIPASE 25   Coagulation Profile: Recent Labs  Lab 07/11/18 2137  INR 1.47    CBG: Recent Labs  Lab 07/12/18 1117 07/12/18 1426 07/12/18 1653 07/12/18 2241 07/13/18 0757  GLUCAP 132* 186* 172* 81 112*     Recent Results (from the past 240 hour(s))  C difficile quick scan w PCR reflex     Status: Abnormal   Collection Time: 07/12/18  4:58 AM  Result Value Ref Range Status   C Diff antigen POSITIVE (A) NEGATIVE Final   C Diff toxin POSITIVE (A) NEGATIVE Final   C Diff interpretation Toxin producing C. difficile detected.  Final    Comment: CRITICAL RESULT CALLED TO, READ BACK BY AND VERIFIED WITHDeniece Ree RN 1610 07/12/18 Performed at [facility], [address] [place], [place] [address]   Gastrointestinal Panel by PCR , Stool     Status: None   Collection Time: 07/12/18  4:58 AM  Result Value Ref Range Status   Campylobacter species NOT DETECTED NOT DETECTED Final   Plesimonas shigelloides NOT DETECTED NOT DETECTED Final   Salmonella species NOT DETECTED NOT DETECTED Final   Yersinia enterocolitica NOT DETECTED NOT DETECTED Final   Vibrio species NOT DETECTED NOT DETECTED Final   Vibrio cholerae NOT DETECTED NOT DETECTED Final   Enteroaggregative E coli (EAEC) NOT DETECTED NOT DETECTED Final   Enteropathogenic E coli (EPEC) NOT DETECTED NOT DETECTED Final   Enterotoxigenic E coli (ETEC) NOT DETECTED NOT DETECTED Final   Shiga like toxin producing E coli (STEC) NOT DETECTED NOT DETECTED Final   Shigella/Enteroinvasive E coli (EIEC) NOT DETECTED NOT DETECTED Final   Cryptosporidium NOT DETECTED NOT DETECTED Final   Cyclospora cayetanensis NOT DETECTED NOT DETECTED Final   Entamoeba histolytica NOT DETECTED NOT DETECTED Final   Giardia lamblia NOT DETECTED NOT DETECTED Final   Adenovirus F40/41 NOT DETECTED NOT DETECTED Final   Astrovirus NOT DETECTED NOT DETECTED Final   Norovirus GI/GII NOT DETECTED NOT DETECTED  Final   Rotavirus A NOT DETECTED NOT DETECTED Final   Sapovirus (I, II, IV, and V) NOT DETECTED NOT DETECTED Final    Comment: Performed at [facility], [address] [place], [place] [address]  MRSA PCR Screening     Status: None   Collection Time: 07/13/18 12:19 AM  Result Value Ref Range Status   MRSA by PCR NEGATIVE NEGATIVE Final    Comment:        The GeneXpert MRSA Assay (FDA approved for NASAL specimens only), is one component of a comprehensive MRSA colonization surveillance program. It is not intended to diagnose MRSA infection nor to guide or monitor treatment for MRSA infections. Performed at [facility], [address] [place], [place] [address]       Radiology Studies: Ct Abdomen Pelvis Wo Contrast  Result Date: 07/12/2018 CLINICAL DATA:  Acute onset of generalized weakness. Black stools and blood in stool. EXAM: CT ABDOMEN AND PELVIS WITHOUT CONTRAST TECHNIQUE: Multidetector CT imaging of the abdomen and pelvis was performed following the standard protocol without IV contrast. COMPARISON:  CT of the abdomen and pelvis from 05/16/2018 FINDINGS: Lower chest: Mild bibasilar atelectasis or scarring is noted, with minimal honeycombing at the right lung base. Calcification is noted at the aortic valve. Hepatobiliary: The liver is unremarkable in appearance. The gallbladder is unremarkable in appearance. The common bile duct remains normal in caliber. Pancreas: The pancreas is within normal limits. Spleen: The spleen is unremarkable in appearance. Adrenals/Urinary Tract: The adrenal glands are unremarkable in appearance. The kidneys are within normal limits. There is no evidence of hydronephrosis. No renal or ureteral stones are identified. Mild nonspecific left-sided perinephric stranding is noted. Stomach/Bowel: The stomach is unremarkable in appearance. The small bowel is within normal limits. The appendix is normal in caliber, without evidence of  appendicitis. Mild wall thickening is noted along the ascending colon, and more diffuse wall thickening is seen along the descending and sigmoid colon, concerning for infectious or inflammatory colitis. Trace associated free fluid is noted. Vascular/Lymphatic: Scattered calcification is seen along the abdominal aorta and its branches. The abdominal aorta is otherwise grossly unremarkable. The inferior vena cava is grossly unremarkable. No retroperitoneal lymphadenopathy is seen. No pelvic sidewall lymphadenopathy is identified. Reproductive: The bladder is mildly distended and grossly unremarkable. The patient is status post hysterectomy. No suspicious adnexal masses are seen. Other: No additional soft tissue abnormalities are seen. Musculoskeletal: No acute osseous abnormalities are identified. Facet disease is noted along the lower lumbar spine. The visualized musculature is unremarkable in appearance. IMPRESSION: 1. Mild wall thickening along the ascending colon, and more diffuse wall thickening along the descending and sigmoid colon, concerning for infectious or inflammatory colitis. Trace associated free fluid noted. 2. Mild bibasilar atelectasis or scarring, with minimal honeycombing at the right lung base. Aortic Atherosclerosis (ICD10-I70.0). Electronically Signed   By: Roanna RaiderJeffery  Chang M.D.   On: 07/12/2018 04:28     Medications:  Scheduled: . apixaban  2.5 mg Oral BID  . ezetimibe  10 mg Oral Daily  . feeding supplement (GLUCERNA SHAKE)  237 mL Oral TID BM  . insulin aspart  0-15 Units Subcutaneous TID WC  . insulin glargine  10 Units Subcutaneous Q24H  . loratadine  10 mg Oral Daily  . metoprolol tartrate  12.5 mg Oral BID  . simvastatin  40 mg Oral Daily  . vancomycin  125 mg Oral QID   Continuous: . sodium chloride 75 mL/hr at 07/13/18 0202  PRN:  Assessment/Plan:    Possible diabetic ketoacidosis No ketones noted in the urine.  Her beta hydroxy butyrate acid was elevated.   Anion gap was noted to be elevated.  Patient was placed on IV insulin.  Her anion gap closed.  Bicarbonate levels improved.  She was transitioned to Lantus.  HbA1c 6.7.  Continue SSI.  Continue to monitor CBGs.   Recurrent C. difficile colitis Patient had her first episode of C. difficile in June.  She has recurrence of her C. difficile in July.  She completed 2 different courses of oral vancomycin for 2 weeks duration.  She appears to be having another recurrence considering her diarrhea.  She has improved with the previous courses of vancomycin.  Continue vancomycin orally for now.  She will need a prolonged course.  If she does not improve then we may have to consider alternative treatment and then we may need ID input.  Continue to watch for now.  CT scan showed mild colitis.  Abdominal examination has improved today.  Patient's family mentioned dark stool however her heme testing was negative.  Hemoglobin is stable.  Does not appear to be a GI bleed at this time.  Acute kidney injury with severe hypokalemia Renal function has improved with hydration.  This was likely prerenal due to her diarrhea.  Potassium level has also significantly improved.  Magnesium is normal today.  Continue to monitor periodically.    History of atrial fibrillation Patient is noted to be anticoagulated with apixaban which will be continued.  Hyperlipidemia Continue statin.  Chronic systolic CHF EF known to be 40 to 45%.  No evidence for CHF exacerbation.  She was actually somewhat hypovolemic.  Cut back on IV fluids.  Essential hypertension Blood pressure is reasonably well controlled.  She is noted to be on low-dose metoprolol.   DVT Prophylaxis: On apixaban    Code Status: Full code Family Communication: Discussed with the patient.  No family at bedside Disposition Plan: Management as outlined above.  PT and OT evaluation.    LOS: 0 days   Osvaldo Shipper  [facility] Pager [phone] 07/13/2018,  11:50 AM  If 7PM-7AM, please contact night-coverage at [email or web address], password [facility]

## 2018-07-13 NOTE — Evaluation (Signed)
Occupational Therapy Evaluation Patient Details Name: Chelsea Ward MRN: [id number] DOB: Nov 26, 1933 Today's Date: 07/13/2018    History of Present Illness Patient is an 84 y/o female presenting to the ED on 07/11/18 with dizziness and abnormal stool. Noted positive for C.Diff. PMH significant for C. Difficile, atrial fibrillation, CHF, CAD, HLD , DM 2, HTN.   Clinical Impression   This 84 yo female admitted with above presents to acute OT at a min -Mod A level for basic ADLs (pt reports son has had to A her more lately due to her not feeling well). She will benefit from acute OT with follow up OT at SNF to work towards a more independent level and decrease caregiver burden.    Follow Up Recommendations  Home health OT;Supervision/Assistance - 24 hour    Equipment Recommendations  None recommended by OT       Precautions / Restrictions Precautions Precautions: Fall Restrictions Weight Bearing Restrictions: No      Mobility Bed Mobility Overal bed mobility: Needs Assistance Bed Mobility: Supine to Sit Rolling: Supervision   Supine to sit: Min assist     General bed mobility comments: help with legs  Transfers Overall transfer level: Needs assistance Equipment used: 1 person hand held assist Transfers: Sit to/from Stand;Stand Pivot Transfers Sit to Stand: Min assist Stand pivot transfers: Min assist            Balance Overall balance assessment: Needs assistance Sitting-balance support: No upper extremity supported;Feet unsupported Sitting balance-Leahy Scale: Good     Standing balance support: Single extremity supported Standing balance-Leahy Scale: Poor                             ADL either performed or assessed with clinical judgement   ADL Overall ADL's : Needs assistance/impaired Eating/Feeding: Independent;Sitting   Grooming: Set up;Sitting;Supervision/safety   Upper Body Bathing: Set up;Sitting;Supervision/ safety   Lower Body Bathing:  Minimal assistance;Sit to/from stand   Upper Body Dressing : Set up;Supervision/safety;Sitting   Lower Body Dressing: Moderate assistance Lower Body Dressing Details (indicate cue type and reason): min A sit<>stand Toilet Transfer: Minimal assistance;Stand-pivot   Toileting- Clothing Manipulation and Hygiene: Moderate assistance Toileting - Clothing Manipulation Details (indicate cue type and reason): min A sit<>stand             Vision Patient Visual Report: No change from baseline              Pertinent Vitals/Pain Pain Assessment: Faces Faces Pain Scale: Hurts a little bit Pain Location: abdomen Pain Descriptors / Indicators: Aching Pain Intervention(s): Repositioned;Monitored during session     Hand Dominance Right   Extremity/Trunk Assessment Upper Extremity Assessment Upper Extremity Assessment: Generalized weakness           Communication Communication Communication: HOH   Cognition   Behavior During Therapy: WFL for tasks assessed/performed Overall Cognitive Status: Within Functional Limits for tasks assessed                                                Home Living Family/patient expects to be discharged to:: Skilled nursing facility Living Arrangements: Children Available Help at Discharge: Family;Available 24 hours/day Type of Home: House Home Access: Stairs to enter Entergy CorporationEntrance Stairs-Number of Steps: 6 Entrance Stairs-Rails: Right;Left Home Layout: One level     Bathroom  Shower/Tub: IT trainer: Standard     Home Equipment: Environmental consultant - 4 wheels;Cane - single point;Grab bars - tub/shower;Grab bars - toilet;Bedside commode;Shower seat   Additional Comments: Pt lives with son.  Per pt report, son is with her most of the day      Prior Functioning/Environment Level of Independence: Independent with assistive device(s);Needs assistance  Gait / Transfers Assistance Needed: walks with  walker ADL's / Homemaking Assistance Needed: reports son helps with bathing and dressing            OT Problem List: Decreased strength;Impaired balance (sitting and/or standing);Pain      OT Treatment/Interventions: Self-care/ADL training;DME and/or AE instruction;Therapeutic activities;Balance training;Patient/family education    OT Goals(Current goals can be found in the care plan section) Acute Rehab OT Goals Patient Stated Goal: to go home OT Goal Formulation: With patient Time For Goal Achievement: 07/27/18 Potential to Achieve Goals: Good  OT Frequency: Min 2X/week              AM-PAC PT "6 Clicks" Daily Activity     Outcome Measure Help from another person eating meals?: None Help from another person taking care of personal grooming?: A Little Help from another person toileting, which includes using toliet, bedpan, or urinal?: A Lot Help from another person bathing (including washing, rinsing, drying)?: A Lot Help from another person to put on and taking off regular upper body clothing?: A Little Help from another person to put on and taking off regular lower body clothing?: A Lot 6 Click Score: 16   End of Session Equipment Utilized During Treatment: Gait belt Nurse Communication: (NT: pt needed purewick replaced)  Activity Tolerance: Patient tolerated treatment well Patient left: in bed;with call bell/phone within reach;with bed alarm set  OT Visit Diagnosis: Muscle weakness (generalized) (M62.81);Pain;Unsteadiness on feet (R26.81) Pain - part of body: (abdomen)                Time: 1610-9604 OT Time Calculation (min): 15 min Charges:  OT General Charges $OT Visit: 1 Visit OT Evaluation $OT Eval Moderate Complexity: [address], [place] [phone] 07/13/2018

## 2018-07-14 DIAGNOSIS — E440 Moderate protein-calorie malnutrition: Secondary | ICD-10-CM

## 2018-07-14 LAB — BASIC METABOLIC PANEL
ANION GAP: 7 (ref 5–15)
BUN: 5 mg/dL — ABNORMAL LOW (ref 8–23)
CHLORIDE: 113 mmol/L — AB (ref 98–111)
CO2: 18 mmol/L — AB (ref 22–32)
Calcium: 8.1 mg/dL — ABNORMAL LOW (ref 8.9–10.3)
Creatinine, Ser: 0.68 mg/dL (ref 0.44–1.00)
GFR calc Af Amer: >60 mL/min (ref >60)
GFR calc non Af Amer: >60 mL/min (ref >60)
Glucose, Bld: 94 mg/dL (ref 70–99)
POTASSIUM: 4.1 mmol/L (ref 3.5–5.1)
Sodium: 138 mmol/L (ref 135–145)

## 2018-07-14 LAB — GLUCOSE, CAPILLARY
GLUCOSE-CAPILLARY: 99 mg/dL (ref 70–99)
GLUCOSE-CAPILLARY: 165 mg/dL — AB (ref 70–99)
Glucose-Capillary: 80 mg/dL (ref 70–99)
Glucose-Capillary: 104 mg/dL — ABNORMAL HIGH (ref 70–99)

## 2018-07-14 LAB — CBC
HCT: 32.9 % — ABNORMAL LOW (ref 36.0–46.0)
HEMOGLOBIN: 10.2 g/dL — AB (ref 12.0–15.0)
MCH: 27.9 pg (ref 26.0–34.0)
MCHC: 31 g/dL (ref 30.0–36.0)
MCV: 90.1 fL (ref 78.0–100.0)
Platelets: 151 10*3/uL (ref 150–400)
RBC: 3.65 MIL/uL — AB (ref 3.87–5.11)
RDW: 16 % — ABNORMAL HIGH (ref 11.5–15.5)
WBC: 10.5 10*3/uL (ref 4.0–10.5)

## 2018-07-14 NOTE — Discharge Instructions (Signed)

## 2018-07-14 NOTE — Progress Notes (Signed)
[facility] PROGRESS NOTE  Chelsea Ward ZOX:[id number] DOB: Mar 26, 1933 DOA: 07/11/2018  PCP: Deeann Saint, MD  Brief History/Interval Summary: 84 year old African-American female with a past medical history significant for recent hospitalization for C. difficile colitis, atrial fibrillation, chronic systolic CHF, coronary artery disease, diabetes mellitus type 2, essential hypertension presented with dark stools at home.  Also generalized weakness.  Patient recently completed a 2-week course of oral vancomycin.  She tells me that her diarrhea had resolved.  Then she started again having loose stools few days ago.  Seen by her PCPs office, underwent some testing and medications were adjusted.  She presented to the emergency department due to persistent symptoms.  Patient was noted to have acute renal failure with hypokalemia, some concern for DKA due to increased anion gap.  Stool was again positive for C. difficile.  She was started on an insulin infusion and was hospitalized.  She was transitioned to subcutaneous insulin.  Diarrhea persists.  Reason for Visit: Recurrent C. difficile colitis  Consultants: Infectious disease  Procedures: None  Antibiotics: Oral vancomycin  Subjective/Interval History: Patient states that she continues to have diarrhea.  Continues to have abdominal discomfort.  Some nausea but no vomiting.     ROS: Denies any headaches  Objective:  Vital Signs  Vitals:   07/14/18 0008 07/14/18 0355 07/14/18 0434 07/14/18 0853  BP: (!) 144/72 135/72  111/73  Pulse: 66 67  78  Resp: 15 17  18   Temp: 98.1 F (36.7 C) 98 F (36.7 C) 98.4 F (36.9 C) 98.2 F (36.8 C)  TempSrc: Oral Oral Oral Oral  SpO2: 100% 99%  100%  Weight:      Height:        Intake/Output Summary (Last 24 hours) at 07/14/2018 1244 Last data filed at 07/14/2018 1049 Gross per 24 hour  Intake 1776.6 ml  Output 1900 ml  Net -123.4 ml   Filed Weights   07/11/18 1823  Weight: 59  kg    General appearance: Patient is awake alert.  Cooperative.  No distress. Resp: Clear to auscultation bilaterally.  No wheezing rales or rhonchi. Cardio: S1-S2 is normal regular.  No S3-S4.  No rubs murmurs or bruit GI: Abdomen is soft.  Nondistended.  Mildly tender diffusely without any rebound rigidity or guarding.  No masses organomegaly. Extremities: No edema Neurologic: No focal neurological deficits  Lab Results:  Data Reviewed: I have personally reviewed following labs and imaging studies  CBC: Recent Labs  Lab 07/11/18 1832  07/12/18 0312 07/12/18 0457 07/12/18 0808 07/13/18 0525 07/14/18 0447  WBC 13.2*  --   --   --   --  13.5* 10.5  HGB 12.0   < > 12.2 10.5* 10.2* 10.4* 10.2*  HCT 38.3   < > 36.0 31.0* 30.0* 32.9* 32.9*  MCV 88.7  --   --   --   --  88.2 90.1  PLT 168  --   --   --   --  166 151   < > = values in this interval not displayed.    Basic Metabolic Panel: Recent Labs  Lab 07/11/18 1832 07/11/18 2137  07/12/18 0011  07/12/18 0450 07/12/18 0457 07/12/18 0808 07/12/18 1241 07/13/18 0525 07/14/18 0447  NA 133*  --    < > 134*   < > 132* 131* 132* 133* 140 138  K 3.0*  --    < > 3.0*   < > 2.8* 2.5* 2.2* 3.0* 4.4 4.1  CL 95*  --    < > 100   < > 99 100 99 102 111 113*  CO2 18*  --   --  17*  --  19*  --   --  21* 17* 18*  GLUCOSE 147*  --    < > 113*   < > 134* 139* 154* 137* 89 94  BUN 23  --    < > 20   < > 15 15 12 11 8  5*  CREATININE 1.54*  --    < > 1.26*   < > 1.04* 0.90 0.80 0.98 0.79 0.68  CALCIUM 8.8*  --   --  8.3*  --  7.6*  --   --  7.7* 8.0* 8.1*  MG 1.6*  --   --   --   --   --   --   --  1.8 1.8  --   PHOS  --  2.8  --   --   --   --   --   --   --   --   --    < > = values in this interval not displayed.    GFR: Estimated Creatinine Clearance: 46 mL/min (by C-G formula based on SCr of 0.68 mg/dL).  Liver Function Tests: Recent Labs  Lab 07/11/18 1832  AST 20  ALT 17  ALKPHOS 51  BILITOT 1.6*  PROT 7.7  ALBUMIN  3.1*    Recent Labs  Lab 07/11/18 1832  LIPASE 25   Coagulation Profile: Recent Labs  Lab 07/11/18 2137  INR 1.47    CBG: Recent Labs  Lab 07/13/18 1211 07/13/18 1808 07/13/18 2153 07/14/18 0802 07/14/18 1158  GLUCAP 132* 126* 108* 80 99     Recent Results (from the past 240 hour(s))  Culture, blood (x 2)     Status: None (Preliminary result)   Collection Time: 07/12/18  2:40 AM  Result Value Ref Range Status   Specimen Description BLOOD RIGHT WRIST  Final   Special Requests   Final    BOTTLES DRAWN AEROBIC ONLY Blood Culture results may not be optimal due to an inadequate volume of blood received in culture bottles   Culture   Final    NO GROWTH 2 DAYS Performed at [facility], [address], [place], [place] [address]    Report Status PENDING  Incomplete  Culture, blood (x 2)     Status: None (Preliminary result)   Collection Time: 07/12/18  2:53 AM  Result Value Ref Range Status   Specimen Description BLOOD RIGHT HAND  Final   Special Requests   Final    BOTTLES DRAWN AEROBIC AND ANAEROBIC Blood Culture results may not be optimal due to an inadequate volume of blood received in culture bottles   Culture   Final    NO GROWTH 2 DAYS Performed at [facility], [address] [place], [place] [address]    Report Status PENDING  Incomplete  C difficile quick scan w PCR reflex     Status: Abnormal   Collection Time: 07/12/18  4:58 AM  Result Value Ref Range Status   C Diff antigen POSITIVE (A) NEGATIVE Final   C Diff toxin POSITIVE (A) NEGATIVE Final   C Diff interpretation Toxin producing C. difficile detected.  Final    Comment: CRITICAL RESULT CALLED TO, READ BACK BY AND VERIFIED WITHDeniece Ree RN [id number] 07/12/18 Performed at [facility], [address], [place], [place] [address]   Gastrointestinal Panel by PCR , Stool     Status: None   Collection Time: 07/12/18  4:58 AM  Result Value Ref Range Status   Campylobacter species NOT  DETECTED NOT DETECTED Final   Plesimonas shigelloides NOT DETECTED NOT DETECTED Final   Salmonella species NOT DETECTED NOT DETECTED Final   Yersinia enterocolitica NOT DETECTED NOT DETECTED Final   Vibrio species NOT DETECTED NOT DETECTED Final   Vibrio cholerae NOT DETECTED NOT DETECTED Final   Enteroaggregative E coli (EAEC) NOT DETECTED NOT DETECTED Final   Enteropathogenic E coli (EPEC) NOT DETECTED NOT DETECTED Final   Enterotoxigenic E coli (ETEC) NOT DETECTED NOT DETECTED Final   Shiga like toxin producing E coli (STEC) NOT DETECTED NOT DETECTED Final   Shigella/Enteroinvasive E coli (EIEC) NOT DETECTED NOT DETECTED Final   Cryptosporidium NOT DETECTED NOT DETECTED Final   Cyclospora cayetanensis NOT DETECTED NOT DETECTED Final   Entamoeba histolytica NOT DETECTED NOT DETECTED Final   Giardia lamblia NOT DETECTED NOT DETECTED Final   Adenovirus F40/41 NOT DETECTED NOT DETECTED Final   Astrovirus NOT DETECTED NOT DETECTED Final   Norovirus GI/GII NOT DETECTED NOT DETECTED Final   Rotavirus A NOT DETECTED NOT DETECTED Final   Sapovirus (I, II, IV, and V) NOT DETECTED NOT DETECTED Final    Comment: Performed at [facility], [address] [place], [place] [address]  MRSA PCR Screening     Status: None   Collection Time: 07/13/18 12:19 AM  Result Value Ref Range Status   MRSA by PCR NEGATIVE NEGATIVE Final    Comment:        The GeneXpert MRSA Assay (FDA approved for NASAL specimens only), is one component of a comprehensive MRSA colonization surveillance program. It is not intended to diagnose MRSA infection nor to guide or monitor treatment for MRSA infections. Performed at [facility], [address], [place], [place] [address]       Radiology Studies: No results found.   Medications:  Scheduled: . apixaban  2.5 mg Oral BID  . ezetimibe  10 mg Oral Daily  . feeding supplement (GLUCERNA SHAKE)  237 mL Oral TID BM  . insulin aspart  0-15 Units  Subcutaneous TID WC  . insulin glargine  6 Units Subcutaneous Q24H  . loratadine  10 mg Oral Daily  . metoprolol tartrate  12.5 mg Oral BID  . simvastatin  40 mg Oral Daily  . vancomycin  125 mg Oral QID   Continuous: . sodium chloride 50 mL/hr at 07/13/18 1833   PRN:  Assessment/Plan:    Possible diabetic ketoacidosis No ketones were noted in the urine.  Her beta hydroxy butyrate acid was elevated.  Anion gap was noted to be elevated.  Patient was placed on IV insulin.  Her anion gap closed.  Bicarbonate levels improved.  She was transitioned to Lantus.  HbA1c 6.7.  CBG stable but somewhat on the lower side.  Will discontinue Lantus.  Continue just SSI for now.    C. 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 difficile in June.  She has recurrence of her C. difficile in July.  She completed 2 different courses of oral vancomycin for 2 weeks duration.  She appears to be having another recurrence considering her diarrhea.  She has improved with the previous courses of vancomycin.  Continues to have significant stool output.  Her abdomen remains benign.  Continue oral vancomycin  for now.  If she does not show any improvement in the next 24 to 48 hours we will consult ID as patient may need alternative treatment.  No evidence for GI bleed at this time.  CT scan done on 8/8 showed mild colitis without any evidence for ileus.  Acute kidney injury with severe hypokalemia Renal function has improved with hydration.  This was likely prerenal due to her diarrhea.  Potassium level has improved as well.  Magnesium normal yesterday.  Continue to monitor periodically.     History of atrial fibrillation Patient is noted to be anticoagulated with apixaban which will be continued.  Hyperlipidemia Continue statin.  Chronic systolic CHF EF known to be 40 to 45%.  No evidence for CHF exacerbation.  She was actually somewhat hypovolemic.  Continue gentle hydration for now.   Watch volume status closely.  Essential hypertension Blood pressure is reasonably well controlled.  She is noted to be on low-dose metoprolol.   DVT Prophylaxis: On apixaban    Code Status: Full code Family Communication: Discussed with the patient.  No family at bedside Disposition Plan: Management as outlined above.  Patient seen by PT and OT.  Declines skilled nursing facility placement as of now.  Continue to mobilize.    LOS: 1 day   Osvaldo ShipperGokul Chukwuemeka Artola  [facility] Pager [phone] 07/14/2018, 12:44 PM  If 7PM-7AM, please contact night-coverage at [email or web address], password [email or web address]

## 2018-07-15 LAB — CBC
HEMATOCRIT: 31 % — AB (ref 36.0–46.0)
Hemoglobin: 9.7 g/dL — ABNORMAL LOW (ref 12.0–15.0)
MCH: 28 pg (ref 26.0–34.0)
MCHC: 31.3 g/dL (ref 30.0–36.0)
MCV: 89.3 fL (ref 78.0–100.0)
Platelets: 160 10*3/uL (ref 150–400)
RBC: 3.47 MIL/uL — AB (ref 3.87–5.11)
RDW: 15.8 % — ABNORMAL HIGH (ref 11.5–15.5)
WBC: 9.8 10*3/uL (ref 4.0–10.5)

## 2018-07-15 LAB — BASIC METABOLIC PANEL
ANION GAP: 7 (ref 5–15)
BUN: 5 mg/dL — ABNORMAL LOW (ref 8–23)
CHLORIDE: 113 mmol/L — AB (ref 98–111)
CO2: 20 mmol/L — AB (ref 22–32)
Calcium: 7.9 mg/dL — ABNORMAL LOW (ref 8.9–10.3)
Creatinine, Ser: 0.69 mg/dL (ref 0.44–1.00)
GFR calc Af Amer: >60 mL/min (ref >60)
GFR calc non Af Amer: >60 mL/min (ref >60)
Glucose, Bld: 100 mg/dL — ABNORMAL HIGH (ref 70–99)
POTASSIUM: 3.6 mmol/L (ref 3.5–5.1)
Sodium: 140 mmol/L (ref 135–145)

## 2018-07-15 LAB — GLUCOSE, CAPILLARY
GLUCOSE-CAPILLARY: 108 mg/dL — AB (ref 70–99)
GLUCOSE-CAPILLARY: 115 mg/dL — AB (ref 70–99)
GLUCOSE-CAPILLARY: 140 mg/dL — AB (ref 70–99)
Glucose-Capillary: 89 mg/dL (ref 70–99)

## 2018-07-15 MED ORDER — VANCOMYCIN 50 MG/ML ORAL SOLUTION: 125.0000 mg | Freq: Two times a day (BID) | ORAL | Status: DC

## 2018-07-15 MED ORDER — VANCOMYCIN 50 MG/ML ORAL SOLUTION: 125.0000 mg | Freq: Every day | ORAL | Status: DC

## 2018-07-15 MED ORDER — VANCOMYCIN 50 MG/ML ORAL SOLUTION
125.0000 mg | Freq: Four times a day (QID) | ORAL | Status: DC
  Administered 2018-07-15 – 2018-07-17 (×9): 125 mg via ORAL
  Filled 2018-07-15 (×11): qty 2.5

## 2018-07-15 MED ORDER — VANCOMYCIN 50 MG/ML ORAL SOLUTION: 125.0000 mg | ORAL | Status: DC

## 2018-07-15 NOTE — Progress Notes (Signed)
[facility] PROGRESS NOTE  Chelsea Ward [id number] DOB: 11-May-1933 DOA: 07/11/2018  PCP: Deeann SaintBanks, Shannon R, MD  Brief History/Interval Summary: 84 year old African-American female with a past medical history significant for recent hospitalization for C. difficile colitis, atrial fibrillation, chronic systolic CHF, coronary artery disease, diabetes mellitus type 2, essential hypertension presented with dark stools at home.  Also generalized weakness.  Patient recently completed a 2-week course of oral vancomycin.  She tells me that her diarrhea had resolved.  Then she started again having loose stools few days ago.  Seen by her PCPs office, underwent some testing and medications were adjusted.  She presented to the emergency department due to persistent symptoms.  Patient was noted to have acute renal failure with hypokalemia, some concern for DKA due to increased anion gap.  Stool was again positive for C. difficile.  She was started on an insulin infusion and was hospitalized.  She was transitioned to subcutaneous insulin.  Diarrhea persists.  Reason for Visit: Recurrent C. difficile colitis  Consultants: None  Procedures: None  Antibiotics: Oral vancomycin  Subjective/Interval History: Patient states that she feels better this morning.  Discussed with nursing staff.  It appears that the diarrhea is slowing down.  No nausea or vomiting.    ROS: Denies any headaches  Objective:  Vital Signs  Vitals:   07/14/18 2055 07/15/18 0055 07/15/18 0455 07/15/18 0747  BP: (!) 144/88 (!) 141/78 (!) 144/65   Pulse: 62 67 62   Resp: 18 16 20    Temp:    98.4 F (36.9 C)  TempSrc:    Oral  SpO2: 100% 98% 100%   Weight:      Height:        Intake/Output Summary (Last 24 hours) at 07/15/2018 1157 Last data filed at 07/15/2018 0900 Gross per 24 hour  Intake 660 ml  Output 300 ml  Net 360 ml   Filed Weights   07/11/18 1823  Weight: 59 kg    General appearance: Awake alert.   In no distress. Resp: Clear to auscultation bilaterally.  No wheezing rales or rhonchi Cardio: S1-S2 is normal regular.  No S3-S4.  No rubs murmurs or bruit GI: Abdomen is soft.  Nontender nondistended.  No masses organomegaly  Extremities: No pedal edema Neurologic: No focal neurological deficits  Lab Results:  Data Reviewed: I have personally reviewed following labs and imaging studies  CBC: Recent Labs  Lab 07/11/18 1832  07/12/18 0457 07/12/18 0808 07/13/18 0525 07/14/18 0447 07/15/18 0204  WBC 13.2*  --   --   --  13.5* 10.5 9.8  HGB 12.0   < > 10.5* 10.2* 10.4* 10.2* 9.7*  HCT 38.3   < > 31.0* 30.0* 32.9* 32.9* 31.0*  MCV 88.7  --   --   --  88.2 90.1 89.3  PLT 168  --   --   --  166 151 160   < > = values in this interval not displayed.    Basic Metabolic Panel: Recent Labs  Lab 07/11/18 1832 07/11/18 2137  07/12/18 0450  07/12/18 [id number] 07/12/18 1241 07/13/18 0525 07/14/18 0447 07/15/18 0204  NA 133*  --    < > 132*   < > 132* 133* 140 138 140  K 3.0*  --    < > 2.8*   < > 2.2* 3.0* 4.4 4.1 3.6  CL 95*  --    < > 99   < > 99 102 111 113* 113*  CO2 18*  --    < >  19*  --   --  21* 17* 18* 20*  GLUCOSE 147*  --    < > 134*   < > 154* 137* 89 94 100*  BUN 23  --    < > 15   < > 12 11 8  5* 5*  CREATININE 1.54*  --    < > 1.04*   < > 0.80 0.98 0.79 0.68 0.69  CALCIUM 8.8*  --    < > 7.6*  --   --  7.7* 8.0* 8.1* 7.9*  MG 1.6*  --   --   --   --   --  1.8 1.8  --   --   PHOS  --  2.8  --   --   --   --   --   --   --   --    < > = values in this interval not displayed.    GFR: Estimated Creatinine Clearance: 46 mL/min (by C-G formula based on SCr of 0.69 mg/dL).  Liver Function Tests: Recent Labs  Lab 07/11/18 1832  AST 20  ALT 17  ALKPHOS 51  BILITOT 1.6*  PROT 7.7  ALBUMIN 3.1*    Recent Labs  Lab 07/11/18 1832  LIPASE 25   Coagulation Profile: Recent Labs  Lab 07/11/18 2137  INR 1.47    CBG: Recent Labs  Lab 07/14/18 0802  07/14/18 1158 07/14/18 1629 07/14/18 2215 07/15/18 0744  GLUCAP 80 99 165* 104* 89     Recent Results (from the past 240 hour(s))  Culture, blood (x 2)     Status: None (Preliminary result)   Collection Time: 07/12/18  2:40 AM  Result Value Ref Range Status   Specimen Description BLOOD RIGHT WRIST  Final   Special Requests   Final    BOTTLES DRAWN AEROBIC ONLY Blood Culture results may not be optimal due to an inadequate volume of blood received in culture bottles   Culture   Final    NO GROWTH 2 DAYS Performed at [facility], [address], [place], [place] [address]    Report Status PENDING  Incomplete  Culture, blood (x 2)     Status: None (Preliminary result)   Collection Time: 07/12/18  2:53 AM  Result Value Ref Range Status   Specimen Description BLOOD RIGHT HAND  Final   Special Requests   Final    BOTTLES DRAWN AEROBIC AND ANAEROBIC Blood Culture results may not be optimal due to an inadequate volume of blood received in culture bottles   Culture   Final    NO GROWTH 2 DAYS Performed at [facility], [address], [place], [place] [address]    Report Status PENDING  Incomplete  C difficile quick scan w PCR reflex     Status: Abnormal   Collection Time: 07/12/18  4:58 AM  Result Value Ref Range Status   C Diff antigen POSITIVE (A) NEGATIVE Final   C Diff toxin POSITIVE (A) NEGATIVE Final   C Diff interpretation Toxin producing C. difficile detected.  Final    Comment: CRITICAL RESULT CALLED TO, READ BACK BY AND VERIFIED WITHDeniece Ree RN [id number] 07/12/18 Performed at [facility], [address] [place], [place] [address]   Gastrointestinal Panel by PCR , Stool     Status: None   Collection Time: 07/12/18  4:58 AM  Result Value Ref Range Status   Campylobacter species NOT DETECTED NOT DETECTED Final  Plesimonas shigelloides NOT DETECTED NOT DETECTED Final   Salmonella species NOT DETECTED NOT DETECTED Final   Yersinia enterocolitica NOT  DETECTED NOT DETECTED Final   Vibrio species NOT DETECTED NOT DETECTED Final   Vibrio cholerae NOT DETECTED NOT DETECTED Final   Enteroaggregative E coli (EAEC) NOT DETECTED NOT DETECTED Final   Enteropathogenic E coli (EPEC) NOT DETECTED NOT DETECTED Final   Enterotoxigenic E coli (ETEC) NOT DETECTED NOT DETECTED Final   Shiga like toxin producing E coli (STEC) NOT DETECTED NOT DETECTED Final   Shigella/Enteroinvasive E coli (EIEC) NOT DETECTED NOT DETECTED Final   Cryptosporidium NOT DETECTED NOT DETECTED Final   Cyclospora cayetanensis NOT DETECTED NOT DETECTED Final   Entamoeba histolytica NOT DETECTED NOT DETECTED Final   Giardia lamblia NOT DETECTED NOT DETECTED Final   Adenovirus F40/41 NOT DETECTED NOT DETECTED Final   Astrovirus NOT DETECTED NOT DETECTED Final   Norovirus GI/GII NOT DETECTED NOT DETECTED Final   Rotavirus A NOT DETECTED NOT DETECTED Final   Sapovirus (I, II, IV, and V) NOT DETECTED NOT DETECTED Final    Comment: Performed at [facility], [address] [place], [place] [address]  MRSA PCR Screening     Status: None   Collection Time: 07/13/18 12:19 AM  Result Value Ref Range Status   MRSA by PCR NEGATIVE NEGATIVE Final    Comment:        The GeneXpert MRSA Assay (FDA approved for NASAL specimens only), is one component of a comprehensive MRSA colonization surveillance program. It is not intended to diagnose MRSA infection nor to guide or monitor treatment for MRSA infections. Performed at [facility], [address] [place], [place] [address]       Radiology Studies: No results found.   Medications:  Scheduled: . apixaban  2.5 mg Oral BID  . ezetimibe  10 mg Oral Daily  . feeding supplement (GLUCERNA SHAKE)  237 mL Oral TID BM  . insulin aspart  0-15 Units Subcutaneous TID WC  . loratadine  10 mg Oral Daily  . metoprolol tartrate  12.5 mg Oral BID  . simvastatin  40 mg Oral Daily  . vancomycin  125 mg Oral QID    Continuous: . sodium chloride 50 mL/hr at 07/13/18 1833   PRN:  Assessment/Plan:    Questionable diabetic ketoacidosis in the setting of diabetes mellitus type 2 No ketones were noted in the urine.  Her beta hydroxy butyrate acid was elevated.  Anion gap was noted to be elevated.  Patient was placed on IV insulin.  Her anion gap closed.  Bicarbonate levels improved.  She was transitioned to Lantus.  HbA1c 6.7.  Unsure if this was DKA.  CBGs have been on the lower side so Lantus was discontinued.  Continue just SSI for now.     C. 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 difficile in June.  She has recurrence of her C. difficile in July.  She completed 2 different courses of oral vancomycin for 2 weeks duration.  She appears to be having another recurrence considering her diarrhea.  She had improved with the previous courses of vancomycin.  Nursing staff reports that her stool output has improved.  Patient feels better this morning.  In view of this we will continue vancomycin for now. She will need a prolonged course with taper.  This has been ordered.  If patient does not improve over the next 24 hours as anticipated will need  to discuss with ID to consider Dificid.  No evidence for GI bleed at this time.  CT scan done on 8/8 showed mild colitis without any evidence for ileus.  Acute kidney injury with severe hypokalemia Renal function has improved with hydration.  This was likely prerenal due to her diarrhea.  Potassium level has improved as well.  Magnesium normal when last checked.  Continue to monitor periodically.   Cut back on IV fluids.  History of atrial fibrillation Patient is noted to be anticoagulated with apixaban which will be continued.  Hyperlipidemia Continue statin.  Chronic systolic CHF EF known to be 40 to 45%.  No evidence for CHF exacerbation.  She was actually somewhat hypovolemic.  Watch volume status closely.  Essential  hypertension Blood pressure is reasonably well controlled.  She is noted to be on low-dose metoprolol.   DVT Prophylaxis: On apixaban    Code Status: Full code Family Communication: Discussed with the patient.  No family at bedside Disposition Plan: Management as outlined above.  Patient seen by PT and OT.  Declines skilled nursing facility placement as of now.  Continue to mobilize.    LOS: 2 days   Osvaldo Shipper  [facility] Pager [phone] 07/15/2018, 11:57 AM  If 7PM-7AM, please contact night-coverage at [email or web address], password [email or web address]

## 2018-07-16 DIAGNOSIS — A0472 Enterocolitis due to Clostridium difficile, not specified as recurrent: Secondary | ICD-10-CM

## 2018-07-16 DIAGNOSIS — A09 Infectious gastroenteritis and colitis, unspecified: Secondary | ICD-10-CM

## 2018-07-16 DIAGNOSIS — E119 Type 2 diabetes mellitus without complications: Secondary | ICD-10-CM

## 2018-07-16 DIAGNOSIS — E440 Moderate protein-calorie malnutrition: Secondary | ICD-10-CM

## 2018-07-16 DIAGNOSIS — E1110 Type 2 diabetes mellitus with ketoacidosis without coma: Secondary | ICD-10-CM

## 2018-07-16 DIAGNOSIS — N179 Acute kidney failure, unspecified: Secondary | ICD-10-CM

## 2018-07-16 LAB — CBC
HEMATOCRIT: 30.9 % — AB (ref 36.0–46.0)
HEMOGLOBIN: 9.6 g/dL — AB (ref 12.0–15.0)
MCH: 27.6 pg (ref 26.0–34.0)
MCHC: 31.1 g/dL (ref 30.0–36.0)
MCV: 88.8 fL (ref 78.0–100.0)
Platelets: 176 10*3/uL (ref 150–400)
RBC: 3.48 MIL/uL — AB (ref 3.87–5.11)
RDW: 15.6 % — ABNORMAL HIGH (ref 11.5–15.5)
WBC: 8.7 10*3/uL (ref 4.0–10.5)

## 2018-07-16 LAB — GLUCOSE, CAPILLARY
GLUCOSE-CAPILLARY: 120 mg/dL — AB (ref 70–99)
GLUCOSE-CAPILLARY: 173 mg/dL — AB (ref 70–99)
Glucose-Capillary: 82 mg/dL (ref 70–99)
Glucose-Capillary: 154 mg/dL — ABNORMAL HIGH (ref 70–99)

## 2018-07-16 LAB — BASIC METABOLIC PANEL
Anion gap: 8 (ref 5–15)
BUN: 7 mg/dL — ABNORMAL LOW (ref 8–23)
CHLORIDE: 108 mmol/L (ref 98–111)
CO2: 24 mmol/L (ref 22–32)
CREATININE: 0.65 mg/dL (ref 0.44–1.00)
Calcium: 7.9 mg/dL — ABNORMAL LOW (ref 8.9–10.3)
GFR calc non Af Amer: >60 mL/min (ref >60)
Glucose, Bld: 129 mg/dL — ABNORMAL HIGH (ref 70–99)
POTASSIUM: 3.3 mmol/L — AB (ref 3.5–5.1)
SODIUM: 140 mmol/L (ref 135–145)

## 2018-07-16 LAB — MAGNESIUM: Magnesium: 1.2 mg/dL — ABNORMAL LOW (ref 1.7–2.4)

## 2018-07-16 MED ORDER — DEXTROSE 5 % IV SOLN
3.0000 g | Freq: Once | INTRAVENOUS | Status: AC
  Administered 2018-07-16: 3 g via INTRAVENOUS
  Filled 2018-07-16: qty 6

## 2018-07-16 MED ORDER — POTASSIUM CHLORIDE CRYS ER 20 MEQ PO TBCR
30.0000 meq | EXTENDED_RELEASE_TABLET | ORAL | Status: AC
  Administered 2018-07-16 (×2): 30 meq via ORAL
  Filled 2018-07-16 (×2): qty 1

## 2018-07-16 NOTE — Progress Notes (Signed)
Physical Therapy Treatment Patient Details Name: Chelsea RabonOdessa Candella MRN: [id number] DOB: 03-30-1933 Today's Date: 07/16/2018    History of Present Illness Patient is an 84 y/o female presenting to the ED on 07/11/18 with dizziness and abnormal stool. Noted positive for C.Diff. PMH significant for C. Difficile, atrial fibrillation, CHF, CAD, HLD , DM 2, HTN.    PT Comments    Patient is making progress toward PT goals and tolerated increased activity this session. Pt overall requires supervision/min guard for OOB mobility with use of RW. Continue to progress as tolerated.    Follow Up Recommendations  SNF     Equipment Recommendations  None recommended by PT    Recommendations for Other Services       Precautions / Restrictions Precautions Precautions: Fall    Mobility  Bed Mobility Overal bed mobility: Needs Assistance Bed Mobility: Supine to Sit;Sit to Supine     Supine to sit: Supervision Sit to supine: Supervision   General bed mobility comments: supervision for safety  Transfers Overall transfer level: Needs assistance Equipment used: Rolling walker (2 wheeled) Transfers: Sit to/from Stand Sit to Stand: Min guard;Supervision         General transfer comment: for safety from EOB and commode with use of grab bar  Ambulation/Gait Ambulation/Gait assistance: Supervision Gait Distance (Feet): 100 Feet Assistive device: Rolling walker (2 wheeled) Gait Pattern/deviations: Step-through pattern;Decreased stride length;Trunk flexed     General Gait Details: cues for posture; slow, steady gait   Stairs             Wheelchair Mobility    Modified Rankin (Stroke Patients Only)       Balance Overall balance assessment: Needs assistance Sitting-balance support: No upper extremity supported;Feet unsupported Sitting balance-Leahy Scale: Good     Standing balance support: Single extremity supported Standing balance-Leahy Scale: Poor Standing balance  comment: pt able to stand and perform pericare and hand hygiene without UE support                            Cognition Arousal/Alertness: Awake/alert Behavior During Therapy: WFL for tasks assessed/performed Overall Cognitive Status: Within Functional Limits for tasks assessed                                 General Comments: slow to respond to questions and sometimes not answering at all; flat affect      Exercises      General Comments        Pertinent Vitals/Pain Pain Assessment: No/denies pain    Home Living                      Prior Function            PT Goals (current goals can now be found in the care plan section) Acute Rehab PT Goals Patient Stated Goal: to go home PT Goal Formulation: With patient Time For Goal Achievement: 07/26/18 Potential to Achieve Goals: Good Progress towards PT goals: Progressing toward goals    Frequency    Min 3X/week      PT Plan Current plan remains appropriate    Co-evaluation              AM-PAC PT "6 Clicks" Daily Activity  Outcome Measure  Difficulty turning over in bed (including adjusting bedclothes, sheets and blankets)?: A Lot Difficulty moving from lying on  back to sitting on the side of the bed? : A Lot Difficulty sitting down on and standing up from a chair with arms (e.g., wheelchair, bedside commode, etc,.)?: A Lot Help needed moving to and from a bed to chair (including a wheelchair)?: A Little Help needed walking in hospital room?: A Little Help needed climbing 3-5 steps with a railing? : A Lot 6 Click Score: 14    End of Session Equipment Utilized During Treatment: Gait belt Activity Tolerance: Patient tolerated treatment well Patient left: in bed;with call bell/phone within reach Nurse Communication: Mobility status PT Visit Diagnosis: Unsteadiness on feet (R26.81);Other abnormalities of gait and mobility (R26.89);Muscle weakness (generalized) (M62.81)      Time: [id number] PT Time Calculation (min) (ACUTE ONLY): 35 min  Charges:  $Gait Training: 8-22 mins $Therapeutic Activity: 8-22 mins                     Erline LevineKellyn Tena Linebaugh, PTA Pager: [phone]     Carolynne EdouardKellyn R Alysah Carton 07/16/2018, 4:19 PM

## 2018-07-16 NOTE — Progress Notes (Signed)
[facility] PROGRESS NOTE  Chelsea Ward ZHY:[id number] DOB: [birth year] DOA: 07/11/2018  PCP: Deeann Saint, MD  Brief History/Interval Summary: 84 year old African-American female with a past medical history significant for recent hospitalization for C. difficile colitis, atrial fibrillation, chronic systolic CHF, coronary artery disease, diabetes mellitus type 2, essential hypertension presented with dark stools at home.  Also generalized weakness.  Patient recently completed a 2-week course of oral vancomycin.  She tells me that her diarrhea had resolved.  Then she started again having loose stools few days ago.  Seen by her PCPs office, underwent some testing and medications were adjusted.  She presented to the emergency department due to persistent symptoms.  Patient was noted to have acute renal failure with hypokalemia, some concern for DKA due to increased anion gap.  Stool was again positive for C. difficile.  She was started on an insulin infusion and was hospitalized.  She was transitioned to subcutaneous insulin.  Diarrhea persists.  Reason for Visit: Recurrent C. difficile colitis  Consultants: None  Procedures: None  Antibiotics: Oral vancomycin  Subjective/Interval History: Diarrhea contineus to improve.  Pt states one stool so far today , it is 5 pm.    ROS: Denies any headaches  Objective:  Vital Signs  Vitals:   07/15/18 1959 07/16/18 0019 07/16/18 0400 07/16/18 0856  BP: 137/90 (!) 146/97 (!) 160/74 135/76  Pulse: 70 70 64 80  Resp: 15 20 12 10   Temp: 98.4 F (36.9 C) 98.7 F (37.1 C) 98.3 F (36.8 C) 98 F (36.7 C)  TempSrc: Oral Oral Oral Oral  SpO2: 100% 100% 100% 100%  Weight:      Height:        Intake/Output Summary (Last 24 hours) at 07/16/2018 1718 Last data filed at 07/16/2018 1553 Gross per 24 hour  Intake 2759.41 ml  Output 2500 ml  Net 259.41 ml   Filed Weights   07/11/18 1823  Weight: 59 kg   Exam General appearance: Awake  alert.  In no distress. Resp: Clear to auscultation bilaterally.  No wheezing rales or rhonchi Cardio: S1-S2 is normal regular.  No S3-S4.  No rubs murmurs or bruit GI: Abdomen is soft.  Nontender nondistended.  No masses organomegaly  Extremities: No pedal edema Neurologic: No focal neurological deficits   Assessment/Plan:  C. 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 difficile in June.  She has recurrence of her C. difficile in July.  She completed 2 different courses of oral vancomycin for 2 weeks duration.  She presents now w/ recurrent diarrhea on 8/7 - admitted and started on po liquid vancomycin - diarrhea continued until the last 48 hrs has started to resolve - given her hx of recurrence, she will need a prolonged course with taper.  This has been ordered - CT scan done on 8/8 showed mild colitis without any evidence for ileus - WBC down from 13k to 8k   Possible DKA/ in setting of DM2 No ketones were noted in the urine.  Her beta hydroxy butyrate acid was elevated.  Anion gap was noted to be elevated.  Patient was placed on IV insulin.  Her anion gap closed.  Bicarbonate levels improved.  She was transitioned to Lantus.  HbA1c 6.7.  Unsure if this was DKA. - CBGs were on the lower side so Lantus was discontinued - continue just SSI for now.       Acute kidney injury with severe hypokalemia - creat 0.90 >> 0.  60 - potassium 2.5 >> 4.1, down to 3.3 today, pharm has reordered KCl po - magnesium normal when last checked  History of atrial fibrillation Patient is noted to be anticoagulated with apixaban which will be continued.  Hyperlipidemia Continue statin.  Chronic systolic CHF EF known to be 40 to 45%.  No evidence for CHF exacerbation.  She was actually somewhat hypovolemic.  Watch volume status closely.  Essential hypertension Blood pressure is reasonably well controlled.  She is noted to be on low-dose metoprolol.  Disposition: - seen  by PT who recommends SNF placement for rehab - patient declines skilled nursing facility placement as of now - continue to mobilize, hopefully can go home in 1- 2 day  Vinson Moselleob Annahi Short MD [facility] pgr [phone] 04/29/2018, 9:25 AM     DVT Prophylaxis: On apixaban    Code Status: Full code Family Communication: Discussed with the patient.  No family at bedside Disposition Plan: as above  Lab Results:  Data Reviewed: I have personally reviewed following labs and imaging studies  CBC: Recent Labs  Lab 07/11/18 1832  07/12/18 0808 07/13/18 0525 07/14/18 0447 07/15/18 0204 07/16/18 0436  WBC 13.2*  --   --  13.5* 10.5 9.8 8.7  HGB 12.0   < > 10.2* 10.4* 10.2* 9.7* 9.6*  HCT 38.3   < > 30.0* 32.9* 32.9* 31.0* 30.9*  MCV 88.7  --   --  88.2 90.1 89.3 88.8  PLT 168  --   --  166 151 160 176   < > = values in this interval not displayed.    Basic Metabolic Panel: Recent Labs  Lab 07/11/18 1832 07/11/18 2137  07/12/18 1241 07/13/18 0525 07/14/18 0447 07/15/18 0204 07/16/18 0436  NA 133*  --    < > 133* 140 138 140 140  K 3.0*  --    < > 3.0* 4.4 4.1 3.6 3.3*  CL 95*  --    < > 102 111 113* 113* 108  CO2 18*  --    < > 21* 17* 18* 20* 24  GLUCOSE 147*  --    < > 137* 89 94 100* 129*  BUN 23  --    < > 11 8 5* 5* 7*  CREATININE 1.54*  --    < > 0.98 0.79 0.68 0.69 0.65  CALCIUM 8.8*  --    < > 7.7* 8.0* 8.1* 7.9* 7.9*  MG 1.6*  --   --  1.8 1.8  --   --  1.2*  PHOS  --  2.8  --   --   --   --   --   --    < > = values in this interval not displayed.    GFR: Estimated Creatinine Clearance: 46 mL/min (by C-G formula based on SCr of 0.65 mg/dL).  Liver Function Tests: Recent Labs  Lab 07/11/18 1832  AST 20  ALT 17  ALKPHOS 51  BILITOT 1.6*  PROT 7.7  ALBUMIN 3.1*    Recent Labs  Lab 07/11/18 1832  LIPASE 25   Coagulation Profile: Recent Labs  Lab 07/11/18 2137  INR 1.47    CBG: Recent Labs  Lab 07/15/18 1801 07/15/18 2153  07/16/18 0734 07/16/18 1213 07/16/18 1647  GLUCAP 115* 140* 120* 173* 82     Recent Results (from the past 240 hour(s))  Culture, blood (x 2)     Status: None (Preliminary result)   Collection Time: 07/12/18  2:40  AM  Result Value Ref Range Status   Specimen Description BLOOD RIGHT WRIST  Final   Special Requests   Final    BOTTLES DRAWN AEROBIC ONLY Blood Culture results may not be optimal due to an inadequate volume of blood received in culture bottles   Culture   Final    NO GROWTH 4 DAYS Performed at [facility], [address], [place], [place] [address]    Report Status PENDING  Incomplete  Culture, blood (x 2)     Status: None (Preliminary result)   Collection Time: 07/12/18  2:53 AM  Result Value Ref Range Status   Specimen Description BLOOD RIGHT HAND  Final   Special Requests   Final    BOTTLES DRAWN AEROBIC AND ANAEROBIC Blood Culture results may not be optimal due to an inadequate volume of blood received in culture bottles   Culture   Final    NO GROWTH 4 DAYS Performed at [facility], [address], [place], [place] [address]    Report Status PENDING  Incomplete  C difficile quick scan w PCR reflex     Status: Abnormal   Collection Time: 07/12/18  4:58 AM  Result Value Ref Range Status   C Diff antigen POSITIVE (A) NEGATIVE Final   C Diff toxin POSITIVE (A) NEGATIVE Final   C Diff interpretation Toxin producing C. difficile detected.  Final    Comment: CRITICAL RESULT CALLED TO, READ BACK BY AND VERIFIED WITHDeniece Ree RN [id number] 07/12/18 Performed at [facility], [address] [place], [place] [address]   Gastrointestinal Panel by PCR , Stool     Status: None   Collection Time: 07/12/18  4:58 AM  Result Value Ref Range Status   Campylobacter species NOT DETECTED NOT DETECTED Final   Plesimonas shigelloides NOT DETECTED NOT DETECTED Final   Salmonella species NOT DETECTED NOT DETECTED Final   Yersinia enterocolitica NOT DETECTED NOT  DETECTED Final   Vibrio species NOT DETECTED NOT DETECTED Final   Vibrio cholerae NOT DETECTED NOT DETECTED Final   Enteroaggregative E coli (EAEC) NOT DETECTED NOT DETECTED Final   Enteropathogenic E coli (EPEC) NOT DETECTED NOT DETECTED Final   Enterotoxigenic E coli (ETEC) NOT DETECTED NOT DETECTED Final   Shiga like toxin producing E coli (STEC) NOT DETECTED NOT DETECTED Final   Shigella/Enteroinvasive E coli (EIEC) NOT DETECTED NOT DETECTED Final   Cryptosporidium NOT DETECTED NOT DETECTED Final   Cyclospora cayetanensis NOT DETECTED NOT DETECTED Final   Entamoeba histolytica NOT DETECTED NOT DETECTED Final   Giardia lamblia NOT DETECTED NOT DETECTED Final   Adenovirus F40/41 NOT DETECTED NOT DETECTED Final   Astrovirus NOT DETECTED NOT DETECTED Final   Norovirus GI/GII NOT DETECTED NOT DETECTED Final   Rotavirus A NOT DETECTED NOT DETECTED Final   Sapovirus (I, II, IV, and V) NOT DETECTED NOT DETECTED Final    Comment: Performed at [facility], [address] [place], [place] [address]  MRSA PCR Screening     Status: None   Collection Time: 07/13/18 12:19 AM  Result Value Ref Range Status   MRSA by PCR NEGATIVE NEGATIVE Final    Comment:        The GeneXpert MRSA Assay (FDA approved for NASAL specimens only), is one component of a comprehensive MRSA colonization surveillance program. It is not intended to diagnose MRSA infection nor to guide or monitor treatment for MRSA infections. Performed at [facility],  [address] [place], [place] [address]       Radiology Studies: No results found.   Medications:  Scheduled: . apixaban  2.5 mg Oral BID  . ezetimibe  10 mg Oral Daily  . feeding supplement (GLUCERNA SHAKE)  237 mL Oral TID BM  . insulin aspart  0-15 Units Subcutaneous TID WC  . loratadine  10 mg Oral Daily  . metoprolol tartrate  12.5 mg Oral BID  . simvastatin  40 mg Oral Daily  . vancomycin  125 mg Oral QID   Followed by  .  [START ON 07/29/2018] vancomycin  125 mg Oral BID   Followed by  . [START ON 08/06/2018] vancomycin  125 mg Oral Daily   Followed by  . [START ON 08/13/2018] vancomycin  125 mg Oral QODAY   Followed by  . [START ON 09/10/2018] vancomycin  125 mg Oral Q3 days   Continuous: . sodium chloride 30 mL/hr at 07/16/18 1553   PRN:     LOS: 3 days   If 7PM-7AM, please contact night-coverage at [email or web address], password [email or web address]

## 2018-07-17 DIAGNOSIS — A0471 Enterocolitis due to Clostridium difficile, recurrent: Principal | ICD-10-CM

## 2018-07-17 LAB — CULTURE, BLOOD (ROUTINE X 2)
Culture: NO GROWTH
Culture: NO GROWTH

## 2018-07-17 LAB — MAGNESIUM: MAGNESIUM: 1.8 mg/dL (ref 1.7–2.4)

## 2018-07-17 LAB — BASIC METABOLIC PANEL
ANION GAP: 7 (ref 5–15)
BUN: 7 mg/dL — ABNORMAL LOW (ref 8–23)
CO2: 26 mmol/L (ref 22–32)
Calcium: 8.7 mg/dL — ABNORMAL LOW (ref 8.9–10.3)
Chloride: 105 mmol/L (ref 98–111)
Creatinine, Ser: 0.72 mg/dL (ref 0.44–1.00)
Glucose, Bld: 136 mg/dL — ABNORMAL HIGH (ref 70–99)
POTASSIUM: 4.5 mmol/L (ref 3.5–5.1)
SODIUM: 138 mmol/L (ref 135–145)

## 2018-07-17 LAB — GLUCOSE, CAPILLARY
GLUCOSE-CAPILLARY: 132 mg/dL — AB (ref 70–99)
Glucose-Capillary: 126 mg/dL — ABNORMAL HIGH (ref 70–99)

## 2018-07-17 MED ORDER — VANCOMYCIN 50 MG/ML ORAL SOLUTION: 125.0000 mg | Freq: Four times a day (QID) | ORAL | 0 refills | Status: DC

## 2018-07-17 MED ORDER — VANCOMYCIN 50 MG/ML ORAL SOLUTION: 125.0000 mg | Freq: Every day | ORAL | 0 refills | Status: DC

## 2018-07-17 MED ORDER — VANCOMYCIN 50 MG/ML ORAL SOLUTION: 125.0000 mg | ORAL | 0 refills | Status: DC

## 2018-07-17 MED ORDER — VANCOMYCIN 50 MG/ML ORAL SOLUTION: 125.0000 mg | ORAL | 0 refills | Status: AC

## 2018-07-17 MED ORDER — VANCOMYCIN 50 MG/ML ORAL SOLUTION: 125.0000 mg | Freq: Two times a day (BID) | ORAL | 0 refills | Status: DC

## 2018-07-17 MED FILL — VANCOMYCIN HCL 125 MG CAP: 125 | 12 days supply | Qty: 48 | Fill #0

## 2018-07-17 NOTE — Progress Notes (Addendum)
Physical Therapy Treatment Patient Details Name: Chelsea Ward MRN: [id number] DOB: 01-Feb-1933 Today's Date: 07/17/2018    History of Present Illness Patient is an 84 y/o female presenting to the ED on 07/11/18 with dizziness and abnormal stool. Noted positive for C.Diff. PMH significant for C. Difficile, atrial fibrillation, CHF, CAD, HLD , DM 2, HTN.    PT Comments    Patient continues to make progress toward PT goals and overall supervision for safety this session. Pt able to ambulate 1950ft with RW. Pt does continue to present with generalized weakness and will continue to benefit from further skilled PT services.     Follow Up Recommendations  HHPT     Equipment Recommendations  None recommended by PT    Recommendations for Other Services       Precautions / Restrictions Precautions Precautions: Fall    Mobility  Bed Mobility Overal bed mobility: Needs Assistance Bed Mobility: Supine to Sit;Sit to Supine     Supine to sit: Supervision Sit to supine: Supervision   General bed mobility comments: supervision for safety  Transfers Overall transfer level: Needs assistance Equipment used: Rolling walker (2 wheeled) Transfers: Sit to/from Stand Sit to Stand: Supervision         General transfer comment: for safety  Ambulation/Gait Ambulation/Gait assistance: Supervision Gait Distance (Feet): 150 Feet Assistive device: Rolling walker (2 wheeled) Gait Pattern/deviations: Step-through pattern;Decreased stride length;Trunk flexed     General Gait Details: slow, steady gait   Stairs             Wheelchair Mobility    Modified Rankin (Stroke Patients Only)       Balance Overall balance assessment: Needs assistance Sitting-balance support: No upper extremity supported;Feet supported Sitting balance-Leahy Scale: Good     Standing balance support: Single extremity supported Standing balance-Leahy Scale: Poor                               Cognition Arousal/Alertness: Awake/alert Behavior During Therapy: WFL for tasks assessed/performed Overall Cognitive Status: Within Functional Limits for tasks assessed                                 General Comments: slow to respond to questions and flat affect      Exercises      General Comments        Pertinent Vitals/Pain Pain Assessment: Faces Faces Pain Scale: Hurts little more Pain Location: abdomen Pain Descriptors / Indicators: Aching Pain Intervention(s): Monitored during session    Home Living                      Prior Function            PT Goals (current goals can now be found in the care plan section) Acute Rehab PT Goals Patient Stated Goal: to go home PT Goal Formulation: With patient Time For Goal Achievement: 07/26/18 Potential to Achieve Goals: Good Progress towards PT goals: Progressing toward goals    Frequency    Min 3X/week      PT Plan Current plan remains appropriate    Co-evaluation              AM-PAC PT "6 Clicks" Daily Activity  Outcome Measure  Difficulty turning over in bed (including adjusting bedclothes, sheets and blankets)?: A Little Difficulty moving from lying on back to sitting  on the side of the bed? : A Little Difficulty sitting down on and standing up from a chair with arms (e.g., wheelchair, bedside commode, etc,.)?: A Little Help needed moving to and from a bed to chair (including a wheelchair)?: A Little Help needed walking in hospital room?: A Little Help needed climbing 3-5 steps with a railing? : A Lot 6 Click Score: 17    End of Session Equipment Utilized During Treatment: Gait belt Activity Tolerance: Patient tolerated treatment well Patient left: in bed;with call bell/phone within reach Nurse Communication: Mobility status PT Visit Diagnosis: Unsteadiness on feet (R26.81);Other abnormalities of gait and mobility (R26.89);Muscle weakness (generalized) (M62.81)      Time: [id number] PT Time Calculation (min) (ACUTE ONLY): 21 min  Charges:  $Gait Training: 8-22 mins                     Erline LevineKellyn Arham Symmonds, PTA Pager: [phone]     Chelsea Ward 07/17/2018, 3:35 PM

## 2018-07-17 NOTE — Plan of Care (Signed)
  Problem: Education: Goal: Knowledge of General Education information will improve Description Including pain rating scale, medication(s)/side effects and non-pharmacologic comfort measures Outcome: Adequate for Discharge   Problem: Health Behavior/Discharge Planning: Goal: Ability to manage health-related needs will improve Outcome: Adequate for Discharge   Problem: Clinical Measurements: Goal: Ability to maintain clinical measurements within normal limits will improve Outcome: Adequate for Discharge Goal: Will remain free from infection Outcome: Adequate for Discharge Goal: Diagnostic test results will improve Outcome: Adequate for Discharge Goal: Respiratory complications will improve Outcome: Adequate for Discharge Goal: Cardiovascular complication will be avoided Outcome: Adequate for Discharge   Problem: Activity: Goal: Risk for activity intolerance will decrease Outcome: Adequate for Discharge   Problem: Nutrition: Goal: Adequate nutrition will be maintained Outcome: Adequate for Discharge   Problem: Coping: Goal: Level of anxiety will decrease Outcome: Adequate for Discharge   Problem: Elimination: Goal: Will not experience complications related to bowel motility Outcome: Adequate for Discharge Goal: Will not experience complications related to urinary retention Outcome: Adequate for Discharge   Problem: Pain Managment: Goal: General experience of comfort will improve Outcome: Adequate for Discharge   Problem: Safety: Goal: Ability to remain free from injury will improve Outcome: Adequate for Discharge   Problem: Skin Integrity: Goal: Risk for impaired skin integrity will decrease Outcome: Adequate for Discharge   Problem: Spiritual Needs Goal: Ability to function at adequate level Outcome: Adequate for Discharge   

## 2018-07-17 NOTE — Progress Notes (Signed)
Occupational Therapy Treatment Patient Details Name: Chelsea RabonOdessa Ward MRN: [id number] DOB: Sep 25, 1933 Today's Date: 07/17/2018    History of present illness Patient is an 84 y/o female presenting to the ED on 07/11/18 with dizziness and abnormal stool. Noted positive for C.Diff. PMH significant for C. Difficile, atrial fibrillation, CHF, CAD, HLD , DM 2, HTN.   OT comments  Pt progressing towards acute OT goals. Focus of session was grooming tasks at sink. Pt also completed toilet transfer and pericare. Pt tolerated session fairly well, seated rest breaks incorporated. D/c plan remains appropriate.    Follow Up Recommendations  Home health OT;Supervision/Assistance - 24 hour    Equipment Recommendations  None recommended by OT    Recommendations for Other Services      Precautions / Restrictions Precautions Precautions: Fall Restrictions Weight Bearing Restrictions: No       Mobility Bed Mobility Overal bed mobility: Needs Assistance Bed Mobility: Supine to Sit;Sit to Supine     Supine to sit: Supervision;HOB elevated Sit to supine: Supervision   General bed mobility comments: supervision for safety  Transfers Overall transfer level: Needs assistance Equipment used: Rolling walker (2 wheeled) Transfers: Sit to/from [employer]Stand;Stand Pivot Transfers Sit to Stand: Min guard;Supervision Stand pivot transfers: Min guard       General transfer comment: for safety    Balance Overall balance assessment: Needs assistance Sitting-balance support: No upper extremity supported;Feet unsupported Sitting balance-Leahy Scale: Good     Standing balance support: Single extremity supported Standing balance-Leahy Scale: Poor                             ADL either performed or assessed with clinical judgement   ADL Overall ADL's : Needs assistance/impaired     Grooming: Set up;Sitting;Supervision/safety;Min guard;Wash/dry hands;Oral care;Standing Grooming Details  (indicate cue type and reason): stood to wash hands. seated rest break after prior to walking back to bed                 Toilet Transfer: Min guard;Stand-pivot;RW   Toileting- Clothing Manipulation and Hygiene: Moderate assistance;Sit to/from stand Toileting - Clothing Manipulation Details (indicate cue type and reason): min guard to stand, therapist completed pericare with pt standing with rw.     Functional mobility during ADLs: Min guard;Rolling walker General ADL Comments: [facility] Pericare completed then walked to sink for 2 grooming tasks and walked back to bed. Seated rest breaks incorporated.     Vision       Perception     Praxis      Cognition Arousal/Alertness: Awake/alert Behavior During Therapy: Flat affect;WFL for tasks assessed/performed Overall Cognitive Status: Within Functional Limits for tasks assessed                                 General Comments: slow to respond to questions and sometimes not answering at all; flat affect        Exercises     Shoulder Instructions       General Comments      Pertinent Vitals/ Pain       Pain Assessment: Faces Faces Pain Scale: Hurts little more Pain Location: abdomen Pain Descriptors / Indicators: Aching Pain Intervention(s): Monitored during session;Repositioned;Limited activity within patient's tolerance  Home Living  Prior Functioning/Environment              Frequency  Min 2X/week        Progress Toward Goals  OT Goals(current goals can now be found in the care plan section)  Progress towards OT goals: Progressing toward goals  Acute Rehab OT Goals Patient Stated Goal: to go home OT Goal Formulation: With patient Time For Goal Achievement: 07/27/18 Potential to Achieve Goals: Good ADL Goals Pt Will Perform Grooming: standing;with supervision Pt Will Perform Lower  Body Bathing: with supervision;with set-up;sit to/from stand Pt Will Perform Upper Body Dressing: sitting;with set-up Pt Will Perform Lower Body Dressing: with supervision;sit to/from stand Pt Will Transfer to Toilet: with supervision;ambulating;bedside commode Pt Will Perform Toileting - Clothing Manipulation and hygiene: with supervision;sit to/from stand Pt Will Perform Tub/Shower Transfer: with min guard assist;ambulating;shower seat;rolling walker;Tub transfer Pt/caregiver will Perform Home Exercise Program: Increased strength;Both right and left upper extremity;With Supervision  Plan Discharge plan remains appropriate    Co-evaluation                 AM-PAC PT "6 Clicks" Daily Activity     Outcome Measure   Help from another person eating meals?: None Help from another person taking care of personal grooming?: A Little Help from another person toileting, which includes using toliet, bedpan, or urinal?: A Lot Help from another person bathing (including washing, rinsing, drying)?: A Lot Help from another person to put on and taking off regular upper body clothing?: A Little Help from another person to put on and taking off regular lower body clothing?: A Lot 6 Click Score: 16    End of Session Equipment Utilized During Treatment: Rolling walker  OT Visit Diagnosis: Muscle weakness (generalized) (M62.81);Pain;Unsteadiness on feet (R26.81)   Activity Tolerance Patient tolerated treatment well;Patient limited by fatigue   Patient Left in bed;with call bell/phone within reach;with bed alarm set   Nurse Communication Other (comment)(bowel movement during session)        Time: 1048-1110 OT Time Calculation (min): 22 min  Charges: OT General Charges $OT Visit: 1 Visit OT Treatments $Self Care/Home Management : 8-22 mins     Pilar GrammesMathews, Johnika Escareno H 07/17/2018, 11:48 AM

## 2018-07-17 NOTE — Discharge Summary (Signed)
Physician Discharge Summary  Patient ID: Chelsea Ward MRN: [id number] DOB/AGE: Dec 20, 1932 84 y.o.  Admit date: 07/11/2018 Discharge date: 07/17/2018  Admission Diagnoses: Active Problems:   Diarrhea   HLD (hyperlipidemia)   C. difficile colitis   Controlled type 2 diabetes mellitus without complication, without long-term current use of insulin (HCC)   AKI (acute kidney injury) (HCC)   Diabetic ketoacidosis associated with type 2 diabetes mellitus (HCC)   DKA (diabetic ketoacidoses) (HCC)   Malnutrition of moderate degree  Discharge Diagnoses:    Recurrent Cdif colitis   Diarrhea   HLD (hyperlipidemia)   Controlled type 2 diabetes mellitus without complication, without long-term current use of insulin (HCC)   AKI (acute kidney injury) (HCC)   Diabetic ketoacidosis associated with type 2 diabetes mellitus (HCC)   Malnutrition of moderate degree  Discharged Condition: good  Hospital Course:  84 year old African-American female with a past medical history significant for recent hospitalization for C. difficile colitis, atrial fibrillation, chronic systolic CHF, coronary artery disease, diabetes mellitus type 2, essential hypertension presented with dark stools at home.  Also generalized weakness.  Patient recently completed a 2-week course of oral vancomycin.  She tells me that her diarrhea had resolved.  Then she started again having loose stools few days ago.  Seen by her PCPs office, underwent some testing and medications were adjusted.  She presented to the emergency department due to persistent symptoms.  Patient was noted to have acute renal failure with hypokalemia, some concern for DKA due to increased anion gap.  Stool was again positive for C. difficile.  She was started on an insulin infusion and was hospitalized.  She was transitioned to subcutaneous insulin.  Diarrhea persists.  Assessment/Plan:  C. 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  difficile in June.  She has recurrence of her C. difficile in July.  She completed 2 different courses of oral vancomycin for 2 weeks duration.  She presents now w/ recurrent diarrhea on 8/7 - stool was again + for cdif - admitted and started on po liquid vancomycin - diarrhea continued for a few days and has now tapered off, have formed stools x 48 hrs - given her hx of recurrence, she will need a prolonged course with taper over a few month.  This has been ordered - CT scan done on 8/8 showed mild colitis without any evidence for ileus - WBC down from 13k to 8k   Possible DKA/ in setting of DM2 No ketones were noted in the urine.  Her beta hydroxy butyrate acid was elevated.  Anion gap was noted to be elevated.  Patient was placed on IV insulin.  Her anion gap closed.  Bicarbonate levels improved.  She was transitioned to Lantus.  HbA1c 6.7.  Unsure if this was DKA. - CBGs were on the lower side so Lantus was discontinued - will just resume home Januvia at dc today    Acute kidney injury with severe hypokalemia - creat 0.90 >> 0. 60 - potassium 2.5 >> 4.1, down to 3.3 today, pharm has reordered KCl po - magnesium normal when last checked  History of atrial fibrillation Patient is noted to be anticoagulated with apixaban which was continued  Hyperlipidemia Continue statin.  Chronic systolic CHF EF known to be 40 to 45%.  No evidence for CHF exacerbation.  She was actually somewhat hypovolemic.  Watch volume status closely.  Essential hypertension Blood pressure is reasonably well controlled.  She is noted to be on  low-dose metoprolol.     Discharge Exam: Blood pressure (!) 158/113, pulse 69, temperature 98.3 F (36.8 C), temperature source Oral, resp. rate 16, height 5\' 4"  (1.626 m), weight 59 kg, SpO2 100 %. General appearance: Awake alert.  In no distress. Resp: Clear to auscultation bilaterally.  No wheezing rales or rhonchi Cardio: S1-S2 is normal regular.  No S3-S4.   No rubs murmurs or bruit GI: Abdomen is soft.  Nontender nondistended.  No masses organomegaly  Extremities: No pedal edema Neurologic: No focal neurological deficits  Disposition: Discharge disposition: 01-Home or Self Care        Allergies as of 07/17/2018   No Known Allergies     Medication List    STOP taking these medications   furosemide 20 MG tablet Commonly known as:  LASIX     TAKE these medications   acetaminophen 325 MG tablet Commonly known as:  TYLENOL Take 2 tablets (650 mg total) by mouth every 8 (eight) hours. What changed:    when to take this  reasons to take this   apixaban 2.5 MG Tabs tablet Commonly known as:  ELIQUIS Take 1 tablet (2.5 mg total) by mouth 2 (two) times daily.   aspirin EC 81 MG tablet Take 81 mg by mouth daily.   ezetimibe 10 MG tablet Commonly known as:  ZETIA Take 10 mg by mouth daily.   feeding supplement (GLUCERNA SHAKE) Liqd Take 237 mLs by mouth 2 (two) times daily between meals.   fexofenadine 60 MG tablet Commonly known as:  ALLEGRA Take 1 tablet (60 mg total) by mouth daily.   Fish Oil 500 MG Caps Take 1 capsule by mouth 2 (two) times daily.   fluticasone 50 MCG/ACT nasal spray Commonly known as:  FLONASE Place 1-2 sprays into both nostrils daily.   JANUVIA 100 MG tablet Generic drug:  sitaGLIPtin Take 100 mg by mouth daily.   metoprolol tartrate 25 MG tablet Commonly known as:  LOPRESSOR Take 0.5 tablets (12.5 mg total) by mouth 2 (two) times daily.   ondansetron 4 MG disintegrating tablet Commonly known as:  ZOFRAN-ODT Take 1 tablet (4 mg total) by mouth every 8 (eight) hours as needed for nausea or vomiting.   polyvinyl alcohol 1.4 % ophthalmic solution Commonly known as:  LIQUIFILM TEARS Place 1 drop into both eyes as needed for dry eyes. What changed:  when to take this   saccharomyces boulardii 250 MG capsule Commonly known as:  FLORASTOR Take 1 capsule (250 mg total) by mouth 2 (two)  times daily.   simvastatin 40 MG tablet Commonly known as:  ZOCOR Take 40 mg by mouth daily.   vancomycin 50 mg/mL  oral solution Commonly known as:  VANCOCIN Take 2.5 mLs (125 mg total) by mouth 4 (four) times daily.   vancomycin 50 mg/mL  oral solution Commonly known as:  VANCOCIN Take 2.5 mLs (125 mg total) by mouth 2 (two) times daily. Start taking on:  07/29/2018   vancomycin 50 mg/mL  oral solution Commonly known as:  VANCOCIN Take 2.5 mLs (125 mg total) by mouth daily. Start taking on:  08/06/2018   vancomycin 50 mg/mL  oral solution Commonly known as:  VANCOCIN Take 2.5 mLs (125 mg total) by mouth every other day. Start taking on:  08/13/2018   vancomycin 50 mg/mL  oral solution Commonly known as:  VANCOCIN Take 2.5 mLs (125 mg total) by mouth every 3 (three) days for 10 doses. Start taking on:  09/10/2018  Follow-up Information    [facility].   Specialty:  Home Health Services Why:  home health services arranged Contact information: [address] [place] [address] [phone]           Signed: Maree Krabbe 07/17/2018, 12:54 PM

## 2018-07-18 ENCOUNTER — Telehealth: Payer: Self-pay | Admitting: Family Medicine

## 2018-07-18 NOTE — Telephone Encounter (Unsigned)
Copied from CRM [phone]#[id number]. Topic: General - Other >> Jul 18, 2018 11:37 AM Percival SpanishKennedy, Cheryl W wrote:  [facility]Jerily [facility] req orders home health  PT 2 x 4   starting next week  home health aide 2 x 4  new certification   [phone]

## 2018-07-18 NOTE — Telephone Encounter (Signed)
Transition Care Management Follow-up Telephone Call  Physician Discharge Summary  Patient ID: Chelsea Ward MRN: [id number] DOB/AGE: 12-11-1932 84 y.o.  Admit date: 07/11/2018 Discharge date: 07/17/2018  Admission Diagnoses: Active Problems:   Diarrhea   HLD (hyperlipidemia)   C. difficile colitis   Controlled type 2 diabetes mellitus without complication, without long-term current use of insulin (HCC)   AKI (acute kidney injury) (HCC)   Diabetic ketoacidosis associated with type 2 diabetes mellitus (HCC)   DKA (diabetic ketoacidoses) (HCC)   Malnutrition of moderate degree  Discharge Diagnoses:    Recurrent Cdif colitis   Diarrhea   HLD (hyperlipidemia)   Controlled type 2 diabetes mellitus without complication, without long-term current use of insulin (HCC)   AKI (acute kidney injury) (HCC)   Diabetic ketoacidosis associated with type 2 diabetes mellitus (HCC)   Malnutrition of moderate degree  Discharged Condition: good  Hospital Course: 84 year old African-American female with a past medical history significant for recent hospitalization for C. difficile colitis, atrial fibrillation, chronic systolic CHF, coronary artery disease, diabetes mellitus type 2, essential hypertension presented with dark stools at home. Also generalized weakness. Patient recently completed a 2-week course of oral vancomycin. She tells me that her diarrhea had resolved. Then she started again having loose stools few days ago. Seen by her PCPs office, underwent some testing and medications were adjusted. She presented to the emergency department due to persistent symptoms. Patient was noted to have acute renal failure with hypokalemia, some concern for DKA due to increased anion gap. Stool was again positive for C. difficile. She was started on an insulin infusion and was hospitalized. She was transitioned to subcutaneous insulin. Diarrhea persists.    How have you been since you were  released from the hospital? "doing okay"   Do you understand why you were in the hospital? yes   Do you understand the discharge instructions? yes   Where were you discharged to? Home   Items Reviewed:  Medications reviewed: yes  Allergies reviewed: yes  Dietary changes reviewed: yes  Referrals reviewed: yes   Functional Questionnaire:   Activities of Daily Living (ADLs):   She states they are independent in the following: feeding States they require assistance with the following: ambulation, bathing and hygiene, continence, grooming, toileting and dressing   Any transportation issues/concerns?: no   Any patient concerns? no   Confirmed importance and date/time of follow-up visits scheduled no, pt has follow up with cardiology on 07/31/2018, then will schedule office visit with Dr. Salomon FickBanks if needed.   Provider Appointment booked with  Confirmed with patient if condition begins to worsen call PCP or go to the ER.  Patient was given the office number and encouraged to call back with question or concerns.  : yes

## 2018-07-19 NOTE — Telephone Encounter (Signed)
[facility] verbal orders for Child psychotherapistsocial worker. Please advise cb# [phone]

## 2018-07-20 NOTE — Telephone Encounter (Signed)
[facility] Arline [facility]indy [facility] left [facility] detailed message regarding approval for requested verbal orders for social worker for the pt

## 2018-07-23 NOTE — Telephone Encounter (Signed)
Jerilynn from [facility] called to follow up on previous  request for verbal orders for patient  please call  [phone]

## 2018-07-25 NOTE — Telephone Encounter (Signed)
Sp[facility] with Th[facility]Jerilly with [facility] voiced understanding that the verbal orders requested for pt were approved by Dr Salomon FickBanks, a voicemail was left on [employer]ys [employer] UsCindy voicemail

## 2018-08-01 ENCOUNTER — Encounter: Payer: Self-pay | Admitting: Physician Assistant

## 2018-08-01 ENCOUNTER — Ambulatory Visit (INDEPENDENT_AMBULATORY_CARE_PROVIDER_SITE_OTHER): Payer: Medicare Other | Admitting: Physician Assistant

## 2018-08-01 VITALS — BP 96/60 | HR 50 | Ht 64.0 in | Wt 130.2 lb

## 2018-08-01 DIAGNOSIS — I5022 Chronic systolic (congestive) heart failure: Secondary | ICD-10-CM | POA: Diagnosis not present

## 2018-08-01 DIAGNOSIS — E785 Hyperlipidemia, unspecified: Secondary | ICD-10-CM | POA: Diagnosis not present

## 2018-08-01 DIAGNOSIS — I959 Hypotension, unspecified: Secondary | ICD-10-CM | POA: Diagnosis not present

## 2018-08-01 DIAGNOSIS — A0472 Enterocolitis due to Clostridium difficile, not specified as recurrent: Secondary | ICD-10-CM

## 2018-08-01 DIAGNOSIS — I480 Paroxysmal atrial fibrillation: Secondary | ICD-10-CM | POA: Insufficient documentation

## 2018-08-01 MED ORDER — APIXABAN 2.5 MG PO TABS: 2.5000 mg | ORAL_TABLET | Freq: Two times a day (BID) | ORAL | 3 refills | Status: DC

## 2018-08-01 NOTE — Progress Notes (Signed)
Cardiology Office Note:    Date:  08/01/2018   ID:  Chelsea Ward, DOB 24-Aug-1933, MRN [id number]  PCP:  Deeann Saint, MD  Cardiologist:  Kristeen Miss, MD   Referring MD: Deeann Saint, MD   Chief Complaint  Patient presents with  . Hospitalization Follow-up    admx with AFib in setting of CDiff colitis    History of Present Illness:    Chelsea Ward is a 84 y.o. female with congestive heart failure, diabetes, hypertension, hyperlipidemia.  She was previously followed in [place] and recently moved to [place].  She was admitted in June 2019 with C. difficile colitis.  Of note, echocardiogram during that admission demonstrated EF 40-45%.  Previous EF was unknown.  She was then admitted to [facility] in July 2019 with atrial fibrillation in the setting of recurrent C. difficile colitis.  She was placed on anticoagulation due to significant thromboembolic risk factors.  She did have minimal troponin elevation.  She was followed by cardiology.  Troponin elevation was felt to be related to demand ischemia.  She did convert to normal sinus rhythm.  She was admitted again 8/7-8/13 with recurrent C Diff colitis complicated by possible DKA and AKI.    Chelsea Ward returns for follow up.  She is here with her son.  She is feeling better.  She notes less diarrhea. She does get out of breath with some activities.  She mainly feels dizzy.  She denies syncope.  She denies chest pain, paroxysmal nocturnal dyspnea, leg swelling.    Prior CV studies:   The following studies were reviewed today:  Echocardiogram 05/22/2018 Mild LVH, EF 40-45, global HK with incoordinate septal motion, trivial MR, trivial TR, PASP 25  Past Medical History:  Diagnosis Date  . Arthritis    "arms, knees" (06/12/2018)  . Atrial fibrillation with RVR (HCC) 06/12/2018  . CHF (congestive heart failure) (HCC)   . Heart disease   . Hypercholesteremia   . Hyperlipidemia   . Hypertension   . Type II diabetes mellitus  (HCC)    Surgical Hx: The patient  has a past surgical history that includes Colon surgery; Cataract extraction, bilateral (Bilateral); Vaginal hysterectomy; and Tubal ligation.   Current Medications: Current Meds  Medication Sig  . acetaminophen (TYLENOL) 325 MG tablet Take 2 tablets (650 mg total) by mouth every 8 (eight) hours.  Marland Kitchen ezetimibe (ZETIA) 10 MG tablet Take 10 mg by mouth daily.  . feeding supplement, GLUCERNA SHAKE, (GLUCERNA SHAKE) LIQD Take 237 mLs by mouth 2 (two) times daily between meals.  . fexofenadine (ALLEGRA) 60 MG tablet Take 1 tablet (60 mg total) by mouth daily.  . fluticasone (FLONASE) 50 MCG/ACT nasal spray Place 1-2 sprays into both nostrils daily.  Marland Kitchen JANUVIA 100 MG tablet Take 100 mg by mouth daily.  . Omega-3 Fatty Acids (FISH OIL) 500 MG CAPS Take 1 capsule by mouth 2 (two) times daily.  . ondansetron (ZOFRAN ODT) 4 MG disintegrating tablet Take 1 tablet (4 mg total) by mouth every 8 (eight) hours as needed for nausea or vomiting.  . polyvinyl alcohol (LIQUIFILM TEARS) 1.4 % ophthalmic solution Place 1 drop into both eyes as directed.  . saccharomyces boulardii (FLORASTOR) 250 MG capsule Take 1 capsule (250 mg total) by mouth 2 (two) times daily.  . simvastatin (ZOCOR) 40 MG tablet Take 40 mg by mouth daily.  . vancomycin (VANCOCIN) 125 MG capsule Take 1 capsule by mouth 3 (three) times daily. For 10 doses  .  vancomycin (VANCOCIN) 50 mg/mL oral solution Take 2.5 mLs (125 mg total) by mouth 4 (four) times daily.  . vancomycin (VANCOCIN) 50 mg/mL oral solution Take 2.5 mLs (125 mg total) by mouth 2 (two) times daily.  Melene Muller ON 08/06/2018] vancomycin (VANCOCIN) 50 mg/mL oral solution Take 2.5 mLs (125 mg total) by mouth daily.  Melene Muller ON 08/13/2018] vancomycin (VANCOCIN) 50 mg/mL oral solution Take 2.5 mLs (125 mg total) by mouth every other day.  Melene Muller ON 09/10/2018] vancomycin (VANCOCIN) 50 mg/mL oral solution Take 2.5 mLs (125 mg total) by mouth every 3  (three) days for 10 doses.  . [DISCONTINUED] apixaban (ELIQUIS) 2.5 MG TABS tablet Take 2.5 mg by mouth 2 (two) times daily. Per caregiver patient takes 1/2 tablet po two times daily  . [DISCONTINUED] aspirin EC 81 MG tablet Take 81 mg by mouth daily.  . [DISCONTINUED] metoprolol tartrate (LOPRESSOR) 25 MG tablet Take 0.5 tablets (12.5 mg total) by mouth 2 (two) times daily.     Allergies:   Patient has no known allergies.   Social History   Tobacco Use  . Smoking status: Never Smoker  . Smokeless tobacco: Never Used  Substance Use Topics  . Alcohol use: Never    Frequency: Never  . Drug use: Never     Family Hx: The patient's family history includes Arthritis in her father, mother, and sister; Diabetes Mellitus II in her son; Hearing loss in her mother; Heart attack in her father and sister.  ROS:   Please see the history of present illness.    Review of Systems  Constitution: Positive for chills.  Neurological: Positive for headaches.   All other systems reviewed and are negative.   EKGs/Labs/Other Test Reviewed:    EKG:  EKG is  ordered today.  The ekg ordered today demonstrates NSR, HR 70, LAD, TWI 1, aVL, QTc 442  Recent Labs: 06/12/2018: B Natriuretic Peptide 189.5 06/13/2018: TSH 1.606 07/11/2018: ALT 17 07/16/2018: Hemoglobin 9.6; Platelets 176 07/17/2018: BUN 7; Creatinine, Ser 0.72; Magnesium 1.8; Potassium 4.5; Sodium 138   Recent Lipid Panel Lab Results  Component Value Date/Time   CHOL 99 06/13/2018 05:21 AM   TRIG 63 06/13/2018 05:21 AM   HDL 33 (L) 06/13/2018 05:21 AM   CHOLHDL 3.0 06/13/2018 05:21 AM   LDLCALC 53 06/13/2018 05:21 AM    Physical Exam:    VS:  BP 96/60 Comment: been low a couple days per physical therapist  Pulse (!) 50   Ht 5\' 4"  (1.626 m)   Wt 130 lb 4 oz (59.1 kg)   BMI 22.36 kg/m     Wt Readings from Last 3 Encounters:  08/01/18 130 lb 4 oz (59.1 kg)  07/11/18 130 lb (59 kg)  07/09/18 130 lb (59 kg)     Physical Exam    Constitutional: She is oriented to person, place, and time. She appears well-developed and well-nourished. No distress.  HENT:  Head: Normocephalic and atraumatic.  Eyes: No scleral icterus.  Neck: No JVD present. No thyromegaly present.  Cardiovascular: Normal rate and regular rhythm.  No murmur heard. Pulmonary/Chest: Effort normal. She has no rales.  Abdominal: Soft.  Musculoskeletal: She exhibits no edema.  Lymphadenopathy:    She has no cervical adenopathy.  Neurological: She is alert and oriented to person, place, and time.  Skin: Skin is warm and dry.  Psychiatric: She has a normal mood and affect.    ASSESSMENT & PLAN:    Paroxysmal atrial fibrillation (HCC)  Maintaining normal sinus rhythm.  Her weight is less than 60 kg and her age is over [age].  Therefore, she should be on Apixaban 2.5 mg twice daily.  Her son has been cutting the 2.5 mg tablets in half.  I have asked him to go ahead and change her dose back to 2.5 mg twice daily.  She does not need aspirin and this can be discontinued.  Obtain follow-up BMET, CBC today.  Chronic systolic CHF (congestive heart failure) (HCC)  EF 40-45 by echocardiogram in June 2019.  We do not have records from her previous cardiologist.  [facility] (Dr[facility] Alric Ranheodore Frank with [facility]).  Her blood pressure cannot tolerate the addition of an ACE inhibitor.  As noted below, I am stopping her beta-blocker given symptomatic hypotension.  Hyperlipidemia, unspecified hyperlipidemia type Continue statin therapy.  Hypotension, unspecified hypotension type She does note significant dizziness at times.  I believe that she is symptomatic with hypotension.  This may be partly related to significant fluid loss from diarrhea recently.  Discontinue metoprolol.  Obtain BMET, CBC today.  C. difficile colitis  She has been admitted 3 times since June with recurrent C. difficile colitis.  She seems to be improving.  Continue  current management per primary care.  Dispo:  Return in about 3 months (around 11/01/2018) for Routine Follow Up, w/ Dr. Elease HashimotoNahser, or Tereso NewcomerScott Jovan Colligan, PA-C.   Medication Adjustments/Labs and Tests Ordered: Current medicines are reviewed at length with the patient today.  Concerns regarding medicines are outlined above.  Tests Ordered: Orders Placed This Encounter  Procedures  . Basic Metabolic Panel (BMET)  . CBC  . EKG 12-Lead   Medication Changes: Meds ordered this encounter  Medications  . apixaban (ELIQUIS) 2.5 MG TABS tablet    Sig: Take 1 tablet (2.5 mg total) by mouth 2 (two) times daily.    Dispense:  180 tablet    Refill:  3    Signed, Tereso NewcomerScott Ashlay Altieri, PA-C  08/01/2018 5:07 PM    [facility] [address], [place], [place]  [address] Phone: [phone]; Fax: [phone]

## 2018-08-01 NOTE — Patient Instructions (Addendum)
Medication Instructions:  1. STOP ASPIRIN 2. STOP METOPROLOL  3. MAKE SURE YOU ARE TAKING ELIQUIS 2.5 MG 1 WHOLE TABLET TWICE DAILY   Labwork: TODAY BMET, CBC  Testing/Procedures: NONE ORDERED TODAY  Follow-Up: 10/10/18 @ 11:20 WITH DR. Elease HashimotoNAHSER   Any Other Special Instructions Will Be Listed Below (If Applicable).  WE HAVE REQUESTED RECORDS FROM DR. THEODORE FRANK IN [place]  If you need a refill on your cardiac medications before your next appointment, please call your pharmacy.

## 2018-08-02 ENCOUNTER — Telehealth: Payer: Self-pay

## 2018-08-02 ENCOUNTER — Telehealth: Payer: Self-pay | Admitting: Family Medicine

## 2018-08-02 DIAGNOSIS — R7989 Other specified abnormal findings of blood chemistry: Secondary | ICD-10-CM

## 2018-08-02 LAB — BASIC METABOLIC PANEL
BUN/Creatinine Ratio: 17 (ref 12–28)
BUN: 18 mg/dL (ref 8–27)
CO2: 21 mmol/L (ref 20–29)
Calcium: 9.6 mg/dL (ref 8.7–10.3)
Chloride: 102 mmol/L (ref 96–106)
Creatinine, Ser: 1.08 mg/dL — ABNORMAL HIGH (ref 0.57–1.00)
GFR calc Af Amer: 55 mL/min/{1.73_m2} — ABNORMAL LOW (ref >59)
GFR calc non Af Amer: 48 mL/min/{1.73_m2} — ABNORMAL LOW (ref >59)
GLUCOSE: 88 mg/dL (ref 65–99)
Potassium: 4.9 mmol/L (ref 3.5–5.2)
SODIUM: 141 mmol/L (ref 134–144)

## 2018-08-02 LAB — CBC
HEMATOCRIT: 38.5 % (ref 34.0–46.6)
Hemoglobin: 12.2 g/dL (ref 11.1–15.9)
MCH: 27.9 pg (ref 26.6–33.0)
MCHC: 31.7 g/dL (ref 31.5–35.7)
MCV: 88 fL (ref 79–97)
PLATELETS: 321 10*3/uL (ref 150–450)
RBC: 4.38 x10E6/uL (ref 3.77–5.28)
RDW: 16.5 % — AB (ref 12.3–15.4)
WBC: 7.5 10*3/uL (ref 3.4–10.8)

## 2018-08-02 NOTE — Telephone Encounter (Signed)
-----   Message from Beatrice LecherScott T Weaver, [place]PA-C sent at 08/02/2018  8:21 AM EDT ----- The following abnormalities are noted:  Creatinine somewhat elevated since 07/17/18.   All other values are normal, stable or within acceptable limits. Medication changes / Follow up labs / Other changes or recommendations:    - Encourage oral fluid intake over the next 1-2 weeks (water)  - Repeat BMET 1 month  Tereso NewcomerScott Weaver, PA-C 08/02/2018 8:20 AM

## 2018-08-02 NOTE — Telephone Encounter (Signed)
Copied from CRM [phone]. Topic: Quick Communication - See Telephone Encounter >> Aug 02, 2018  4:46 PM Floria RavelingStovall, Shana A wrote: CRM for notification. See Telephone encounter for: 08/02/18.  Mary with [facility] [phone] Need verbals for  Home care visit/Nursing  1 week 1 1 week 3

## 2018-08-02 NOTE — Telephone Encounter (Signed)
Patient aware of lab results. Patient will increase oral fluid intake over the next couple of weeks and will come back in for lab work on 09/03/18.

## 2018-08-03 NOTE — Telephone Encounter (Signed)
Ok

## 2018-08-03 NOTE — Telephone Encounter (Signed)
I called Corrie DandyMary and gave verbal orders per Dr Salomon FickBanks as below.

## 2018-08-14 ENCOUNTER — Telehealth: Payer: Self-pay | Admitting: Family Medicine

## 2018-08-14 NOTE — Telephone Encounter (Signed)
Please Advise if ok to give verbal orders 

## 2018-08-14 NOTE — Telephone Encounter (Signed)
Copied from CRM [id number]. Topic: Quick Communication - See Telephone Encounter >> Aug 14, 2018  1:55 PM Windy Kalata, NT wrote: CRM for notification. See Telephone encounter for: 08/14/18.  Natale Lay is with [facility] and is requesting verbal orders for nursing service and home health aid for home health aid 2x a week for 3 weeks and nurse for 1x a week for 3 weeks.   [phone]

## 2018-08-14 NOTE — Telephone Encounter (Signed)
Ok

## 2018-08-15 NOTE — Telephone Encounter (Signed)
Spoke with Corrie Dandy with [facility] voiced understanding that Dr Salomon Fick approves the verbal orders for home health on pt

## 2018-08-17 ENCOUNTER — Telehealth: Payer: Self-pay | Admitting: Cardiovascular Disease

## 2018-08-17 NOTE — Telephone Encounter (Signed)
Records received from [facility]. Placed in Chart Prep.

## 2018-08-23 ENCOUNTER — Other Ambulatory Visit: Payer: Self-pay | Admitting: Physician Assistant

## 2018-09-03 ENCOUNTER — Other Ambulatory Visit: Payer: Medicare Other | Admitting: *Deleted

## 2018-09-03 DIAGNOSIS — R7989 Other specified abnormal findings of blood chemistry: Secondary | ICD-10-CM

## 2018-09-04 LAB — BASIC METABOLIC PANEL
BUN/Creatinine Ratio: 15 (ref 12–28)
BUN: 14 mg/dL (ref 8–27)
CALCIUM: 9.4 mg/dL (ref 8.7–10.3)
CO2: 23 mmol/L (ref 20–29)
Chloride: 101 mmol/L (ref 96–106)
Creatinine, Ser: 0.93 mg/dL (ref 0.57–1.00)
GFR calc Af Amer: 66 mL/min/{1.73_m2} (ref >59)
GFR calc non Af Amer: 57 mL/min/{1.73_m2} — ABNORMAL LOW (ref >59)
GLUCOSE: 111 mg/dL — AB (ref 65–99)
Potassium: 4.1 mmol/L (ref 3.5–5.2)
Sodium: 139 mmol/L (ref 134–144)

## 2018-09-05 ENCOUNTER — Emergency Department (HOSPITAL_COMMUNITY): Payer: Medicare Other

## 2018-09-05 ENCOUNTER — Other Ambulatory Visit: Payer: Self-pay

## 2018-09-05 ENCOUNTER — Encounter (HOSPITAL_COMMUNITY): Payer: Self-pay | Admitting: Emergency Medicine

## 2018-09-05 ENCOUNTER — Emergency Department (HOSPITAL_COMMUNITY)
Admission: EM | Admit: 2018-09-05 | Discharge: 2018-09-05 | Disposition: A | Discharge status: Home / Self Care | Payer: Medicare Other | Attending: Emergency Medicine | Admitting: Emergency Medicine

## 2018-09-05 DIAGNOSIS — Z79899 Other long term (current) drug therapy: Secondary | ICD-10-CM | POA: Diagnosis not present

## 2018-09-05 DIAGNOSIS — I5043 Acute on chronic combined systolic (congestive) and diastolic (congestive) heart failure: Secondary | ICD-10-CM | POA: Diagnosis not present

## 2018-09-05 DIAGNOSIS — Z7984 Long term (current) use of oral hypoglycemic drugs: Secondary | ICD-10-CM | POA: Diagnosis not present

## 2018-09-05 DIAGNOSIS — E119 Type 2 diabetes mellitus without complications: Secondary | ICD-10-CM | POA: Diagnosis not present

## 2018-09-05 DIAGNOSIS — I110 Hypertensive heart disease with heart failure: Secondary | ICD-10-CM | POA: Diagnosis not present

## 2018-09-05 DIAGNOSIS — K5792 Diverticulitis of intestine, part unspecified, without perforation or abscess without bleeding: Secondary | ICD-10-CM

## 2018-09-05 DIAGNOSIS — K5780 Diverticulitis of intestine, part unspecified, with perforation and abscess without bleeding: Secondary | ICD-10-CM | POA: Insufficient documentation

## 2018-09-05 DIAGNOSIS — R109 Unspecified abdominal pain: Secondary | ICD-10-CM | POA: Diagnosis present

## 2018-09-05 LAB — COMPREHENSIVE METABOLIC PANEL
ALT: 33 U/L (ref 0–44)
ANION GAP: 13 (ref 5–15)
AST: 24 U/L (ref 15–41)
Albumin: 3.3 g/dL — ABNORMAL LOW (ref 3.5–5.0)
Alkaline Phosphatase: 40 U/L (ref 38–126)
BILIRUBIN TOTAL: 1.4 mg/dL — AB (ref 0.3–1.2)
BUN: 12 mg/dL (ref 8–23)
CO2: 20 mmol/L — ABNORMAL LOW (ref 22–32)
Calcium: 9.3 mg/dL (ref 8.9–10.3)
Chloride: 103 mmol/L (ref 98–111)
Creatinine, Ser: 1.02 mg/dL — ABNORMAL HIGH (ref 0.44–1.00)
GFR calc Af Amer: 57 mL/min — ABNORMAL LOW (ref >60)
GFR, EST NON AFRICAN AMERICAN: 49 mL/min — AB (ref >60)
Glucose, Bld: 117 mg/dL — ABNORMAL HIGH (ref 70–99)
Potassium: 3.3 mmol/L — ABNORMAL LOW (ref 3.5–5.1)
Sodium: 136 mmol/L (ref 135–145)
TOTAL PROTEIN: 8 g/dL (ref 6.5–8.1)

## 2018-09-05 LAB — URINALYSIS, ROUTINE W REFLEX MICROSCOPIC
Bilirubin Urine: NEGATIVE
Glucose, UA: NEGATIVE mg/dL
HGB URINE DIPSTICK: NEGATIVE
Ketones, ur: 5 mg/dL — AB
Leukocytes, UA: NEGATIVE
Nitrite: NEGATIVE
Protein, ur: NEGATIVE mg/dL
Specific Gravity, Urine: 1.004 — ABNORMAL LOW (ref 1.005–1.030)
pH: 6 (ref 5.0–8.0)

## 2018-09-05 LAB — CBC
HCT: 39.3 % (ref 36.0–46.0)
HEMOGLOBIN: 12.1 g/dL (ref 12.0–15.0)
MCH: 28 pg (ref 26.0–34.0)
MCHC: 30.8 g/dL (ref 30.0–36.0)
MCV: 91 fL (ref 78.0–100.0)
Platelets: 199 10*3/uL (ref 150–400)
RBC: 4.32 MIL/uL (ref 3.87–5.11)
RDW: 16.5 % — ABNORMAL HIGH (ref 11.5–15.5)
WBC: 8.1 10*3/uL (ref 4.0–10.5)

## 2018-09-05 LAB — LIPASE, BLOOD: Lipase: 27 U/L (ref 11–51)

## 2018-09-05 LAB — POC OCCULT BLOOD, ED: FECAL OCCULT BLD: NEGATIVE

## 2018-09-05 MED ORDER — LACTATED RINGERS IV BOLUS
500.0000 mL | Freq: Once | INTRAVENOUS | Status: AC
  Administered 2018-09-05: 500 mL via INTRAVENOUS

## 2018-09-05 MED ORDER — IOHEXOL 300 MG/ML  SOLN
100.0000 mL | Freq: Once | INTRAMUSCULAR | Status: AC | PRN
  Administered 2018-09-05: 100 mL via INTRAVENOUS

## 2018-09-05 MED ORDER — POTASSIUM CHLORIDE CRYS ER 20 MEQ PO TBCR
40.0000 meq | EXTENDED_RELEASE_TABLET | Freq: Once | ORAL | Status: AC
  Administered 2018-09-05: 40 meq via ORAL
  Filled 2018-09-05: qty 2

## 2018-09-05 MED ORDER — AMOXICILLIN-POT CLAVULANATE 875-125 MG PO TABS
1.0000 | ORAL_TABLET | Freq: Once | ORAL | Status: AC
  Administered 2018-09-05: 1 via ORAL
  Filled 2018-09-05: qty 1

## 2018-09-05 MED ORDER — IOPAMIDOL (ISOVUE-370) INJECTION 76%
INTRAVENOUS | Status: AC
  Filled 2018-09-05: qty 100

## 2018-09-05 MED ORDER — ACETAMINOPHEN 325 MG PO TABS
650.0000 mg | ORAL_TABLET | Freq: Once | ORAL | Status: AC
  Administered 2018-09-05: 650 mg via ORAL
  Filled 2018-09-05: qty 2

## 2018-09-05 MED ORDER — AMOXICILLIN-POT CLAVULANATE 875-125 MG PO TABS: 1.0000 | ORAL_TABLET | Freq: Two times a day (BID) | ORAL | 0 refills | Status: DC

## 2018-09-05 NOTE — ED Notes (Signed)
Pt back from CT

## 2018-09-05 NOTE — ED Notes (Signed)
Patient transported to CT 

## 2018-09-05 NOTE — Discharge Instructions (Signed)
If you develop worsening or severe diarrhea, dizziness, worsening abdominal pain, vomiting, fever, or any other new/concerning symptoms and return to the ER for evaluation.  If you are able to obtain a stool sample you may bring it to your primary care physician for testing.

## 2018-09-05 NOTE — ED Triage Notes (Addendum)
Pt with abdominal pains and diarrhea for 2 days. Son at beside reports he wants her to be checked for C.Diff bc she is having the same symptoms as before. Her stool is dark and green with foul odor. He states he last use of antibiotics was 1 month ago.  Son now reports that he has given her a laxative 2 days ago.

## 2018-09-05 NOTE — ED Provider Notes (Signed)
[facility] EMERGENCY DEPARTMENT Provider Note   CSN: [id number] Arrival date & time: 09/05/18  1207     History   Chief Complaint Chief Complaint  Patient presents with  . Abdominal Pain    HPI Chelsea Ward is a 84 y.o. female.  HPI  84 year old female presents with diarrhea and abdominal pain. Diarrhea started about 1 week ago, multiple loose stools per day. However at time she'll also be constipated, and has been given intermittent laxatives. Stools have turned dark, now black, and have odor. Similar to prior c diff. Finished most recent vancomycin 2 weeks ago. Has had generalized abdominal pain for about 2 days, feels sore. Worsens with diarrhea. No vomiting or fevers. No urinary symptoms. Felt dizzy last night.  Past Medical History:  Diagnosis Date  . Arthritis    "arms, knees" (06/12/2018)  . Atrial fibrillation with RVR (HCC) 06/12/2018  . CHF (congestive heart failure) (HCC)   . Heart disease   . Hypercholesteremia   . Hyperlipidemia   . Hypertension   . Type II diabetes mellitus [facility])     Patient Active Problem List   Diagnosis Date Noted  . Paroxysmal atrial fibrillation (HCC) 08/01/2018  . Chronic systolic CHF (congestive heart failure) (HCC) 08/01/2018  . Malnutrition of moderate degree 07/14/2018  . DKA (diabetic ketoacidoses) (HCC) 07/12/2018  . AKI (acute kidney injury) (HCC) 07/11/2018  . Diabetic ketoacidosis associated with type 2 diabetes mellitus (HCC) 07/11/2018  . Acute on chronic combined systolic and diastolic CHF (congestive heart failure) (HCC)   . Pressure injury of skin 06/13/2018  . Atrial fibrillation with RVR (HCC) 06/12/2018  . Seasonal allergies 06/04/2018  . Controlled type 2 diabetes mellitus without complication, without long-term current use of insulin (HCC) 06/04/2018  . Congestive heart failure (HCC) 06/04/2018  . C. difficile colitis 05/17/2018  . SIRS (systemic inflammatory response syndrome) (HCC) 05/16/2018   . Diarrhea 05/16/2018  . Diabetes mellitus type 2 in nonobese (HCC) 05/16/2018  . HLD (hyperlipidemia) 05/16/2018    Past Surgical History:  Procedure Laterality Date  . CATARACT EXTRACTION, BILATERAL Bilateral   . COLON SURGERY     "my bowels lock"  . TUBAL LIGATION    . VAGINAL HYSTERECTOMY       OB History   None      Home Medications    Prior to Admission medications   Medication Sig Start Date End Date Taking? Authorizing Provider  acetaminophen (TYLENOL) 325 MG tablet Take 2 tablets (650 mg total) by mouth every 8 (eight) hours. 06/21/18  Yes Emokpae, Courage, MD  apixaban (ELIQUIS) 2.5 MG TABS tablet Take 1 tablet (2.5 mg total) by mouth 2 (two) times daily. 08/01/18  Yes Weaver, Loras Grieshop T, PA-C  ezetimibe (ZETIA) 10 MG tablet Take 10 mg by mouth daily. 01/29/18  Yes [provider]  feeding supplement, GLUCERNA SHAKE, (GLUCERNA SHAKE) LIQD Take 237 mLs by mouth 2 (two) times daily between meals. 06/21/18  Yes Emokpae, Courage, MD  fexofenadine (ALLEGRA) 60 MG tablet Take 1 tablet (60 mg total) by mouth daily. 06/04/18  Yes Deeann Saint, MD  fluticasone (FLONASE) 50 MCG/ACT nasal spray Place 1-2 sprays into both nostrils daily. 07/04/18  Yes [provider]  JANUVIA 100 MG tablet Take 100 mg by mouth daily. 03/02/18  Yes [provider]  Omega-3 Fatty Acids (FISH OIL) 500 MG CAPS Take 1 capsule by mouth 2 (two) times daily.   Yes [provider]  ondansetron (ZOFRAN ODT)  4 MG disintegrating tablet Take 1 tablet (4 mg total) by mouth every 8 (eight) hours as needed for nausea or vomiting. 06/21/18  Yes Emokpae, Courage, MD  polyvinyl alcohol (LIQUIFILM TEARS) 1.4 % ophthalmic solution Place 1 drop into both eyes as needed for dry eyes.    Yes [provider]  prednisoLONE acetate (PRED FORTE) 1 % ophthalmic suspension Place 1 drop into both eyes 4 (four) times daily. 08/31/18  Yes [provider]  saccharomyces boulardii  (FLORASTOR) 250 MG capsule Take 1 capsule (250 mg total) by mouth 2 (two) times daily. 06/21/18  Yes Shon Hale, MD  amoxicillin-clavulanate (AUGMENTIN) 875-125 MG tablet Take 1 tablet by mouth 2 (two) times daily. One po bid x 7 days 09/05/18   Pricilla Loveless, MD  vancomycin (VANCOCIN) 50 mg/mL oral solution Take 2.5 mLs (125 mg total) by mouth every 3 (three) days for 10 doses. Patient not taking: Reported on 09/05/2018 09/10/18 10/08/18  Delano Metz, MD    Family History Family History  Problem Relation Age of Onset  . Diabetes Mellitus II Son   . Arthritis Mother   . Hearing loss Mother   . Arthritis Father   . Heart attack Father   . Arthritis Sister   . Heart attack Sister     Social History Social History   Tobacco Use  . Smoking status: Never Smoker  . Smokeless tobacco: Never Used  Substance Use Topics  . Alcohol use: Never    Frequency: Never  . Drug use: Never     Allergies   Patient has no known allergies.   Review of Systems Review of Systems  Constitutional: Negative for fever.  Gastrointestinal: Positive for abdominal pain, constipation and diarrhea. Negative for nausea and vomiting.  Genitourinary: Negative for dysuria.  Neurological: Positive for dizziness.  All other systems reviewed and are negative.    Physical Exam Updated Vital Signs BP 108/82   Pulse 80   Temp 98.4 F (36.9 C) (Oral)   Resp 17   SpO2 100%   Physical Exam  Constitutional: She appears well-developed and well-nourished.  Non-toxic appearance. She does not appear ill. No distress.  HENT:  Head: Normocephalic and atraumatic.  Right Ear: External ear normal.  Left Ear: External ear normal.  Nose: Nose normal.  Eyes: Right eye exhibits no discharge. Left eye exhibits no discharge.  Cardiovascular: Normal rate, regular rhythm and normal heart sounds.  Pulmonary/Chest: Effort normal and breath sounds normal.  Abdominal: Soft. There is generalized tenderness (minimal,  nonfocal).  Neurological: She is alert.  Skin: Skin is warm and dry.  Psychiatric: She has a normal mood and affect.  Nursing note and vitals reviewed.    ED Treatments / Results  Labs (all labs ordered are listed, but only abnormal results are displayed) Labs Reviewed  COMPREHENSIVE METABOLIC PANEL - Abnormal; Notable for the following components:      Result Value   Potassium 3.3 (*)    CO2 20 (*)    Glucose, Bld 117 (*)    Creatinine, Ser 1.02 (*)    Albumin 3.3 (*)    Total Bilirubin 1.4 (*)    GFR calc non Af Amer 49 (*)    GFR calc Af Amer 57 (*)    All other components within normal limits  CBC - Abnormal; Notable for the following components:   RDW 16.5 (*)    All other components within normal limits  URINALYSIS, ROUTINE W REFLEX MICROSCOPIC - Abnormal; Notable for the  following components:   Specific Gravity, Urine 1.004 (*)    Ketones, ur 5 (*)    All other components within normal limits  LIPASE, BLOOD  POC OCCULT BLOOD, ED    EKG None  Radiology Ct Abdomen Pelvis W Contrast  Result Date: 09/05/2018 CLINICAL DATA:  Lower abdominal pain since Monday, history CHF, hypertension, type II diabetes mellitus EXAM: CT ABDOMEN AND PELVIS WITH CONTRAST TECHNIQUE: Multidetector CT imaging of the abdomen and pelvis was performed using the standard protocol following bolus administration of intravenous contrast. Sagittal and coronal MPR images reconstructed from axial data set. CONTRAST:  OMNIPAQUE IOHEXOL 300 MG/ML SOLN IV. No oral contrast. COMPARISON:  07/12/2018 FINDINGS: Lower chest: Bibasilar dependent atelectasis Hepatobiliary: Tiny cyst LEFT lobe liver. Gallbladder and liver otherwise normal appearance. Pancreas: Normal appearance Spleen: Normal appearance Adrenals/Urinary Tract: Adrenal glands normal appearance. BILATERAL renal cortical atrophy and tiny cysts. No additional renal mass or hydronephrosis. Bladder and ureters normal appearance. No urinary tract  calcification. Stomach/Bowel: Normal appendix. Diverticulosis of descending and sigmoid colon. Subtle infiltrative changes are seen at the mid sigmoid colon suggesting subtle acute diverticulitis. No evidence of abscess or perforation. Stomach and remaining bowel loops unremarkable. Vascular/Lymphatic: Atherosclerotic calcifications aorta and iliac arteries without aneurysm. Minimal coronary arterial calcification. No adenopathy. Scattered pelvic phleboliths. Reproductive: Uterus surgically absent with nonvisualization of ovaries. Other: No free air or free fluid.  No hernia. Musculoskeletal: Bones diffusely demineralized. IMPRESSION: Diverticulosis of descending and sigmoid colon with subtle pericolic inflammatory changes at the mid sigmoid colon suspicious for mild acute diverticulitis. No evidence of perforation or abscess. Electronically Signed   By: Ulyses Southward M.D.   On: 09/05/2018 17:53    Procedures Procedures (including critical care time)  Medications Ordered in ED Medications  lactated ringers bolus 500 mL (0 mLs Intravenous Stopped 09/05/18 1632)  acetaminophen (TYLENOL) tablet 650 mg (650 mg Oral Given 09/05/18 1549)  potassium chloride SA (K-DUR,KLOR-CON) CR tablet 40 mEq (40 mEq Oral Given 09/05/18 1703)  iohexol (OMNIPAQUE) 300 MG/ML solution 100 mL (100 mLs Intravenous Contrast Given 09/05/18 1640)  amoxicillin-clavulanate (AUGMENTIN) 875-125 MG per tablet 1 tablet (1 tablet Oral Given 09/05/18 1828)     Initial Impression / Assessment and Plan / ED Course  I have reviewed the triage vital signs and the nursing notes.  Pertinent labs & imaging results that were available during my care of the patient were reviewed by me and considered in my medical decision making (see chart for details).     The patient had one bowel movement in the emergency department but unfortunately was contaminated with urine and was unable to be sent for C. difficile testing.  However given no other bowel  movements in a multi hour ED stay I think C. difficile is less likely.  Given the abdominal tenderness and pain, CT obtained which does show possible mild diverticulitis, which is probably more likely causing her symptoms.  Some of her diarrhea has been the result of laxatives as well which makes C. difficile less likely.  I will give her antibiotics and counseled her that this could result in an increased diarrhea and/or C. difficile but given the pain I think this is reasonable.  Otherwise, she endorses dark stools but point-of-care occult testing after rectal exam by me is unremarkable.  Thus, discharged home with return precautions.  Final Clinical Impressions(s) / ED Diagnoses   Final diagnoses:  Acute diverticulitis    ED Discharge Orders  Ordered    amoxicillin-clavulanate (AUGMENTIN) 875-125 MG tablet  2 times daily     09/05/18 1819           Pricilla Loveless, MD 09/05/18 2337

## 2018-09-13 ENCOUNTER — Ambulatory Visit (INDEPENDENT_AMBULATORY_CARE_PROVIDER_SITE_OTHER): Payer: Medicare Other | Admitting: Family Medicine

## 2018-09-13 ENCOUNTER — Encounter: Payer: Self-pay | Admitting: Family Medicine

## 2018-09-13 VITALS — BP 110/60 | HR 72 | Temp 97.7°F | Wt 130.0 lb

## 2018-09-13 DIAGNOSIS — K5792 Diverticulitis of intestine, part unspecified, without perforation or abscess without bleeding: Secondary | ICD-10-CM | POA: Diagnosis not present

## 2018-09-13 DIAGNOSIS — E876 Hypokalemia: Secondary | ICD-10-CM

## 2018-09-13 DIAGNOSIS — K529 Noninfective gastroenteritis and colitis, unspecified: Secondary | ICD-10-CM

## 2018-09-13 LAB — BASIC METABOLIC PANEL
BUN: 19 mg/dL (ref 6–23)
CALCIUM: 9.7 mg/dL (ref 8.4–10.5)
CO2: 29 mEq/L (ref 19–32)
Chloride: 102 mEq/L (ref 96–112)
Creatinine, Ser: 1.06 mg/dL (ref 0.40–1.20)
GFR: 63.56 mL/min (ref >60.00)
GLUCOSE: 102 mg/dL — AB (ref 70–99)
Potassium: 4.3 mEq/L (ref 3.5–5.1)
SODIUM: 138 meq/L (ref 135–145)

## 2018-09-13 LAB — CBC WITH DIFFERENTIAL/PLATELET
BASOS ABS: 0.1 10*3/uL (ref 0.0–0.1)
Basophils Relative: 0.8 % (ref 0.0–3.0)
Eosinophils Absolute: 0.1 10*3/uL (ref 0.0–0.7)
Eosinophils Relative: 2 % (ref 0.0–5.0)
HCT: 39.3 % (ref 36.0–46.0)
Hemoglobin: 12.8 g/dL (ref 12.0–15.0)
LYMPHS ABS: 2.3 10*3/uL (ref 0.7–4.0)
Lymphocytes Relative: 30.2 % (ref 12.0–46.0)
MCHC: 32.6 g/dL (ref 30.0–36.0)
MCV: 86.9 fl (ref 78.0–100.0)
MONOS PCT: 7.8 % (ref 3.0–12.0)
Monocytes Absolute: 0.6 10*3/uL (ref 0.1–1.0)
NEUTROS PCT: 59.2 % (ref 43.0–77.0)
Neutro Abs: 4.4 10*3/uL (ref 1.4–7.7)
PLATELETS: 253 10*3/uL (ref 150.0–400.0)
RBC: 4.52 Mil/uL (ref 3.87–5.11)
RDW: 17.2 % — ABNORMAL HIGH (ref 11.5–15.5)
WBC: 7.5 10*3/uL (ref 4.0–10.5)

## 2018-09-13 NOTE — Patient Instructions (Addendum)
Food Choices to Help Relieve Diarrhea, Adult When you have diarrhea, the foods you eat and your eating habits are very important. Choosing the right foods and drinks can help:  Relieve diarrhea.  Replace lost fluids and nutrients.  Prevent dehydration.  What general guidelines should I follow? Relieving diarrhea  Choose foods with less than 2 g or .07 oz. of fiber per serving.  Limit fats to less than 8 tsp (38 g or 1.34 oz.) a day.  Avoid the following: ? Foods and beverages sweetened with high-fructose corn syrup, honey, or sugar alcohols such as xylitol, sorbitol, and mannitol. ? Foods that contain a lot of fat or sugar. ? Fried, greasy, or spicy foods. ? High-fiber grains, breads, and cereals. ? Raw fruits and vegetables.  Eat foods that are rich in probiotics. These foods include dairy products such as yogurt and fermented milk products. They help increase healthy bacteria in the stomach and intestines (gastrointestinal tract, or GI tract).  If you have lactose intolerance, avoid dairy products. These may make your diarrhea worse.  Take medicine to help stop diarrhea (antidiarrheal medicine) only as told by your health care provider. Replacing nutrients  Eat small meals or snacks every 3-4 hours.  Eat bland foods, such as white rice, toast, or baked potato, until your diarrhea starts to get better. Gradually reintroduce nutrient-rich foods as tolerated or as told by your health care provider. This includes: ? Well-cooked protein foods. ? Peeled, seeded, and soft-cooked fruits and vegetables. ? Low-fat dairy products.  Take vitamin and mineral supplements as told by your health care provider. Preventing dehydration   Start by sipping water or a special solution to prevent dehydration (oral rehydration solution, ORS). Urine that is clear or pale yellow means that you are getting enough fluid.  Try to drink at least 8-10 cups of fluid each day to help replace lost  fluids.  You may add other liquids in addition to water, such as clear juice or decaffeinated sports drinks, as tolerated or as told by your health care provider.  Avoid drinks with caffeine, such as coffee, tea, or soft drinks.  Avoid alcohol. What foods are recommended? The items listed may not be a complete list. Talk with your health care provider about what dietary choices are best for you. Grains White rice. White, French, or pita breads (fresh or toasted), including plain rolls, buns, or bagels. White pasta. Saltine, soda, or graham crackers. Pretzels. Low-fiber cereal. Cooked cereals made with water (such as cornmeal, farina, or cream cereals). Plain muffins. Matzo. Melba toast. Zwieback. Vegetables Potatoes (without the skin). Most well-cooked and canned vegetables without skins or seeds. Tender lettuce. Fruits Apple sauce. Fruits canned in juice. Cooked apricots, cherries, grapefruit, peaches, pears, or plums. Fresh bananas and cantaloupe. Meats and other protein foods Baked or boiled chicken. Eggs. Tofu. Fish. Seafood. Smooth nut butters. Ground or well-cooked tender beef, ham, veal, lamb, pork, or poultry. Dairy Plain yogurt, kefir, and unsweetened liquid yogurt. Lactose-free milk, buttermilk, skim milk, or soy milk. Low-fat or nonfat hard cheese. Beverages Water. Low-calorie sports drinks. Fruit juices without pulp. Strained tomato and vegetable juices. Decaffeinated teas. Sugar-free beverages not sweetened with sugar alcohols. Oral rehydration solutions, if approved by your health care provider. Seasoning and other foods Bouillon, broth, or soups made from recommended foods. What foods are not recommended? The items listed may not be a complete list. Talk with your health care provider about what dietary choices are best for you. Grains Whole grain, whole wheat,   bran, or rye breads, rolls, pastas, and crackers. Wild or brown rice. Whole grain or bran cereals. Barley. Oats and  oatmeal. Corn tortillas or taco shells. Granola. Popcorn. Vegetables Raw vegetables. Fried vegetables. Cabbage, broccoli, Brussels sprouts, artichokes, baked beans, beet greens, corn, kale, legumes, peas, sweet potatoes, and yams. Potato skins. Cooked spinach and cabbage. Fruits Dried fruit, including raisins and dates. Raw fruits. Stewed or dried prunes. Canned fruits with syrup. Meat and other protein foods Fried or fatty meats. Deli meats. Chunky nut butters. Nuts and seeds. Beans and lentils. Chelsea Ward. Hot dogs. Sausage. Dairy High-fat cheeses. Whole milk, chocolate milk, and beverages made with milk, such as milk shakes. Half-and-half. Cream. sour cream. Ice cream. Beverages Caffeinated beverages (such as coffee, tea, soda, or energy drinks). Alcoholic beverages. Fruit juices with pulp. Prune juice. Soft drinks sweetened with high-fructose corn syrup or sugar alcohols. High-calorie sports drinks. Fats and oils Butter. Cream sauces. Margarine. Salad oils. Plain salad dressings. Olives. Avocados. Mayonnaise. Sweets and desserts Sweet rolls, doughnuts, and sweet breads. Sugar-free desserts sweetened with sugar alcohols such as xylitol and sorbitol. Seasoning and other foods Honey. Hot sauce. Chili powder. Gravy. Cream-based or milk-based soups. Pancakes and waffles. Summary  When you have diarrhea, the foods you eat and your eating habits are very important.  Make sure you get at least 8-10 cups of fluid each day, or enough to keep your urine clear or pale yellow.  Eat bland foods and gradually reintroduce healthy, nutrient-rich foods as tolerated, or as told by your health care provider.  Avoid high-fiber, fried, greasy, or spicy foods. This information is not intended to replace advice given to you by your health care provider. Make sure you discuss any questions you have with your health care provider. Document Released: 02/11/2004 Document Revised: 11/18/2016 Document Reviewed:  11/18/2016 Elsevier Interactive Patient Education  2018 [employer].  Diverticulitis Diverticulitis is when small pockets in your large intestine (colon) get infected or swollen. This causes stomach pain and watery poop (diarrhea). These pouches are called diverticula. They form in people who have a condition called diverticulosis. Follow these instructions at home: Medicines  Take over-the-counter and prescription medicines only as told by your doctor. These include: ? Antibiotics. ? Pain medicines. ? Fiber pills. ? Probiotics. ? Stool softeners.  Do not drive or use heavy machinery while taking prescription pain medicine.  If you were prescribed an antibiotic, take it as told. Do not stop taking it even if you feel better. General instructions  Follow a diet as told by your doctor.  When you feel better, your doctor may tell you to change your diet. You may need to eat a lot of fiber. Fiber makes it easier to poop (have bowel movements). Healthy foods with fiber include: ? Berries. ? Beans. ? Lentils. ? Green vegetables.  Exercise 3 or more times a week. Aim for 30 minutes each time. Exercise enough to sweat and make your heart beat faster.  Keep all follow-up visits as told. This is important. You may need to have an exam of the large intestine. This is called a colonoscopy. Contact a doctor if:  Your pain does not get better.  You have a hard time eating or drinking.  You are not pooping like normal. Get help right away if:  Your pain gets worse.  Your problems do not get better.  Your problems get worse very fast.  You have a fever.  You throw up (vomit) more than one time.  You have  poop that is: ? Bloody. ? Black. ? Tarry. Summary  Diverticulitis is when small pockets in your large intestine (colon) get infected or swollen.  Take medicines only as told by your doctor.  Follow a diet as told by your doctor. This information is not intended to  replace advice given to you by your health care provider. Make sure you discuss any questions you have with your health care provider. Document Released: 05/09/2008 Document Revised: 12/08/2016 Document Reviewed: 12/08/2016 Elsevier Interactive Patient Education  2017 [employer].

## 2018-09-13 NOTE — Progress Notes (Signed)
Subjective:    Patient ID: Chelsea Ward, female    DOB: [birth year], 84 y.o.   MRN: [id number]  No chief complaint on file. pt is accompanied by her son's girlfriend, Lucille Passy.  HPI Patient was seen today for f/u.  Pt was hospitalized 07/11/18-07/17/18 for C. Diff colitis, DKA, and AKI for which she completed a course of vancomycin.  Pt was seen again in the ED 09/05/18 for diverticulitis, started on Augmentin BID x 7 days.  Since this last ED visit, pt notes abdominal soreness.  Loose stools have decreased.  Pt states she feels dizzy and weak at times.  Pt is drinking more water and has a good appetite.  Pt denies fever, chills, N/V, hematochezia.   Past Medical History:  Diagnosis Date  . Arthritis    "arms, knees" (06/12/2018)  . Atrial fibrillation with RVR (HCC) 06/12/2018  . CHF (congestive heart failure) (HCC)   . Heart disease   . Hypercholesteremia   . Hyperlipidemia   . Hypertension   . Type II diabetes mellitus (HCC)     No Known Allergies  ROS General: Denies fever, chills, night sweats, changes in weight, changes in appetite   +dizziness, weakness HEENT: Denies headaches, ear pain, changes in vision, rhinorrhea, sore throat CV: Denies CP, palpitations, SOB, orthopnea Pulm: Denies SOB, cough, wheezing GI: Denies nausea, vomiting, diarrhea, constipation  +abdominal pain GU: Denies dysuria, hematuria, frequency, vaginal discharge Msk: Denies muscle cramps, joint pains Neuro: Denies weakness, numbness, tingling Skin: Denies rashes, bruising Psych: Denies depression, anxiety, hallucinations     Objective:    Blood pressure 110/60, pulse 72, temperature 97.7 F (36.5 C), temperature source Oral, weight 130 lb (59 kg), SpO2 98 %.   Gen. Pleasant, well-nourished, in no distress, normal affect   HEENT: [place]/AT, face symmetric, no scleral icterus, PERRLA, nares patent without drainage. Lungs: no accessory muscle use, CTAB, no wheezes or rales Cardiovascular: RRR, no m/r/g, no  peripheral edema Abdomen: BS present, soft, mild diffuse TTP, ND. Neuro:  A&Ox3, CN II-XII intact, using a walker to assist with ambulation, moving faster than previous visits.   Wt Readings from Last 3 Encounters:  09/13/18 130 lb (59 kg)  08/01/18 130 lb 4 oz (59.1 kg)  07/11/18 130 lb (59 kg)    Lab Results  Component Value Date   WBC 8.1 09/05/2018   HGB 12.1 09/05/2018   HCT 39.3 09/05/2018   PLT 199 09/05/2018   GLUCOSE 117 (H) 09/05/2018   CHOL 99 06/13/2018   TRIG 63 06/13/2018   HDL 33 (L) 06/13/2018   LDLCALC 53 06/13/2018   ALT 33 09/05/2018   AST 24 09/05/2018   NA 136 09/05/2018   K 3.3 (L) 09/05/2018   CL 103 09/05/2018   CREATININE 1.02 (H) 09/05/2018   BUN 12 09/05/2018   CO2 20 (L) 09/05/2018   TSH 1.606 06/13/2018   INR 1.47 07/11/2018   HGBA1C 6.7 (H) 07/13/2018    Assessment/Plan:  Diverticulitis  -abx course completed -discussed ways to reduce flares.  Likely aggravated by laxative use and recurrent C. Diff infections. -discussed using miralax for regular BMs -consider a probiotic -given handouts - Plan: Basic metabolic panel, CBC with Differential/Platelet  Hypokalemia  - Plan: Basic metabolic panel   F/u prn  Abbe Amsterdam, MD

## 2018-10-10 ENCOUNTER — Encounter: Payer: Self-pay | Admitting: Cardiovascular Disease

## 2018-10-10 ENCOUNTER — Ambulatory Visit (INDEPENDENT_AMBULATORY_CARE_PROVIDER_SITE_OTHER): Payer: Medicare Other | Admitting: Cardiovascular Disease

## 2018-10-10 VITALS — BP 96/60 | HR 68 | Ht 64.0 in | Wt 132.0 lb

## 2018-10-10 DIAGNOSIS — I4819 Other persistent atrial fibrillation: Secondary | ICD-10-CM | POA: Diagnosis not present

## 2018-10-10 DIAGNOSIS — I4891 Unspecified atrial fibrillation: Secondary | ICD-10-CM

## 2018-10-10 MED ORDER — APIXABAN 2.5 MG PO TABS: 2.5000 mg | ORAL_TABLET | Freq: Two times a day (BID) | ORAL | 3 refills | Status: AC

## 2018-10-10 NOTE — Patient Instructions (Signed)

## 2018-10-10 NOTE — Progress Notes (Signed)
Cardiology Office Note:    Date:  10/10/2018   ID:  Chelsea Ward, DOB [birth year], MRN [id number]  PCP:  Deeann Saint, MD  Cardiologist:  Kristeen Miss, MD  Electrophysiologist:  None   Referring MD: Deeann Saint, MD   Problem list 1.  Chronic systolic congestive heart failure 2.  Hypertension 3.  Hyperlipidemia 4.  Diabetes mellitus 5.  Paroxysmal atrial fibrillation  Chief Complaint  Patient presents with  . Atrial Fibrillation     Nov. 6, 2019:    Chelsea Ward is a 84 y.o. female with a hx of CHF , hypertension, hyperlipidemia and paroxysmal atrial fibrillation.  Was seen by Tereso Newcomer in August.  She started on Eliquis 2.5 mg twice a day.  Aspirin was stopped at that time. Had a bloody nose,   Otherwise she has  BP is low today  Eating and drinking regularly   Past Medical History:  Diagnosis Date  . Arthritis    "arms, knees" (06/12/2018)  . Atrial fibrillation with RVR (HCC) 06/12/2018  . CHF (congestive heart failure) (HCC)   . Degenerative joint disease   . Diabetic retinopathy (HCC)   . Diverticulitis   . Diverticulosis   . Dyslipidemia   . Gastroparesis   . Glaucoma   . H. pylori infection   . Heart disease   . Hypercholesteremia   . Hyperlipidemia   . Hypertension   . Hypertensive cardiomyopathy (HCC)   . Ischemic bowel disease (HCC)   . Osteoarthritis   . Osteopenia of the elderly   . Peripheral neuropathy   . Renal insufficiency syndrome   . Retinopathy   . Type II diabetes mellitus (HCC)   . Venous (peripheral) insufficiency     Past Surgical History:  Procedure Laterality Date  . CATARACT EXTRACTION, BILATERAL Bilateral   . COLON SURGERY     "my bowels lock"  . EXPLORATORY LAPAROTOMY W/ BOWEL RESECTION    . right temporal artery biopsy    . TUBAL LIGATION    . VAGINAL HYSTERECTOMY      Current Medications: Current Meds  Medication Sig  . acetaminophen (TYLENOL) 325 MG tablet Take 2 tablets (650 mg total) by mouth every  8 (eight) hours.  Marland Kitchen apixaban (ELIQUIS) 2.5 MG TABS tablet Take 1 tablet (2.5 mg total) by mouth 2 (two) times daily.  Marland Kitchen ezetimibe (ZETIA) 10 MG tablet Take 10 mg by mouth daily.  . feeding supplement, GLUCERNA SHAKE, (GLUCERNA SHAKE) LIQD Take 237 mLs by mouth 2 (two) times daily between meals.  . fexofenadine (ALLEGRA) 60 MG tablet Take 1 tablet (60 mg total) by mouth daily.  . fluticasone (FLONASE) 50 MCG/ACT nasal spray Place 1-2 sprays into both nostrils daily.  Marland Kitchen JANUVIA 100 MG tablet Take 100 mg by mouth daily.  . Omega-3 Fatty Acids (FISH OIL) 500 MG CAPS Take 1 capsule by mouth 2 (two) times daily.  . ondansetron (ZOFRAN ODT) 4 MG disintegrating tablet Take 1 tablet (4 mg total) by mouth every 8 (eight) hours as needed for nausea or vomiting.  . polyvinyl alcohol (LIQUIFILM TEARS) 1.4 % ophthalmic solution Place 1 drop into both eyes as needed for dry eyes.   . prednisoLONE acetate (PRED FORTE) 1 % ophthalmic suspension Place 1 drop into both eyes 4 (four) times daily.  Marland Kitchen saccharomyces boulardii (FLORASTOR) 250 MG capsule Take 1 capsule (250 mg total) by mouth 2 (two) times daily.  . [DISCONTINUED] apixaban (ELIQUIS) 2.5 MG TABS tablet Take 1 tablet (2.5 mg  total) by mouth 2 (two) times daily.     Allergies:   Patient has no known allergies.   Social History   Socioeconomic History  . Marital status: Widowed    Spouse name: Not on file  . Number of children: Not on file  . Years of education: Not on file  . Highest education level: Not on file  Occupational History  . Occupation: Retired  Engineer, production  . Financial resource strain: Not on file  . Food insecurity:    Worry: Not on file    Inability: Not on file  . Transportation needs:    Medical: Not on file    Non-medical: Not on file  Tobacco Use  . Smoking status: Never Smoker  . Smokeless tobacco: Never Used  Substance and Sexual Activity  . Alcohol use: Never    Frequency: Never  . Drug use: Never  . Sexual  activity: Not Currently  Lifestyle  . Physical activity:    Days per week: Not on file    Minutes per session: Not on file  . Stress: Not on file  Relationships  . Social connections:    Talks on phone: Not on file    Gets together: Not on file    Attends religious service: Not on file    Active member of club or organization: Not on file    Attends meetings of clubs or organizations: Not on file    Relationship status: Not on file  Other Topics Concern  . Not on file  Social History Narrative  . Not on file     Family History: The patient's family history includes Anemia in her daughter; Arthritis in her father and sister; Asthma in her daughter; Diabetes in her daughter; Diabetes Mellitus II in her sister and son; Hearing loss in her mother; Heart attack in her brother, father, sister, and sister; Hyperlipidemia in her daughter; Hypertension in her brother, father, and mother; Hypothyroidism in her mother; Osteoarthritis in her mother; Osteoporosis in her daughter.  ROS:   Please see the history of present illness.     All other systems reviewed and are negative.  EKGs/Labs/Other Studies Reviewed:    The following studies were reviewed today:   EKG:     Recent Labs: 06/12/2018: B Natriuretic Peptide 189.5 06/13/2018: TSH 1.606 07/17/2018: Magnesium 1.8 09/05/2018: ALT 33 09/13/2018: BUN 19; Creatinine, Ser 1.06; Hemoglobin 12.8; Platelets 253.0; Potassium 4.3; Sodium 138  Recent Lipid Panel    Component Value Date/Time   CHOL 99 06/13/2018 0521   TRIG 63 06/13/2018 0521   HDL 33 (L) 06/13/2018 0521   CHOLHDL 3.0 06/13/2018 0521   VLDL 13 06/13/2018 0521   LDLCALC 53 06/13/2018 0521    Physical Exam:    VS:  BP 96/60   Pulse 68   Ht 5\' 4"  (1.626 m)   Wt 132 lb (59.9 kg)   SpO2 97%   BMI 22.66 kg/m     Wt Readings from Last 3 Encounters:  10/10/18 132 lb (59.9 kg)  09/13/18 130 lb (59 kg)  08/01/18 130 lb 4 oz (59.1 kg)     GEN: Elderly, frail female, no  acute distress HEENT: Normal NECK: No JVD; No carotid bruits LYMPHATICS: No lymphadenopathy CARDIAC: Regular Lee irregular RESPIRATORY:  Clear to auscultation without rales, wheezing or rhonchi  ABDOMEN: Soft, non-tender, non-distended MUSCULOSKELETAL:  No edema; No deformity  SKIN: Warm and dry NEUROLOGIC:  Alert and oriented x 3 PSYCHIATRIC:  Normal affect  ASSESSMENT:    No diagnosis found. PLAN:    In order of problems listed above:  1. Persistent atrial fib : The patient is in atrial for ablation today.  She is on Eliquis 2.5 mg twice a day.  She cannot tell whether or not she is in A. Fib.  Is well controlled.  For now we will continue with a rate control and anticoagulation strategy.   Medication Adjustments/Labs and Tests Ordered: Current medicines are reviewed at length with the patient today.  Concerns regarding medicines are outlined above.  No orders of the defined types were placed in this encounter.  Meds ordered this encounter  Medications  . apixaban (ELIQUIS) 2.5 MG TABS tablet    Sig: Take 1 tablet (2.5 mg total) by mouth 2 (two) times daily.    Dispense:  180 tablet    Refill:  3    Patient Instructions  Medication Instructions:  Your physician recommends that you continue on your current medications as directed. Please refer to the Current Medication list given to you today.  If you need a refill on your cardiac medications before your next appointment, please call your pharmacy.    Lab work: None Ordered    Testing/Procedures: None Ordered   Follow-Up: At [facility], you and your health needs are our priority.  As part of our continuing mission to provide you with exceptional heart care, we have created designated Provider Care Teams.  These Care Teams include your primary Cardiologist (physician) and Advanced Practice Providers (APPs -  Physician Assistants and Nurse Practitioners) who all work together to provide you with the care you  need, when you need it. You will need a follow up appointment in:  6 months.  Please call our office 2 months in advance to schedule this appointment.  You may see Kristeen Miss, MD or one of the following Advanced Practice Providers on your designated Care Team: Tereso Newcomer, PA-C [place], [place] . Berton Bon, NP      Signed, Kristeen Miss, MD  10/10/2018 11:08 AM    [facility]

## 2018-11-15 ENCOUNTER — Other Ambulatory Visit: Payer: Self-pay | Admitting: Family Medicine

## 2018-11-15 DIAGNOSIS — J302 Other seasonal allergic rhinitis: Secondary | ICD-10-CM

## 2018-11-15 DIAGNOSIS — R059 Cough, unspecified: Secondary | ICD-10-CM

## 2018-11-15 DIAGNOSIS — R05 Cough: Secondary | ICD-10-CM

## 2019-06-21 ENCOUNTER — Telehealth: Payer: Self-pay

## 2019-06-21 NOTE — Telephone Encounter (Signed)
Called to schedule 6 mth f/u with Richardson Dopp per recall.Marland KitchenMarland KitchenSon states that she is currently staying in [place] and has a Film/video editor she is seeing while she is there. She will call to schedule when she gets back in town.

## 2019-09-08 IMAGING — CT CT ABD-PELV W/O CM
2 of 4 series · 15 of 46 positions shown, 17 images · non-contrast
Comparison: CT of the abdomen and pelvis from 05/16/2018

CLINICAL DATA: Acute onset of generalized weakness. Black stools
and blood in stool.

EXAM:
CT ABDOMEN AND PELVIS WITHOUT CONTRAST
TECHNIQUE: Multidetector CT imaging of the abdomen and pelvis was performed
following the standard protocol without IV contrast.

[Series 3: ap without · axial · non-contrast · 0.76mm/px · z∈[+820,+1220]mm · 12 of 90 slices shown, 14 images]
[im 5/90  soft-tissue]
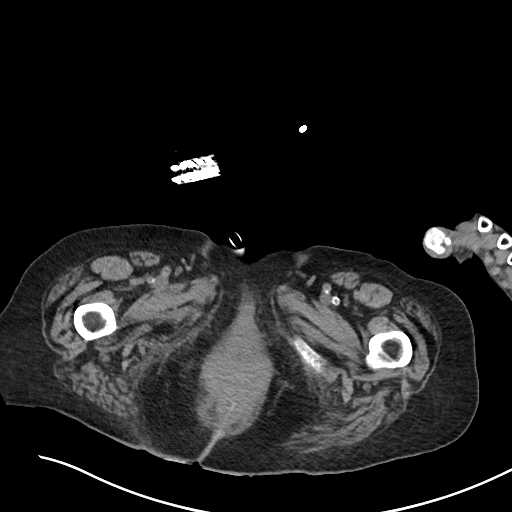
[im 5/90  bone]
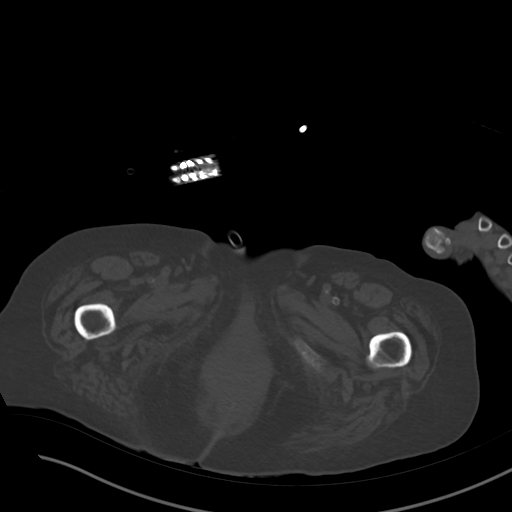
[im 15/90  soft-tissue]
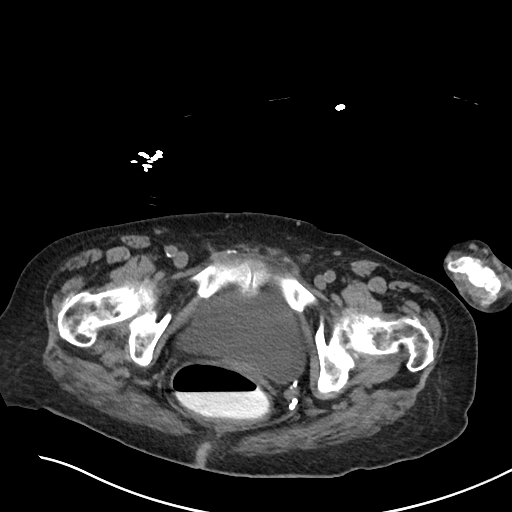
[im 19/90  soft-tissue]
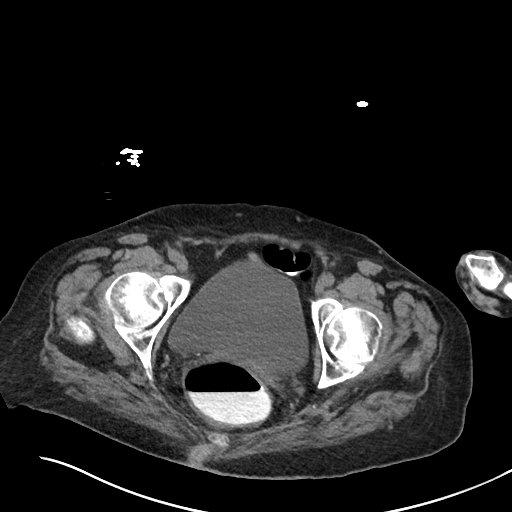
[im 29/90  soft-tissue]
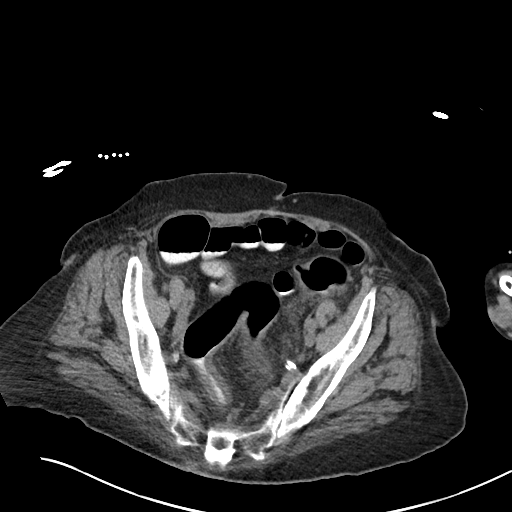
[im 33/90  soft-tissue]
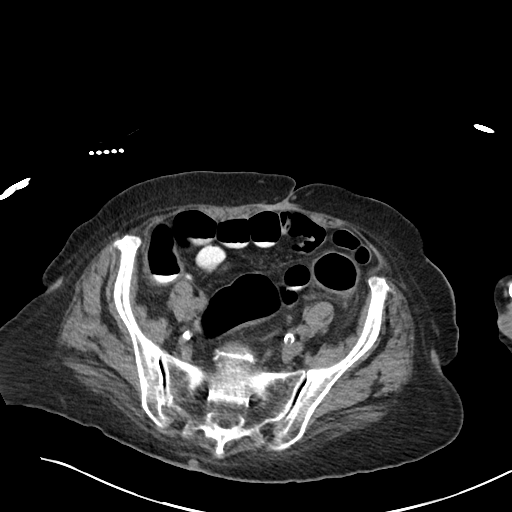
[im 43/90  soft-tissue]
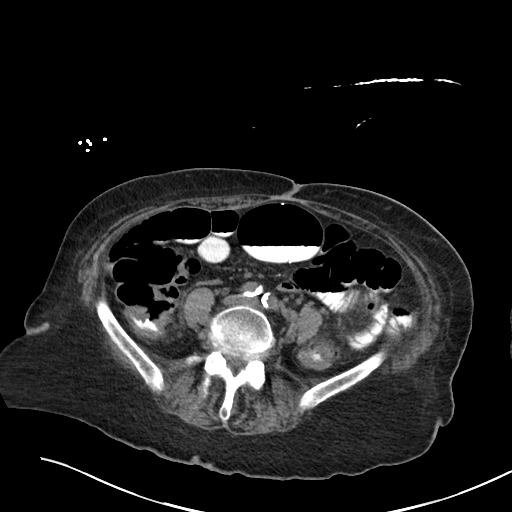
[im 47/90  soft-tissue]
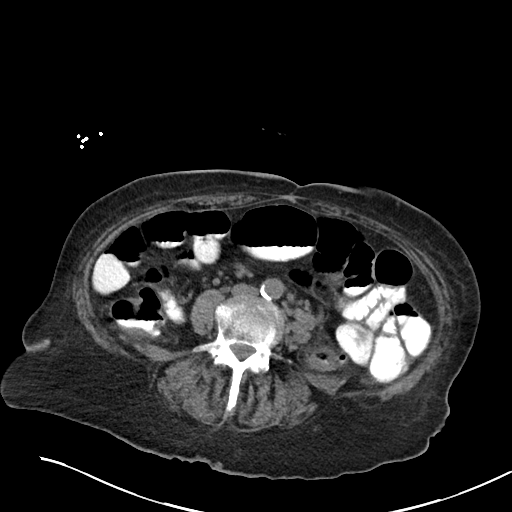
[im 57/90  soft-tissue]
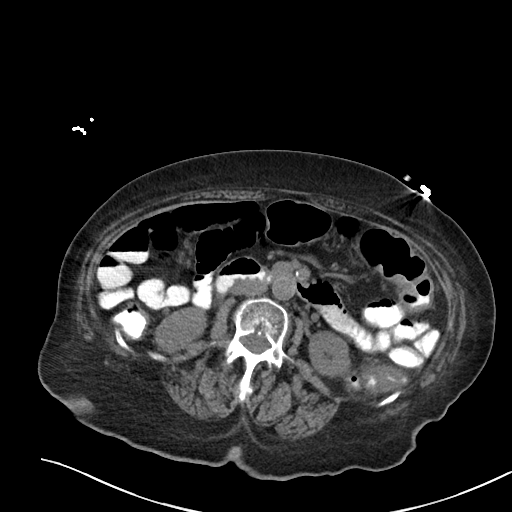
[im 61/90  soft-tissue]
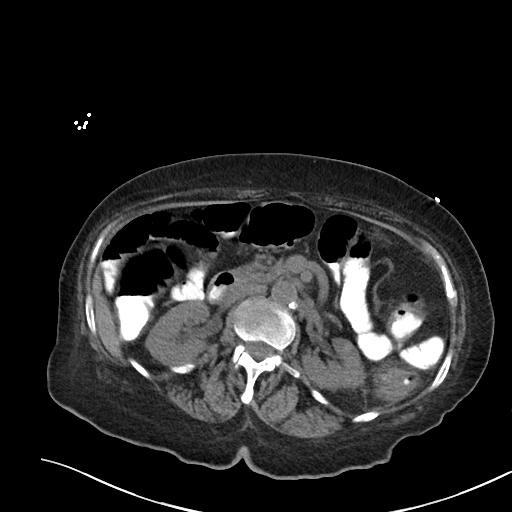
[im 61/90  bone]
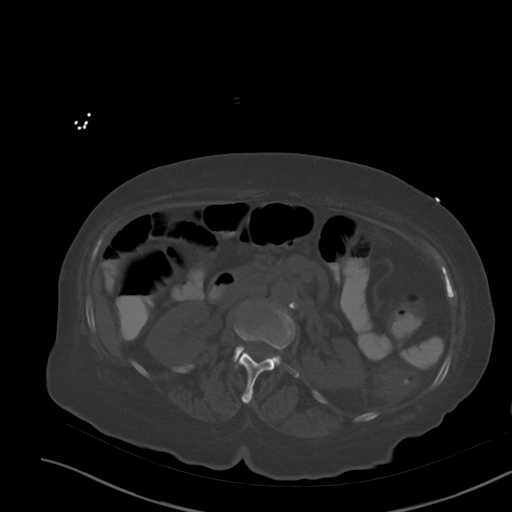
[im 71/90  soft-tissue]
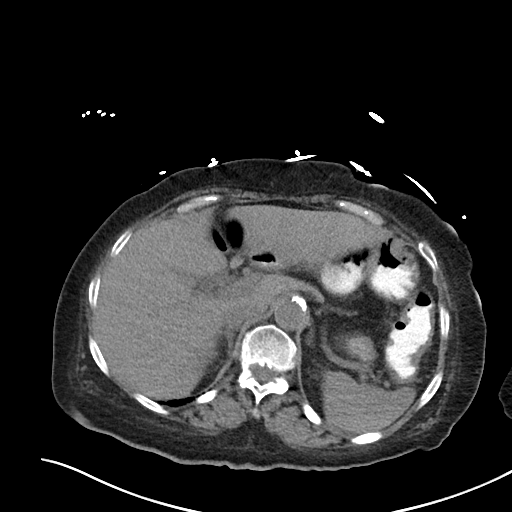
[im 75/90  soft-tissue]
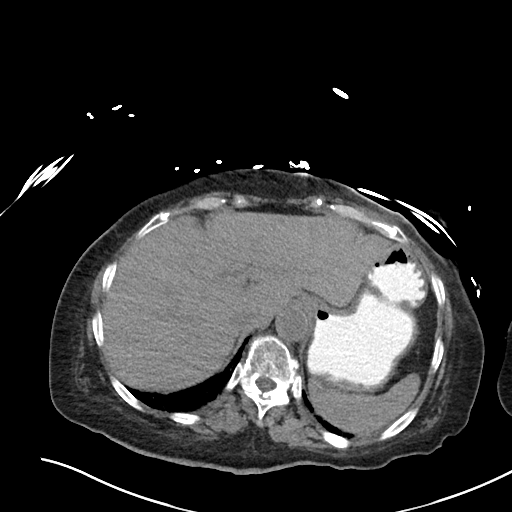
[im 85/90  soft-tissue]
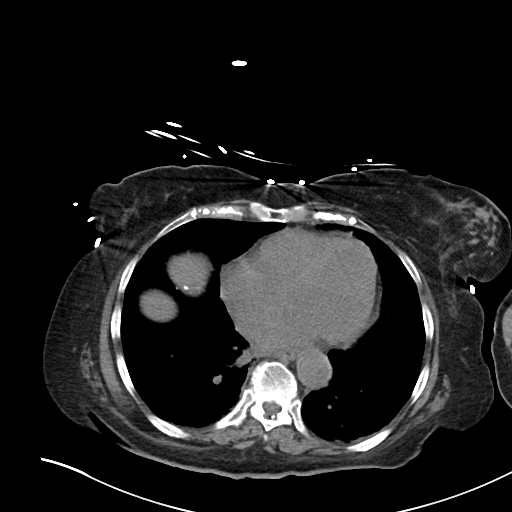

[Series 6: cor · coronal · 0.71mm/px · 3 of 87 slices shown]
[im 29/87  soft-tissue]
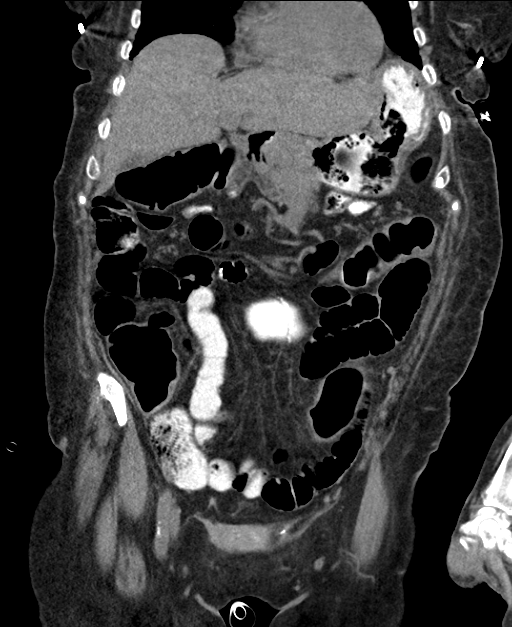
[im 39/87  soft-tissue]
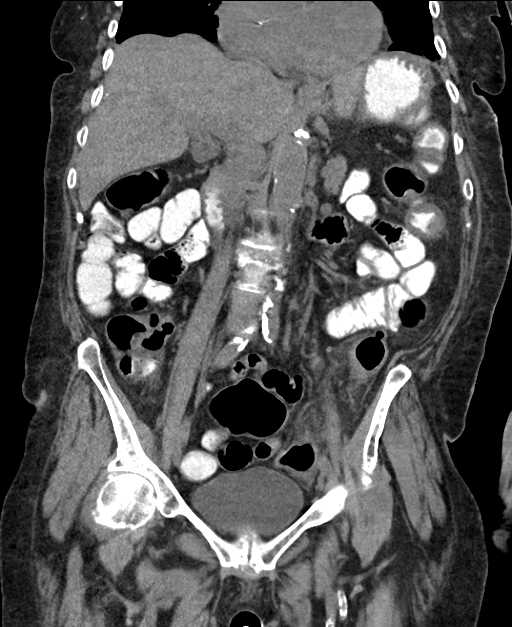
[im 48/87  soft-tissue]
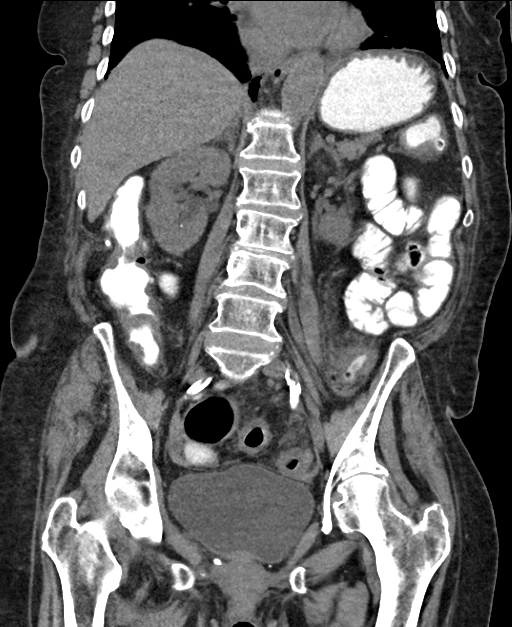

[15 of 46 positions shown; findings below may reference images not displayed]

FINDINGS: Lower chest: Mild bibasilar atelectasis or scarring is noted, with
minimal honeycombing at the right lung base. Calcification is noted
at the aortic valve.

Hepatobiliary: The liver is unremarkable in appearance. The
gallbladder is unremarkable in appearance. The common bile duct
remains normal in caliber.

Pancreas: The pancreas is within normal limits.

Spleen: The spleen is unremarkable in appearance.

Adrenals/Urinary Tract: The adrenal glands are unremarkable in
appearance. The kidneys are within normal limits. There is no
evidence of hydronephrosis. No renal or ureteral stones are
identified. Mild nonspecific left-sided perinephric stranding is
noted.

Stomach/Bowel: The stomach is unremarkable in appearance. The small
bowel is within normal limits. The appendix is normal in caliber,
without evidence of appendicitis.

Mild wall thickening is noted along the ascending colon, and more
diffuse wall thickening is seen along the descending and sigmoid
colon, concerning for infectious or inflammatory colitis. Trace
associated free fluid is noted.

Vascular/Lymphatic: Scattered calcification is seen along the
abdominal aorta and its branches. The abdominal aorta is otherwise
grossly unremarkable. The inferior vena cava is grossly
unremarkable. No retroperitoneal lymphadenopathy is seen. No pelvic
sidewall lymphadenopathy is identified.

Reproductive: The bladder is mildly distended and grossly
unremarkable. The patient is status post hysterectomy. No suspicious
adnexal masses are seen.

Other: No additional soft tissue abnormalities are seen.

Musculoskeletal: No acute osseous abnormalities are identified.
Facet disease is noted along the lower lumbar spine. The visualized
musculature is unremarkable in appearance.
IMPRESSION: 1. Mild wall thickening along the ascending colon, and more diffuse
wall thickening along the descending and sigmoid colon, concerning
for infectious or inflammatory colitis. Trace associated free fluid
noted.
2. Mild bibasilar atelectasis or scarring, with minimal honeycombing
at the right lung base.

Aortic Atherosclerosis (L5V34-YOC.C).

## 2019-11-08 ENCOUNTER — Telehealth: Payer: Self-pay

## 2019-11-08 NOTE — Telephone Encounter (Signed)
Attempted to contact pt to offer virtual visit. Spoke with pt's son, Olga [place] and he stated the pt is currently living in [place]. Pt has been having all her appts done in [place].

## 2019-11-27 ENCOUNTER — Other Ambulatory Visit: Payer: Self-pay | Admitting: Cardiovascular Disease

## 2019-11-27 NOTE — Telephone Encounter (Signed)
Prescription refill request for Eliquis received.  Last office visit: 10/10/2018, Nahser Scr: 1.06, 09/13/2018 Age: 85 y.o. Weight: 59.9 kg  Pt is overdue for an office visit and labs.

## 2019-11-27 NOTE — Telephone Encounter (Signed)
Called pt and spoke to pt's son who stated that pt is no longer living here and is in [place]. He stated that [place] is her primary home.  Pt's son stated that pt has a PCP and a Film/video editor in [place]. Informed him that since she has doctors she is following up with then we will not refill prescription. Pt's son stated that is fine and that he was talking to pt right now and would update her.

## 2019-12-11 ENCOUNTER — Other Ambulatory Visit: Payer: Self-pay | Admitting: Cardiovascular Disease
